# Patient Record
Sex: Female | Born: 1945 | Race: White | Hispanic: No | Marital: Married | Smoking: Former smoker
Health system: Southern US, Community
[De-identification: ages and names within clinical notes are randomized; demographics above are authoritative.]

## PROBLEM LIST (undated history)

## (undated) ENCOUNTER — Emergency Department (HOSPITAL_COMMUNITY): Admission: EM | Disposition: A | Discharge status: Home / Self Care | Payer: 59

## (undated) DIAGNOSIS — G4730 Sleep apnea, unspecified: Secondary | ICD-10-CM

## (undated) DIAGNOSIS — D4959 Neoplasm of unspecified behavior of other genitourinary organ: Secondary | ICD-10-CM

## (undated) DIAGNOSIS — J45909 Unspecified asthma, uncomplicated: Secondary | ICD-10-CM

## (undated) DIAGNOSIS — L309 Dermatitis, unspecified: Secondary | ICD-10-CM

## (undated) DIAGNOSIS — M1990 Unspecified osteoarthritis, unspecified site: Secondary | ICD-10-CM

## (undated) DIAGNOSIS — D219 Benign neoplasm of connective and other soft tissue, unspecified: Secondary | ICD-10-CM

## (undated) DIAGNOSIS — E119 Type 2 diabetes mellitus without complications: Secondary | ICD-10-CM

## (undated) DIAGNOSIS — D250 Submucous leiomyoma of uterus: Secondary | ICD-10-CM

## (undated) DIAGNOSIS — I10 Essential (primary) hypertension: Secondary | ICD-10-CM

## (undated) HISTORY — PX: PELVIC LAPAROSCOPY: SHX162

## (undated) HISTORY — DX: Sleep apnea, unspecified: G47.30

## (undated) HISTORY — PX: HYSTEROSCOPY: SHX211

## (undated) HISTORY — DX: Unspecified osteoarthritis, unspecified site: M19.90

## (undated) HISTORY — DX: Unspecified asthma, uncomplicated: J45.909

## (undated) HISTORY — DX: Dermatitis, unspecified: L30.9

## (undated) HISTORY — DX: Submucous leiomyoma of uterus: D25.0

## (undated) HISTORY — DX: Neoplasm of unspecified behavior of other genitourinary organ: D49.59

## (undated) HISTORY — DX: Benign neoplasm of connective and other soft tissue, unspecified: D21.9

## (undated) HISTORY — PX: HERNIA REPAIR: SHX51

## (undated) HISTORY — PX: DERMOID CYST  EXCISION: SHX1452

## (undated) HISTORY — DX: Type 2 diabetes mellitus without complications: E11.9

## (undated) HISTORY — DX: Essential (primary) hypertension: I10

---

## 2000-03-27 ENCOUNTER — Other Ambulatory Visit: Admission: RE | Admit: 2000-03-27 | Discharge: 2000-03-27 | Payer: Self-pay | Admitting: Obstetrics and Gynecology

## 2000-06-25 ENCOUNTER — Ambulatory Visit (HOSPITAL_COMMUNITY): Admission: RE | Admit: 2000-06-25 | Discharge: 2000-06-25 | Payer: Self-pay | Admitting: Obstetrics and Gynecology

## 2000-06-25 ENCOUNTER — Encounter (INDEPENDENT_AMBULATORY_CARE_PROVIDER_SITE_OTHER): Payer: Self-pay | Admitting: Specialist

## 2001-04-10 ENCOUNTER — Other Ambulatory Visit: Admission: RE | Admit: 2001-04-10 | Discharge: 2001-04-10 | Payer: Self-pay | Admitting: Obstetrics and Gynecology

## 2002-05-01 ENCOUNTER — Other Ambulatory Visit: Admission: RE | Admit: 2002-05-01 | Discharge: 2002-05-01 | Payer: Self-pay | Admitting: Obstetrics and Gynecology

## 2003-05-10 ENCOUNTER — Other Ambulatory Visit: Admission: RE | Admit: 2003-05-10 | Discharge: 2003-05-10 | Payer: Self-pay | Admitting: Obstetrics and Gynecology

## 2004-06-07 ENCOUNTER — Other Ambulatory Visit: Admission: RE | Admit: 2004-06-07 | Discharge: 2004-06-07 | Payer: Self-pay | Admitting: Obstetrics and Gynecology

## 2004-06-12 ENCOUNTER — Ambulatory Visit (HOSPITAL_COMMUNITY): Admission: RE | Admit: 2004-06-12 | Discharge: 2004-06-12 | Payer: Self-pay | Admitting: Family Medicine

## 2005-06-08 ENCOUNTER — Other Ambulatory Visit: Admission: RE | Admit: 2005-06-08 | Discharge: 2005-06-08 | Payer: Self-pay | Admitting: Addiction Medicine

## 2005-07-05 ENCOUNTER — Encounter: Admission: RE | Admit: 2005-07-05 | Discharge: 2005-07-05 | Payer: Self-pay | Admitting: Obstetrics and Gynecology

## 2005-09-07 ENCOUNTER — Ambulatory Visit (HOSPITAL_COMMUNITY): Admission: RE | Admit: 2005-09-07 | Discharge: 2005-09-07 | Payer: Self-pay | Admitting: Endocrinology

## 2005-10-12 ENCOUNTER — Encounter: Payer: Self-pay | Admitting: Obstetrics and Gynecology

## 2005-10-16 ENCOUNTER — Inpatient Hospital Stay (HOSPITAL_COMMUNITY): Admission: AD | Admit: 2005-10-16 | Discharge: 2005-10-17 | Payer: Self-pay | Admitting: Obstetrics and Gynecology

## 2005-10-16 ENCOUNTER — Encounter (INDEPENDENT_AMBULATORY_CARE_PROVIDER_SITE_OTHER): Payer: Self-pay | Admitting: *Deleted

## 2006-06-11 ENCOUNTER — Other Ambulatory Visit: Admission: RE | Admit: 2006-06-11 | Discharge: 2006-06-11 | Payer: Self-pay | Admitting: Obstetrics and Gynecology

## 2006-09-13 ENCOUNTER — Encounter (INDEPENDENT_AMBULATORY_CARE_PROVIDER_SITE_OTHER): Payer: Self-pay | Admitting: Specialist

## 2006-09-13 ENCOUNTER — Ambulatory Visit (HOSPITAL_COMMUNITY): Admission: RE | Admit: 2006-09-13 | Discharge: 2006-09-14 | Payer: Self-pay | Admitting: Obstetrics and Gynecology

## 2007-06-16 ENCOUNTER — Other Ambulatory Visit: Admission: RE | Admit: 2007-06-16 | Discharge: 2007-06-16 | Payer: Self-pay | Admitting: Obstetrics and Gynecology

## 2010-12-24 ENCOUNTER — Encounter: Payer: Self-pay | Admitting: Endocrinology

## 2011-04-03 ENCOUNTER — Ambulatory Visit (INDEPENDENT_AMBULATORY_CARE_PROVIDER_SITE_OTHER): Payer: 59 | Admitting: Obstetrics and Gynecology

## 2011-04-03 ENCOUNTER — Other Ambulatory Visit: Payer: Self-pay | Admitting: Obstetrics and Gynecology

## 2011-04-03 ENCOUNTER — Other Ambulatory Visit (HOSPITAL_COMMUNITY)
Admission: RE | Admit: 2011-04-03 | Discharge: 2011-04-03 | Disposition: A | Discharge status: Home / Self Care | Payer: 59 | Source: Ambulatory Visit | Attending: Obstetrics and Gynecology | Admitting: Obstetrics and Gynecology

## 2011-04-03 DIAGNOSIS — Z124 Encounter for screening for malignant neoplasm of cervix: Secondary | ICD-10-CM | POA: Insufficient documentation

## 2011-04-03 DIAGNOSIS — Z01419 Encounter for gynecological examination (general) (routine) without abnormal findings: Secondary | ICD-10-CM

## 2011-04-20 NOTE — Op Note (Signed)
Amy Riddle, Amy Riddle          ACCOUNT NO.:  [id number]   MEDICAL RECORD NO.:  [id number]          PATIENT TYPE:  AMB   LOCATION:  DAY                          FACILITY:  [facility]   PHYSICIAN:  Sharlet Salina T. Hoxworth, M.D.DATE OF BIRTH:  07/10/1946   DATE OF PROCEDURE:  09/13/2006  DATE OF DISCHARGE:                                 OPERATIVE REPORT   PREOPERATIVE DIAGNOSIS:  Ventral incisional hernia.   POSTOPERATIVE DIAGNOSIS:  Ventral incisional hernia.   SURGICAL PROCEDURES:  Laparoscopic repair of ventral incisional hernia.   SURGEON:  Dr. Johna Sheriff   ANESTHESIA:  General.   BRIEF HISTORY:  Amy Riddle is a 65 year old female with morbid obesity,  who is status post laparoscopic assisted oophorectomy in the last year.  Amy Riddle  now presents with an enlarging hernia mass at her Hasson trocar site just  above the umbilicus.  In addition, Amy Riddle has a moderate-sized umbilical hernia  just below Amy Riddle.  It is increasingly symptomatic, and we have recommended  laparoscopic repair.  The nature of the procedure; indications; risks  bleeding, infection, recurrence, bowel injury were discussed and understood.  Amy Riddle is now brought to the operating room for Amy Riddle procedure.   DESCRIPTION OF OPERATION:  Following completion of a D&C by Dr. Eda Paschal,  the abdomen was widely sterilely prepped and draped.  Amy Riddle received  preoperative IV antibiotics.  PAS were in place.  Correct patient and  procedure were verified.  Abdominal access was obtained with 11 mm OptiVu  trocar in the left flank without difficulty and pneumoperitoneum  established.  Under direct vision, two 5 mm trocars were placed in the left  upper quadrant and the left lower quadrant.  There was obvious tissue  incarcerated up into these hernias around the umbilicus and just above.  Using the harmonic scalpel superiorly, the hernia sac was carefully opened  at the edge of the peritoneum and the more superior larger hernia was found  to  contain several feet of small intestine that was easily brought down back  into the abdomen.  Further adhesions of fibrofatty tissue were taken down  around Amy Riddle edge and then omentum was reduced from the umbilical defect  immediately below Amy Riddle, further taken down with harmonic scalpel and the  entire anterior abdominal wall completely cleared.  Again noted were 2  adjacent defects, the superior one being somewhat larger at the incision  site, overall together measured about 7 x 4 cm.  A piece of Excel dual mesh  measuring 15 x 10 cm was chosen.  Four stay sutures were placed at the 12,  3, 6, and 9 o'clock positions.  Amy Riddle was introduced in the abdomen and  unfurled.  A centering suture had also been placed which was retrieved up  through the center of the hernia defect.  Four corresponding sites on the  peritoneum upon the abdominal wall were identified for the stay sutures and  using a suture grasper through small stab incisions, these were brought up  to the anterior abdominal wall with very nice wide deployment of the mesh  with excellent coverage and overlapped.  The sutures were tied  in place.  The Endo tacker was then used to secure the mesh circumferentially  with the second row more centrally.  Amy Riddle appeared to provide nice broad  coverage.  CO2 was evacuated and all trocars removed.  The sutures were  trimmed.  Skin incisions were closed with interrupted subcuticular 4-0  Monocryl and Steri-Strips.  Sponge, needle, and instrument counts correct.  The patient taken to recovery in good condition.      Lorne Skeens. Hoxworth, M.D.  Electronically Signed     BTH/MEDQ  D:  09/13/2006  T:  09/15/2006  Job:  [id number]

## 2011-04-20 NOTE — Discharge Summary (Signed)
Amy Riddle, Amy Riddle          ACCOUNT NO.:  [id number]   MEDICAL RECORD NO.:  [id number]          PATIENT TYPE:  INP   LOCATION:  9306                          FACILITY:  [facility]   PHYSICIAN:  Daniel L. Gottsegen, M.D.DATE OF BIRTH:  1946-05-28   DATE OF ADMISSION:  10/16/2005  DATE OF DISCHARGE:  10/17/2005                                 DISCHARGE SUMMARY   The patient is a 65 year old nulligravida, who presented to the office with  significantly increased hair growth.  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  Ultrasound and MRI  failed to reveal the source of the tumor.  Selective vein sampling for  testosterone levels confirm that this was an ovarian neoplasm, and she was  brought to the operating for laparoscopy with bilateral salpingo-  oophorectomy by Dr. Eda Paschal with Dr. Jamse Mead help because of her large  size, which was approximately 350 pounds.  The patient underwent a bilateral  salpingo-oophorectomy.  Surgery was successful laparoscopically, but she was  kept overnight to be sure that she did not have any problems, and on the  next day she was discharged.   DISCHARGE MEDICATIONS:  Vicodin for pain relief.   She will be seen in the office in four days for staple removal.   DISCHARGE DIET:  Regular.   DISCHARGE ACTIVITIES:  Ambulatory.   DISCHARGE CONDITION:  Improved.   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   OPERATION:  Laparoscopy with bilateral salpingo-oophorectomy.      Daniel L. Eda Paschal, M.D.  Electronically Signed     DLG/MEDQ  D:  11/08/2005  T:  11/08/2005  Job:  [id number]

## 2011-04-20 NOTE — Op Note (Signed)
[facility]   Patient:    Amy Riddle, Amy Riddle                 MRN: [id number] Proc. Date: 06/25/00 Adm. Date:  [id number] Disc. Date: [id number] Attending:  Amanda Cockayne CC:         Esmeralda Arthur, M.D.                           Operative Report  OPERATION:                    1. Resection of large endometrial fibroid.                               2. Hysteroscopy.  SURGEON:                      Esmeralda Arthur, M.D.  ANESTHESIA:                   General.  MEDIUM:                       Sorbitol.  DEFICIT:                      50 cc.  CATHETERS:                    None.  PACKS:                       None.  FINDINGS:                     Endometrial cavity which sounded 12.0 cm.  She had a large fibroid occupying the endometrial cavity.  She had a progressive resection. I then explored in further and I think we perforated the uterus, and did not explore it further.  We stopped the procedure.  She had no excess bleeding after watching for four minutes.  Her deficit was 50 cc.  DESCRIPTION OF PROCEDURE:     The patient was carried to the operating room and had satisfactory general anesthesia.  She was placed in the lithotomy position. She was prepped and draped in the sterile field.  The bladder was catheterized. A weighted speculum was placed in the posterior vagina.  The cervix could not be easily visualized.  A long weighted speculum had to be used.  The cervix was then grasped and the cervix dilated easily to a #25.  We put the scope in.  On observation, we saw a large fibroid.  We dilated her fairly easily to a #33. There was a little pressure with the #33, so dilated the cervix.  We then inserted the hysteroscope and we could see the large fibroid.  This occupied most of the endometrial cavity.  We began a slow dissection with the cutting cautery, and carried down.  I think we basically removed 90% of the fibroid.   There was endoscopic bleeding, and looking saw an area where I thought I saw bowel.  We stopped the procedure.  We had a deficit of 50 cc.  She had no excessive bleeding after the procedure.  The patient was then carried to the recovery room in good condition.  The patient had been counseled preoperatively in reference to possible perforation,  and infection, and understands. DD:  06/25/00 TD:  06/27/00 Job: [id number]

## 2011-04-20 NOTE — H&P (Signed)
Amy Riddle, Amy Riddle          ACCOUNT NO.:  [id number]   MEDICAL RECORD NO.:  [id number]          PATIENT TYPE:  AMB   LOCATION:  DAY                          FACILITY:  [facility]   PHYSICIAN:  Daniel L. Gottsegen, M.D.DATE OF BIRTH:  May 08, 1946   DATE OF ADMISSION:  10/16/2005  DATE OF DISCHARGE:                                HISTORY & PHYSICAL   CHIEF COMPLAINT:  Increased hair growth.   HISTORY OF PRESENT ILLNESS:  The patient is a 65 year old nulligravida, who  came to see me because of noticing increase of hair growth, both on her face  and on her abdomen.  On examination, there was significant hair growth.  As  a result of this, a testosterone was done which was significantly elevated.  She then had an ultrasound looking for an ovarian source of her  significantly increased hair growth.  Ultrasound was not helpful.  She then  underwent an MRI which did show a slight difference in size between the  right and the left ovary, although no absolute tumor could be seen.  Her  DHEAS was normal which also led [place] to believe that the source of her  increased testosterone was a tumor of the ovary.  Because of her weight and  concern about operating on someone even with a very elevated testosterone  without having documentation, she was referred to Dr. Dagoberto Ligas who went ahead  and had her studied for selective vein, both ovary and adrenal selective  vein sampling for testosterone levels, and studies confirmed that the source  of her increased testosterone was her ovaries.  As a result of this, she now  enters the hospital for a bilateral salpingo-oophorectomy.  There will be an  attempt to do this laparoscopically; however, she knows that she may require  an incision.  We have decided to remove both her ovaries because she is  postmenopausal and when selective vein sampling was done, they could not get  into the right ovarian vein, so there is still some question as to whether  the source  of the increased hair growth is her right or left ovary, but it  is clearly ovarian.   PAST MEDICAL HISTORY:  She had a dermoid removed by Dr. Verlon Au many  years ago.  It was on her right ovary, and a cystectomy was done.  She then  developed an incisional hernia.  Those are her only surgeries.   PRESENT MEDICATIONS:  Protopic for eczema, vitamin E, Advair for her asthma,  calcium and ibuprofen for arthritis.   MEDICAL PROBLEMS:  As noted are asthma and eczema.   FAMILY HISTORY:  She has an uncle who is diabetic.  She has a brother and a  grandmother that are hypertensive.  Her maternal aunt or cousin had breast  cancer, and her father had pancreatic cancer.   REVIEW OF SYSTEMS:  HEENT:  Negative except for headaches.  CARDIOVASCULAR:  Negative.  RESPIRATORY:  Negative.  GI:  Negative.  MUSCULOSKELETAL:  Negative.  GU:  Negative.  NEUROLOGICAL AND PSYCHIATRIC:  Negative.  ALLERGIC/IMMUNOLOGICAL/LYMPHATIC/ENDOCRINE:  Negative except as noted with  the hirsutism.  PHYSICAL EXAMINATION:  GENERAL:  The patient is a well-developed but very  heavy white female in no acute distress.  VITAL SIGNS:  Blood pressure is 140/84.  Pulse is 80 and regular.  Respirations 16 and nonlabored.  She is afebrile.  HEENT:  All within normal limits.  NECK:  Supple, trachea in the midline.  Thyroid is not enlarged.  LUNGS:  Clear to P&A.  HEART:  No thrills, heaves, or murmurs.  BREASTS:  No masses.  ABDOMEN:  Soft without guarding, rebound or masses.  PELVIC:  External and vaginal are normal.  Cervix is clean.  Pap smear shows  no atypia.  Pelvic examination is very limited because of her size.  No  masses are appreciated, either on the uterus or her adnexa.  Rectovaginal is  confirmatory.  EXTREMITIES:  Within normal limits.   ADMISSION IMPRESSION:  Ovarian neoplasm producing testosterone.   PLAN:  See above.      Daniel L. Eda Paschal, M.D.  Electronically Signed     DLG/MEDQ  D:   10/17/2005  T:  10/17/2005  Job:  [id number]

## 2011-04-20 NOTE — Op Note (Signed)
NAMECARMAN, AUXIER          ACCOUNT NO.:  [id number]   MEDICAL RECORD NO.:  [id number]          PATIENT TYPE:  INP   LOCATION:  9306                          FACILITY:  [facility]   PHYSICIAN:  Daniel L. Gottsegen, M.D.DATE OF BIRTH:  1946/08/28   DATE OF PROCEDURE:  10/16/2005  DATE OF DISCHARGE:                                 OPERATIVE REPORT   PREOPERATIVE DIAGNOSIS:  Ovarian neoplasm producing testosterone.   POSTOPERATIVE DIAGNOSIS:  Ovarian neoplasm producing testosterone.   OPERATIONS:  Diagnostic laparoscopy, followed by lysis adhesions, followed  by bilateral salpingo-oophorectomy.   SURGEON:  Dr. Eda Paschal   FIRST ASSISTANT:  Dr. Johna Sheriff   INDICATIONS:  The patient is a 65 year old female, who had presented to the  office with increased hair growth.  The patient's testosterone level was in  the tumor level; however, on screening, no definitive neoplasm could be  seen, although on MR she had a slightly enlarged right ovary.  As a result  of this, selective vein sampling was done confirming that this was an  ovarian producing tumor and not an adrenal producing tumor.  As a result of  this, she is taken to the operating room for excision of both ovaries.  It  was impossible to be sure whether this was from her right ovary or left  ovary because both ovarian veins could not be sampled, just the left ovarian  vein and above and below the vena cava.   FINDINGS:  At the time of surgery, the patient had omental adhesions from  her previous pelvic surgery.  Once these were taken down, her uterus was  slightly enlarged and boggy, consistent with either a small leiomyoma or  adenomyosis.  The right ovary was enlarged, especially for a postmenopausal  ovary.  It had a smooth surface, but it appeared to have something in it.  The left ovary was also just slightly enlarged, not as enlarged as her right  ovary was.  The fallopian tubes were normal.  Pelvic peritoneum was free of  any disease.   PROCEDURE:  After adequate general endotracheal anesthesia, the patient was  placed in the supine position, prepped and draped in the usual sterile  manner.  A Hulka catheter was inserted in the patient's uterus.  A Foley  catheter was inserted in the patient's bladder.  Dr. Johna Sheriff had scrubbed  in because of his experience with operating on significantly overweight  women with need for laparoscopic surgery.  Choices considered were either  using the OptiVu in the left upper quadrant or doing an open laparoscopy  above the umbilicus, and we elected to do the latter.  She had what appeared  to be an umbilical hernia, and it was felt better to go above that side for  that reason.  A vertical incision was made.  It was taken down to the fascia  which was grasped.  The fascia was opened.  The peritoneum was entered  without difficulty.  A 0 Vicryl pursestring suture was placed to keep the  [facility] catheter in place, and then the diagnostic laparoscope was put  through that and a pneumoperitoneum was  created.  Initially, two 5 mm ports  were placed but because of the difficulty in keeping bowel way from the  adnexal structures and also elevating the uterus and the adnexal structures  enough to see them, she ended up having four 5 mm ports placed in addition  to the one placed above the umbilicus in the midline; one was in the left  lower quadrant; one was to the right of the umbilicus; one was in the  midline suprapubically, and one was in the right lower quadrant.  A variety  of instrumentation was placed through this.  Peritoneal washings were  obtained.  Using a grasper to keep the omentum on stretch and using the  scissors with unilateral current, all adhesions were taken down without  difficulty.  This allowed [place] to see the pelvis much better.  Bleeding was  controlled with a bipolar.  Following this, the right adnexa could be  identified, although it took some work to get  it elevated enough away from  bowel, but it was done, although we could not identify the ureter on the  right.  Using a tripolar staying very close the ovary and hugging it, was  felt that we were far from the ureter.  Bipolaring and cutting, the right  ovary and tube were separated both from the IP ligament and the uterus  without any bleeding being noted.  Attention was next turned to left side,  and the left ovary and tube were handled in a similar fashion.  Once again  the ureter could not be identified, but the ovary was hugged all the way  down, and it was felt it was not injured.  Both adnexa were now free without  bleeding.  Copious irrigation was done with sterile saline, and no bleeding  was found.  The urine remained clear and copious.  An Endopouch was placed  through the 10 mm incision, and both ovaries and tubes were removed and sent  to pathology for tissue diagnosis.  It clearly looked like her tumor was on  the right ovary, and then the pneumoperitoneum was evacuated.  The Hasson 0  Vicryl suture was tied in place to prevent a hernia, and then staples were  used on the other incisions except for the one in the left lower quadrant.  There was some fairly significant venous bleeding from that.  A large needle  with a 2-0 plain was used in a figure-of-eight, stopped the bleeding  completely, and that will be kept in place for a while.  Estimated blood  loss for the entire procedure was 100 mL with none replaced.  The patient  tolerated the procedure well, left the operating room in satisfactory  condition draining clear urine from her Foley catheter.      Daniel L. Eda Paschal, M.D.  Electronically Signed     DLG/MEDQ  D:  10/16/2005  T:  10/16/2005  Job:  [id number]

## 2011-04-20 NOTE — Op Note (Signed)
[name], Amy Riddle          ACCOUNT NO.:  [id number]   MEDICAL RECORD NO.:  [id number]          PATIENT TYPE:  AMB   LOCATION:  DAY                          FACILITY:  [facility]   PHYSICIAN:  Daniel L. Gottsegen, M.D.DATE OF BIRTH:  [birth year]   DATE OF PROCEDURE:  09/13/2006  DATE OF DISCHARGE:                                 OPERATIVE REPORT   PREOPERATIVE DIAGNOSIS:  Postmenopausal bleeding.   POSTOPERATIVE DIAGNOSIS:  Post menopausal bleeding plus submucous myoma.   OPERATION:  Hysteroscopy with endometrial sampling.   SURGEON:  Dr. Eda Paschal   ANESTHESIA:  General.   INDICATIONS:  The patient is a 65 year old nulligravida, who had presented  to the office with postmenopausal bleeding.  She had had 2 episodes at that  point, and she has had another once since.  She underwent an ultrasound in  the office.  She had some small fibroids, but they did obscure the  endometrial cavity, so we could not measure endometrial stripe, and it was  not possible to do endometrial sampling in the office due to cervical  stenosis.  She now enters the hospital for the above.   FINDINGS:  Externals normal.  BUS is normal.  Vaginal is normal.  Cervix is  clean.  Uterus appears to be top normal size and shape.  You really could  not evaluate her adnexa because of her size, but she is status post  laparoscopic BSO.   PROCEDURE:  After adequate general anesthesia, the patient was placed in the  dorsal supine position, prepped and draped in the usual sterile manner.  A  single-tooth tenaculum was placed in the anterior lip of the cervix.  The  patient was dilated to #23 [facility] dilator.  Endometrial sampling was done  with a Pipelle curette, and then the diagnostic hysteroscope was introduced.  Three percent sorbitol was used to expand the intrauterine cavity, and a  camera was used for magnification.  The patient did have a submucous myoma  at the top of the fundus; however, it was not confined  to the endometrial  cavity, and a younger person who was still menstruating and whose size was  not as much of a surgical challenge, some thought might have been taken to  resecting it and hoping to be able to resect it into the wall of the  myometrium but because of fear of bleeding that would require laparotomy to  control and her postmenopausal state and the fact it was a benign-looking  submucous myoma not confined to the cavity, it was felt that the prudent  choice was to leave it.  The procedure was terminated.  Blood loss was  minimal; fluid deficit was minimal.  Following this, Dr. Johna Sheriff was going  to do an incisional hernia repair.      Daniel L. Eda Paschal, M.D.  Electronically Signed    DLG/MEDQ  D:  09/13/2006  T:  09/15/2006  Job:  [id number]

## 2013-03-19 ENCOUNTER — Encounter: Payer: Self-pay | Admitting: Anesthesiology

## 2013-03-23 ENCOUNTER — Encounter: Payer: 59 | Admitting: Gynecology

## 2013-03-26 ENCOUNTER — Encounter: Payer: Self-pay | Admitting: Anesthesiology

## 2013-03-26 ENCOUNTER — Encounter: Payer: Self-pay | Admitting: Gynecology

## 2013-03-26 ENCOUNTER — Ambulatory Visit (INDEPENDENT_AMBULATORY_CARE_PROVIDER_SITE_OTHER): Payer: 59 | Admitting: Gynecology

## 2013-03-26 VITALS — BP 128/80 | Ht 63.0 in | Wt 304.0 lb

## 2013-03-26 DIAGNOSIS — Z79890 Hormone replacement therapy (postmenopausal): Secondary | ICD-10-CM

## 2013-03-26 DIAGNOSIS — D259 Leiomyoma of uterus, unspecified: Secondary | ICD-10-CM

## 2013-03-26 DIAGNOSIS — E119 Type 2 diabetes mellitus without complications: Secondary | ICD-10-CM

## 2013-03-26 DIAGNOSIS — E663 Overweight: Secondary | ICD-10-CM

## 2013-03-26 DIAGNOSIS — I10 Essential (primary) hypertension: Secondary | ICD-10-CM

## 2013-03-26 DIAGNOSIS — R232 Flushing: Secondary | ICD-10-CM

## 2013-03-26 MED ORDER — PAROXETINE HCL 10 MG PO TABS: ORAL_TABLET | ORAL | Status: DC

## 2013-03-26 NOTE — Progress Notes (Signed)
Amy Riddle [birth year] [id number]   History:    67 y.o.  for annual gyn exam who has not been seen in the office since 2012. Patient with past history laparoscopic bilateral salpingo-oophorectomy with the following pathology report:  1. 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 SEE COMMENT. - FALLOPIAN TUBE: BENIGN FALLOPIAN TUBE.  2. LEFT OVARY AND FALLOPIAN TUBE: - OVARY: BENIGN OVARY WITH SURFACE ADHESIONS. - FALLOPIAN TUBE: BENIGN FALLOPIAN TUBE.  Patient several years prior she had bilateral ovarian cystectomy for dermoid cyst.  Patient is morbidly obese has been followed by Dr. Jeannetta Nap for her type 2 diabetes and hypertension and has been doing her lab work. Review of her record also indicated that in 2001 she had resectoscopic myomectomy. She does have history of fibroid uterus. Patient also has had incisional hernia repairs x2. Her last mammogram was in 2012. Patient has not had a colonoscopy as of yet. She was complaining today of vasomotor symptoms. She was taking estrogen over-the-counter and she stopped that several months ago. She does not like to take any medication.   Past medical history,surgical history, family history and social history were all reviewed and documented in the EPIC chart.  Gynecologic History No LMP recorded. Patient is postmenopausal. Contraception: post menopausal status Last Pap: 2012. Results were: normal Last mammogram: 2012. Results were: normal  Obstetric History OB History   Grav Para Term Preterm Abortions TAB SAB Ect Mult Living   0                ROS: A ROS was performed and pertinent positives and negatives are included in the history.  GENERAL: No fevers or chills. HEENT: No change in vision, no earache, sore throat or sinus congestion. NECK: No pain or stiffness. CARDIOVASCULAR: No chest pain or pressure. No palpitations. PULMONARY: No shortness of breath, cough or wheeze. GASTROINTESTINAL: No  abdominal pain, nausea, vomiting or diarrhea, melena or bright red blood per rectum. GENITOURINARY: No urinary frequency, urgency, hesitancy or dysuria. MUSCULOSKELETAL: No joint or muscle pain, no back pain, no recent trauma. DERMATOLOGIC: No rash, no itching, no lesions. ENDOCRINE: No polyuria, polydipsia, no heat or cold intolerance. No recent change in weight. HEMATOLOGICAL: No anemia or easy bruising or bleeding. NEUROLOGIC: No headache, seizures, numbness, tingling or weakness. PSYCHIATRIC: No depression, no loss of interest in normal activity or change in sleep pattern.     Exam: chaperone present  BP 128/80  Ht 5\' 3"  (1.6 m)  Wt 304 lb (137.893 kg)  BMI 53.86 kg/m2  Body mass index is 53.86 kg/(m^2).  General appearance : Well developed well nourished female. No acute distress HEENT: Neck supple, trachea midline, no carotid bruits, no thyroidmegaly Lungs: Clear to auscultation, no rhonchi or wheezes, or rib retractions  Heart: Regular rate and rhythm, no murmurs or gallops Breast:Examined in sitting and supine position were symmetrical in appearance, no palpable masses or tenderness,  no skin retraction, no nipple inversion, no nipple discharge, no skin discoloration, no axillary or supraclavicular lymphadenopathy Abdomen: no palpable masses or tenderness, no rebound or guarding Extremities: no edema or skin discoloration or tenderness  Pelvic:  Bartholin, Urethra, Skene Glands: Within normal limits             Vagina: No gross lesions or discharge  Cervix: No gross lesions or discharge  Uterus  Limited exam due to patient's abdominal girth, normal size  Adnexa limited due to patient's abdominal girth Anus and perineum  normal  Rectovaginal  normal sphincter tone without palpated masses or tenderness             Hemoccult Hemoccult cards will be provided at next visit     Assessment/Plan:  67 y.o. female for annual exam morbidly obese making it difficult to evaluate her  uterus. Patient with past history of fibroid uterus. She will return back to the office in one to 2 weeks for an ultrasound. She scheduled for mammogram next month and will have her bone density done at the same time. Her shingles, Tdap and Pneumovax vaccine are up-to-date. We are going to give her the name of gastroenterologist for her to schedule a colonoscopy. For vasomotor symptoms she is going to be placed on Paxil 10 mg which she will break half a tablet and take only half a tablet before bedtime. She can start back on the estroven if she wants to. We discussed importance of calcium vitamin D and regular exercise for osteoporosis prevention. No Pap smear done today. Her last Pap smear will be next year.    Ok Edwards MD, 12:07 PM 03/26/2013

## 2013-03-26 NOTE — Patient Instructions (Addendum)

## 2013-03-27 ENCOUNTER — Encounter: Payer: Self-pay | Admitting: Obstetrics and Gynecology

## 2013-04-22 ENCOUNTER — Ambulatory Visit (INDEPENDENT_AMBULATORY_CARE_PROVIDER_SITE_OTHER): Payer: 59 | Admitting: Gynecology

## 2013-04-22 ENCOUNTER — Encounter: Payer: Self-pay | Admitting: Gynecology

## 2013-04-22 ENCOUNTER — Ambulatory Visit (INDEPENDENT_AMBULATORY_CARE_PROVIDER_SITE_OTHER): Payer: 59

## 2013-04-22 DIAGNOSIS — N950 Postmenopausal bleeding: Secondary | ICD-10-CM

## 2013-04-22 DIAGNOSIS — L919 Hypertrophic disorder of the skin, unspecified: Secondary | ICD-10-CM

## 2013-04-22 DIAGNOSIS — D251 Intramural leiomyoma of uterus: Secondary | ICD-10-CM

## 2013-04-22 DIAGNOSIS — N852 Hypertrophy of uterus: Secondary | ICD-10-CM

## 2013-04-22 DIAGNOSIS — E663 Overweight: Secondary | ICD-10-CM

## 2013-04-22 DIAGNOSIS — D259 Leiomyoma of uterus, unspecified: Secondary | ICD-10-CM

## 2013-04-22 DIAGNOSIS — E65 Localized adiposity: Secondary | ICD-10-CM

## 2013-04-22 NOTE — Patient Instructions (Addendum)
Endometrial Biopsy This is a test in which a tissue sample (a biopsy) is taken from inside the uterus (womb). It is then looked at by a specialist under a microscope to see if the tissue is normal or abnormal. The endometrium is the lining of the uterus. This test helps determine where you are in your menstrual cycle and how hormone levels are affecting the lining of the uterus. Another use for this test is to diagnose endometrial cancer, tuberculosis, polyps, or inflammatory conditions and to evaluate uterine bleeding. PREPARATION FOR TEST No preparation or fasting is necessary. NORMAL FINDINGS No pathologic conditions. Presence of "secretory-type" endometrium 3 to 5 days before to normal menstruation. Ranges for normal findings may vary among different laboratories and hospitals. You should always check with your doctor after having lab work or other tests done to discuss the meaning of your test results and whether your values are considered within normal limits. MEANING OF TEST  Your caregiver will go over the test results with you and discuss the importance and meaning of your results, as well as treatment options and the need for additional tests if necessary. OBTAINING THE TEST RESULTS It is your responsibility to obtain your test results. Ask the lab or department performing the test when and how you will get your results. Document Released: 03/22/2005 Document Revised: 02/11/2012 Document Reviewed: 10/29/2008 [employer] Patient Information 2014 [employer].  

## 2013-04-22 NOTE — Progress Notes (Signed)
The patient presented to the office today for an ultrasound since she was seen the office on April 24 limiting full pelvic exam due to the fact the patient is morbidly obese. Patient with past history laparoscopic bilateral salpingo-oophorectomy with the following pathology report:  1. 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 SEE COMMENT. - FALLOPIAN TUBE: BENIGN FALLOPIAN TUBE.  2. LEFT OVARY AND FALLOPIAN TUBE: - OVARY: BENIGN OVARY WITH SURFACE ADHESIONS. - FALLOPIAN TUBE: BENIGN FALLOPIAN TUBE.  Patient several years prior she had bilateral ovarian cystectomy for dermoid cyst.   Patient with known history of fibroid uterus. Patient's last ultrasound was in 2008.  Patient's informing every 4 months she has some brownish discharge but has not told anyone until now.  Ultrasound today: Uterus measured 11.5 x 10.7 x 3.1 cm with endometrial stripe of 2.7 mm. Suboptimal images due to pendulous abdomen. Intramural fibroid measuring 5.2 x 4.3 cm, 5.4 x 4.7 cm cervical fibroid measuring 2.3 x 2.8 cm questionable endometrial cavity seemed lower uterine segment with fluid within the cavity. A vascular. Unable to identify the endometrium in the fundus.  Assessment/plan review from previous ultrasound demonstrates no change in fibroid size. I had recommended we do an endometrial biopsy due to the fact that she had inform me that she's had some spotting every 3-4 months. Patient states she would like to return next month to have the endometrial biopsy. Patient was reminded to schedule her overdue colonoscopy since she's never had one and also her bone density study.

## 2013-05-04 ENCOUNTER — Encounter: Payer: Self-pay | Admitting: Gynecology

## 2013-05-05 ENCOUNTER — Encounter: Payer: Self-pay | Admitting: Gynecology

## 2013-05-15 ENCOUNTER — Encounter: Payer: Self-pay | Admitting: Gynecology

## 2013-05-15 ENCOUNTER — Ambulatory Visit (INDEPENDENT_AMBULATORY_CARE_PROVIDER_SITE_OTHER): Payer: 59 | Admitting: Gynecology

## 2013-05-15 DIAGNOSIS — N952 Postmenopausal atrophic vaginitis: Secondary | ICD-10-CM

## 2013-05-15 DIAGNOSIS — N882 Stricture and stenosis of cervix uteri: Secondary | ICD-10-CM

## 2013-05-15 DIAGNOSIS — N950 Postmenopausal bleeding: Secondary | ICD-10-CM

## 2013-05-15 MED ORDER — NONFORMULARY OR COMPOUNDED ITEM: Status: DC

## 2013-05-15 NOTE — Patient Instructions (Addendum)

## 2013-05-15 NOTE — Progress Notes (Signed)
Patient is a 67 year old who was seen in the office in April of this year for her annual exam but pelvic exam was limited due to the patient's BMI (morbidly obese). Patient's ultrasound demonstrated the following:  Uterus measured 11.5 x 10.7 x 3.1 cm with endometrial stripe of 2.7 mm. Suboptimal images due to pendulous abdomen. Intramural fibroid measuring 5.2 x 4.3 cm, 5.4 x 4.7 cm cervical fibroid measuring 2.3 x 2.8 cm questionable endometrial cavity seemed lower uterine segment with fluid within the cavity. A vascular. Unable to identify the endometrium in the fundus.  Patient does have a past history of laparoscopic bilateral salpingo-oophorectomy with pathology report as follows:  1. 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 SEE COMMENT. - FALLOPIAN TUBE: BENIGN FALLOPIAN TUBE.  2. LEFT OVARY AND FALLOPIAN TUBE: - OVARY: BENIGN OVARY WITH SURFACE ADHESIONS. - FALLOPIAN TUBE: BENIGN FALLOPIAN TUBE.  Patient several years prior she had bilateral ovarian cystectomy for dermoid cyst.   She had voiced that occasionally she might note a slight brownish discharge from her vagina so she was asked to come in today for a possible endometrial biopsy.  An attempted endometrial biopsy was unsuccessful due to patient being uncomfortable and stenotic cervical R. So procedure was aborted. We discussed that possibly the slight pinkish discharge and she is noted may have been attributed to vaginal atrophy because her vaginal mucosa was friable contact. She is scheduled for a colonoscopy in the next few weeks. Since her endometrial stripe was less than 5 mm this is reassuring to follow closely. She will be started on estradiol 0.02% 1 mL prefilled applicators to apply intravaginally twice a week. If she has any further form of bleeding she will report to the office and we may need to consider outpatient diagnostic hysteroscopy and D&C. We'll rescan her in one year in measure her  endometrial stripe.

## 2015-02-28 ENCOUNTER — Other Ambulatory Visit (HOSPITAL_BASED_OUTPATIENT_CLINIC_OR_DEPARTMENT_OTHER): Payer: Self-pay | Admitting: Orthopaedic Surgery

## 2015-03-18 ENCOUNTER — Encounter (HOSPITAL_BASED_OUTPATIENT_CLINIC_OR_DEPARTMENT_OTHER): Payer: Self-pay | Admitting: *Deleted

## 2015-03-21 ENCOUNTER — Encounter (HOSPITAL_BASED_OUTPATIENT_CLINIC_OR_DEPARTMENT_OTHER)
Admission: RE | Admit: 2015-03-21 | Discharge: 2015-03-21 | Disposition: A | Discharge status: Home / Self Care | Payer: 59 | Source: Ambulatory Visit | Attending: Orthopaedic Surgery | Admitting: Orthopaedic Surgery

## 2015-03-21 ENCOUNTER — Other Ambulatory Visit: Payer: Self-pay

## 2015-03-21 DIAGNOSIS — G5602 Carpal tunnel syndrome, left upper limb: Secondary | ICD-10-CM | POA: Diagnosis present

## 2015-03-21 DIAGNOSIS — L309 Dermatitis, unspecified: Secondary | ICD-10-CM | POA: Diagnosis not present

## 2015-03-21 DIAGNOSIS — Z79899 Other long term (current) drug therapy: Secondary | ICD-10-CM | POA: Diagnosis not present

## 2015-03-21 DIAGNOSIS — I10 Essential (primary) hypertension: Secondary | ICD-10-CM | POA: Diagnosis not present

## 2015-03-21 DIAGNOSIS — E119 Type 2 diabetes mellitus without complications: Secondary | ICD-10-CM | POA: Diagnosis not present

## 2015-03-21 DIAGNOSIS — Z7952 Long term (current) use of systemic steroids: Secondary | ICD-10-CM | POA: Diagnosis not present

## 2015-03-21 DIAGNOSIS — Z87891 Personal history of nicotine dependence: Secondary | ICD-10-CM | POA: Diagnosis not present

## 2015-03-21 DIAGNOSIS — Z791 Long term (current) use of non-steroidal anti-inflammatories (NSAID): Secondary | ICD-10-CM | POA: Diagnosis not present

## 2015-03-21 DIAGNOSIS — J45909 Unspecified asthma, uncomplicated: Secondary | ICD-10-CM | POA: Diagnosis not present

## 2015-03-21 DIAGNOSIS — M1990 Unspecified osteoarthritis, unspecified site: Secondary | ICD-10-CM | POA: Diagnosis not present

## 2015-03-21 DIAGNOSIS — Z9889 Other specified postprocedural states: Secondary | ICD-10-CM | POA: Diagnosis not present

## 2015-03-21 DIAGNOSIS — Z6841 Body Mass Index (BMI) 40.0 and over, adult: Secondary | ICD-10-CM | POA: Diagnosis not present

## 2015-03-21 DIAGNOSIS — Z7951 Long term (current) use of inhaled steroids: Secondary | ICD-10-CM | POA: Diagnosis not present

## 2015-03-21 LAB — BASIC METABOLIC PANEL
ANION GAP: 12 (ref 5–15)
BUN: 12 mg/dL (ref 6–23)
CO2: 31 mmol/L (ref 19–32)
Calcium: 9.3 mg/dL (ref 8.4–10.5)
Chloride: 97 mmol/L (ref 96–112)
Creatinine, Ser: 0.7 mg/dL (ref 0.50–1.10)
GFR calc Af Amer: >90 mL/min (ref >90)
GFR, EST NON AFRICAN AMERICAN: 88 mL/min — AB (ref >90)
Glucose, Bld: 168 mg/dL — ABNORMAL HIGH (ref 70–99)
Potassium: 4.7 mmol/L (ref 3.5–5.1)
Sodium: 140 mmol/L (ref 135–145)

## 2015-03-21 NOTE — Progress Notes (Signed)
Dr. Al Corpus examined pt's airway for surgery ( anesthesia consult) - [place] for surgery.

## 2015-03-23 ENCOUNTER — Ambulatory Visit (HOSPITAL_BASED_OUTPATIENT_CLINIC_OR_DEPARTMENT_OTHER): Payer: 59 | Admitting: Anesthesiology

## 2015-03-23 ENCOUNTER — Encounter (HOSPITAL_BASED_OUTPATIENT_CLINIC_OR_DEPARTMENT_OTHER): Payer: Self-pay | Admitting: Anesthesiology

## 2015-03-23 ENCOUNTER — Encounter (HOSPITAL_BASED_OUTPATIENT_CLINIC_OR_DEPARTMENT_OTHER)
Admission: RE | Disposition: A | Discharge status: Home / Self Care | Payer: Self-pay | Source: Ambulatory Visit | Attending: Orthopaedic Surgery

## 2015-03-23 ENCOUNTER — Ambulatory Visit (HOSPITAL_BASED_OUTPATIENT_CLINIC_OR_DEPARTMENT_OTHER)
Admission: RE | Admit: 2015-03-23 | Discharge: 2015-03-23 | Disposition: A | Discharge status: Home / Self Care | Payer: 59 | Source: Ambulatory Visit | Attending: Orthopaedic Surgery | Admitting: Orthopaedic Surgery

## 2015-03-23 DIAGNOSIS — G5602 Carpal tunnel syndrome, left upper limb: Secondary | ICD-10-CM | POA: Insufficient documentation

## 2015-03-23 DIAGNOSIS — E119 Type 2 diabetes mellitus without complications: Secondary | ICD-10-CM | POA: Insufficient documentation

## 2015-03-23 DIAGNOSIS — Z7951 Long term (current) use of inhaled steroids: Secondary | ICD-10-CM | POA: Insufficient documentation

## 2015-03-23 DIAGNOSIS — Z791 Long term (current) use of non-steroidal anti-inflammatories (NSAID): Secondary | ICD-10-CM | POA: Insufficient documentation

## 2015-03-23 DIAGNOSIS — Z79899 Other long term (current) drug therapy: Secondary | ICD-10-CM | POA: Insufficient documentation

## 2015-03-23 DIAGNOSIS — M1990 Unspecified osteoarthritis, unspecified site: Secondary | ICD-10-CM | POA: Insufficient documentation

## 2015-03-23 DIAGNOSIS — Z9889 Other specified postprocedural states: Secondary | ICD-10-CM | POA: Insufficient documentation

## 2015-03-23 DIAGNOSIS — Z7952 Long term (current) use of systemic steroids: Secondary | ICD-10-CM | POA: Insufficient documentation

## 2015-03-23 DIAGNOSIS — J45909 Unspecified asthma, uncomplicated: Secondary | ICD-10-CM | POA: Insufficient documentation

## 2015-03-23 DIAGNOSIS — Z87891 Personal history of nicotine dependence: Secondary | ICD-10-CM | POA: Insufficient documentation

## 2015-03-23 DIAGNOSIS — L309 Dermatitis, unspecified: Secondary | ICD-10-CM | POA: Insufficient documentation

## 2015-03-23 DIAGNOSIS — I10 Essential (primary) hypertension: Secondary | ICD-10-CM | POA: Insufficient documentation

## 2015-03-23 DIAGNOSIS — Z6841 Body Mass Index (BMI) 40.0 and over, adult: Secondary | ICD-10-CM | POA: Insufficient documentation

## 2015-03-23 HISTORY — PX: CARPAL TUNNEL RELEASE: SHX101

## 2015-03-23 LAB — GLUCOSE, CAPILLARY
Glucose-Capillary: 133 mg/dL — ABNORMAL HIGH (ref 70–99)
Glucose-Capillary: 126 mg/dL — ABNORMAL HIGH (ref 70–99)

## 2015-03-23 LAB — POCT HEMOGLOBIN-HEMACUE: Hemoglobin: 16.3 g/dL — ABNORMAL HIGH (ref 12.0–15.0)

## 2015-03-23 SURGERY — CARPAL TUNNEL RELEASE
Anesthesia: General | Site: Wrist | Laterality: Left

## 2015-03-23 MED ORDER — KETOROLAC TROMETHAMINE 30 MG/ML IJ SOLN
INTRAMUSCULAR | Status: AC
  Filled 2015-03-23: qty 1

## 2015-03-23 MED ORDER — OXYCODONE-ACETAMINOPHEN 5-325 MG PO TABS: 1.0000 | ORAL_TABLET | ORAL | Status: DC | PRN

## 2015-03-23 MED ORDER — CEFAZOLIN SODIUM 1-5 GM-% IV SOLN
INTRAVENOUS | Status: AC
  Filled 2015-03-23: qty 50

## 2015-03-23 MED ORDER — MIDAZOLAM HCL 2 MG/2ML IJ SOLN
1.0000 mg | INTRAMUSCULAR | Status: DC | PRN
Start: 2015-03-23 — End: 2015-03-23
  Administered 2015-03-23: 1 mg via INTRAVENOUS

## 2015-03-23 MED ORDER — PROPOFOL 10 MG/ML IV BOLUS
INTRAVENOUS | Status: DC | PRN
  Administered 2015-03-23: 25 mg via INTRAVENOUS
  Administered 2015-03-23: 250 mg via INTRAVENOUS

## 2015-03-23 MED ORDER — SUCCINYLCHOLINE CHLORIDE 20 MG/ML IJ SOLN
INTRAMUSCULAR | Status: AC
  Filled 2015-03-23: qty 1

## 2015-03-23 MED ORDER — LACTATED RINGERS IV SOLN
INTRAVENOUS | Status: DC
  Administered 2015-03-23: 09:00:00 via INTRAVENOUS

## 2015-03-23 MED ORDER — MEPERIDINE HCL 25 MG/ML IJ SOLN: 6.2500 mg | INTRAMUSCULAR | Status: DC | PRN

## 2015-03-23 MED ORDER — KETOROLAC TROMETHAMINE 15 MG/ML IJ SOLN
15.0000 mg | Freq: Once | INTRAMUSCULAR | Status: AC | PRN
  Administered 2015-03-23: 15 mg via INTRAVENOUS

## 2015-03-23 MED ORDER — DEXAMETHASONE SODIUM PHOSPHATE 4 MG/ML IJ SOLN
INTRAMUSCULAR | Status: DC | PRN
  Administered 2015-03-23: 10 mg via INTRAVENOUS

## 2015-03-23 MED ORDER — CEFAZOLIN SODIUM-DEXTROSE 2-3 GM-% IV SOLR
INTRAVENOUS | Status: AC
  Filled 2015-03-23: qty 50

## 2015-03-23 MED ORDER — FENTANYL CITRATE (PF) 100 MCG/2ML IJ SOLN: 25.0000 ug | INTRAMUSCULAR | Status: DC | PRN

## 2015-03-23 MED ORDER — FENTANYL CITRATE (PF) 100 MCG/2ML IJ SOLN
INTRAMUSCULAR | Status: DC | PRN
  Administered 2015-03-23: 50 ug via INTRAVENOUS

## 2015-03-23 MED ORDER — LIDOCAINE HCL (CARDIAC) 20 MG/ML IV SOLN
INTRAVENOUS | Status: DC | PRN
  Administered 2015-03-23: 80 mg via INTRAVENOUS

## 2015-03-23 MED ORDER — FENTANYL CITRATE (PF) 100 MCG/2ML IJ SOLN
INTRAMUSCULAR | Status: AC
  Filled 2015-03-23: qty 4

## 2015-03-23 MED ORDER — MIDAZOLAM HCL 2 MG/2ML IJ SOLN
INTRAMUSCULAR | Status: AC
  Filled 2015-03-23: qty 2

## 2015-03-23 MED ORDER — OXYCODONE HCL 5 MG/5ML PO SOLN: 5.0000 mg | Freq: Once | ORAL | Status: DC | PRN

## 2015-03-23 MED ORDER — OXYCODONE HCL 5 MG PO TABS: 5.0000 mg | ORAL_TABLET | Freq: Once | ORAL | Status: DC | PRN

## 2015-03-23 MED ORDER — BUPIVACAINE HCL (PF) 0.25 % IJ SOLN
INTRAMUSCULAR | Status: DC | PRN
  Administered 2015-03-23: 3 mL

## 2015-03-23 MED ORDER — CEFAZOLIN SODIUM 10 G IJ SOLR
3.0000 g | INTRAMUSCULAR | Status: AC
  Administered 2015-03-23: 3 g via INTRAVENOUS

## 2015-03-23 MED ORDER — ONDANSETRON HCL 4 MG/2ML IJ SOLN
INTRAMUSCULAR | Status: DC | PRN
  Administered 2015-03-23: 4 mg via INTRAVENOUS

## 2015-03-23 SURGICAL SUPPLY — 37 items
BANDAGE ELASTIC 3 VELCRO ST LF (GAUZE/BANDAGES/DRESSINGS) ×3 IMPLANT
BLADE MINI RND TIP GREEN BEAV (BLADE) ×3 IMPLANT
BLADE SURG 15 STRL LF DISP TIS (BLADE) ×1 IMPLANT
BLADE SURG 15 STRL SS (BLADE) ×2
BNDG ESMARK 4X9 LF (GAUZE/BANDAGES/DRESSINGS) ×3 IMPLANT
BRUSH SCRUB EZ PLAIN DRY (MISCELLANEOUS) ×3 IMPLANT
CORDS BIPOLAR (ELECTRODE) ×3 IMPLANT
COVER BACK TABLE 60X90IN (DRAPES) ×3 IMPLANT
COVER MAYO STAND STRL (DRAPES) ×3 IMPLANT
CUFF TOURNIQUET SINGLE 18IN (TOURNIQUET CUFF) ×3 IMPLANT
DECANTER SPIKE VIAL GLASS SM (MISCELLANEOUS) IMPLANT
DRAPE EXTREMITY T 121X128X90 (DRAPE) ×3 IMPLANT
DRAPE SURG 17X23 STRL (DRAPES) ×3 IMPLANT
GAUZE SPONGE 4X4 12PLY STRL (GAUZE/BANDAGES/DRESSINGS) ×3 IMPLANT
GAUZE SPONGE 4X4 16PLY XRAY LF (GAUZE/BANDAGES/DRESSINGS) IMPLANT
GAUZE XEROFORM 1X8 LF (GAUZE/BANDAGES/DRESSINGS) ×3 IMPLANT
GLOVE BIOGEL PI IND STRL 7.0 (GLOVE) ×1 IMPLANT
GLOVE BIOGEL PI INDICATOR 7.0 (GLOVE) ×2
GLOVE ECLIPSE 6.5 STRL STRAW (GLOVE) ×3 IMPLANT
GLOVE NEODERM STRL 7.5 LF PF (GLOVE) ×1 IMPLANT
GLOVE SURG NEODERM 7.5  LF PF (GLOVE) ×2
GLOVE SURG SYN 7.5  E (GLOVE) ×2
GLOVE SURG SYN 7.5 E (GLOVE) ×1 IMPLANT
GOWN PREVENTION PLUS XLARGE (GOWN DISPOSABLE) ×3 IMPLANT
GOWN STRL REIN XL XLG (GOWN DISPOSABLE) ×3 IMPLANT
NEEDLE HYPO 22GX1.5 SAFETY (NEEDLE) IMPLANT
NS IRRIG 1000ML POUR BTL (IV SOLUTION) ×3 IMPLANT
PACK BASIN DAY SURGERY FS (CUSTOM PROCEDURE TRAY) ×3 IMPLANT
PAD CAST 3X4 CTTN HI CHSV (CAST SUPPLIES) ×1 IMPLANT
PADDING CAST COTTON 3X4 STRL (CAST SUPPLIES) ×2
STOCKINETTE 4X48 STRL (DRAPES) ×3 IMPLANT
SUT ETHILON 4 0 PS 2 18 (SUTURE) ×3 IMPLANT
SYR BULB 3OZ (MISCELLANEOUS) ×3 IMPLANT
SYR CONTROL 10ML LL (SYRINGE) IMPLANT
TOWEL OR 17X24 6PK STRL BLUE (TOWEL DISPOSABLE) ×3 IMPLANT
TRAY DSU PREP LF (CUSTOM PROCEDURE TRAY) ×3 IMPLANT
UNDERPAD 30X30 INCONTINENT (UNDERPADS AND DIAPERS) ×3 IMPLANT

## 2015-03-23 NOTE — Anesthesia Postprocedure Evaluation (Signed)
  Anesthesia Post-op Note  Patient: Amy Riddle  Procedure(s) Performed: Procedure(s): LEFT CARPAL TUNNEL RELEASE (Left)  Patient Location: PACU  Anesthesia Type: General   Level of Consciousness: awake, alert  and oriented  Airway and Oxygen Therapy: Patient Spontanous Breathing  Post-op Pain: mild  Post-op Assessment: Post-op Vital signs reviewed  Post-op Vital Signs: Reviewed  Last Vitals:  Filed Vitals:   03/23/15 1130  BP: 127/70  Pulse: 81  Temp: 36.5 C  Resp: 18    Complications: No apparent anesthesia complications

## 2015-03-23 NOTE — Anesthesia Preprocedure Evaluation (Addendum)
Anesthesia Evaluation  Patient identified by MRN, date of birth, ID band Patient awake    Reviewed: Allergy & Precautions, NPO status , Patient's Chart, lab work & pertinent test results  Airway Mallampati: II  TM Distance: >3 FB Neck ROM: Full    Dental  (+) Teeth Intact, Dental Advisory Given   Pulmonary asthma , former smoker,  breath sounds clear to auscultation        Cardiovascular hypertension, Pt. on medications Rhythm:Regular Rate:Normal     Neuro/Psych    GI/Hepatic   Endo/Other  diabetes, Well Controlled, Type 2Morbid obesity  Renal/GU      Musculoskeletal   Abdominal   Peds  Hematology   Anesthesia Other Findings   Reproductive/Obstetrics                            Anesthesia Physical Anesthesia Plan  ASA: III  Anesthesia Plan: General   Post-op Pain Management:    Induction: Intravenous  Airway Management Planned: LMA  Additional Equipment:   Intra-op Plan:   Post-operative Plan: Extubation in OR  Informed Consent: I have reviewed the patients History and Physical, chart, labs and discussed the procedure including the risks, benefits and alternatives for the proposed anesthesia with the patient or authorized representative who has indicated his/her understanding and acceptance.   Dental advisory given  Plan Discussed with: CRNA, Surgeon and Anesthesiologist  Anesthesia Plan Comments:         Anesthesia Quick Evaluation

## 2015-03-23 NOTE — Transfer of Care (Signed)
Immediate Anesthesia Transfer of Care Note  Patient: Amy Riddle  Procedure(s) Performed: Procedure(s): LEFT CARPAL TUNNEL RELEASE (Left)  Patient Location: PACU  Anesthesia Type:General  Level of Consciousness: awake, sedated and patient cooperative  Airway & Oxygen Therapy: Patient Spontanous Breathing and Patient connected to face mask oxygen  Post-op Assessment: Report given to RN and Post -op Vital signs reviewed and stable  Post vital signs: Reviewed and stable  Last Vitals:  Filed Vitals:   03/23/15 0902  BP: 152/77  Pulse: 73  Temp: 36.5 C  Resp: 22    Complications: No apparent anesthesia complications

## 2015-03-23 NOTE — Discharge Instructions (Signed)
Postoperative instructions:  Weightbearing: As tolerated but no heavy lifting for 4 weeks  Keep your dressing and/or splint clean and dry at all times.  You can remove your dressing on post-operative day #3 and change with a dry/sterile dressing or Band-Aids as needed thereafter.    Incision instructions:  Do not soak your incision for 3 weeks after surgery.  If the incision gets wet, pat dry and do not scrub the incision.  Pain control:  You have been given a prescription to be taken as directed for post-operative pain control.  In addition, elevate the operative extremity above the heart at all times to prevent swelling and throbbing pain.  Take over-the-counter Colace, 100mg  by mouth twice a day while taking narcotic pain medications to help prevent constipation.  Follow up appointments: 1) 10-14 days for suture removal and wound check. 2) Dr. Erlinda Hong as scheduled.   -------------------------------------------------------------------------------------------------------------  After Surgery Pain Control:  After your surgery, post-surgical discomfort or pain is likely. This discomfort can last several days to a few weeks. At certain times of the day your discomfort may be more intense.  Did you receive a nerve block?  A nerve block can provide pain relief for one hour to two days after your surgery. As long as the nerve block is working, you will experience little or no sensation in the area the surgeon operated on.  As the nerve block wears off, you will begin to experience pain or discomfort. It is very important that you begin taking your prescribed pain medication before the nerve block fully wears off. Treating your pain at the first sign of the block wearing off will ensure your pain is better controlled and more tolerable when full-sensation returns. Do not wait until the pain is intolerable, as the medicine will be less effective. It is better to treat pain in advance than to try and catch  up.  General Anesthesia:  If you did not receive a nerve block during your surgery, you will need to start taking your pain medication shortly after your surgery and should continue to do so as prescribed by your surgeon.  Pain Medication:  Most commonly we prescribe Vicodin and Percocet for post-operative pain. Both of these medications contain a combination of acetaminophen (Tylenol) and a narcotic to help control pain.   It takes between 30 and 45 minutes before pain medication starts to work. It is important to take your medication before your pain level gets too intense.   Nausea is a common side effect of many pain medications. You will want to eat something before taking your pain medicine to help prevent nausea.   If you are taking a prescription pain medication that contains acetaminophen, we recommend that you do not take additional over the counter acetaminophen (Tylenol).  Other pain relieving options:   Using a cold pack to ice the affected area a few times a day (15 to 20 minutes at a time) can help to relieve pain, reduce swelling and bruising.   Elevation of the affected area can also help to reduce pain and swelling.      Post Anesthesia Home Care Instructions  Activity: Get plenty of rest for the remainder of the day. A responsible adult should stay with you for 24 hours following the procedure.  For the next 24 hours, DO NOT: -Drive a car -Paediatric nurse -Drink alcoholic beverages -Take any medication unless instructed by your physician -Make any legal decisions or sign important papers.  Meals: Start  with liquid foods such as gelatin or soup. Progress to regular foods as tolerated. Avoid greasy, spicy, heavy foods. If nausea and/or vomiting occur, drink only clear liquids until the nausea and/or vomiting subsides. Call your physician if vomiting continues.  Special Instructions/Symptoms: Your throat may feel dry or sore from the anesthesia or the breathing  tube placed in your throat during surgery. If this causes discomfort, gargle with warm salt water. The discomfort should disappear within 24 hours.  If you had a scopolamine patch placed behind your ear for the management of post- operative nausea and/or vomiting:  1. The medication in the patch is effective for 72 hours, after which it should be removed.  Wrap patch in a tissue and discard in the trash. Wash hands thoroughly with soap and water. 2. You may remove the patch earlier than 72 hours if you experience unpleasant side effects which may include dry mouth, dizziness or visual disturbances. 3. Avoid touching the patch. Wash your hands with soap and water after contact with the patch.

## 2015-03-23 NOTE — Op Note (Signed)
   Carpal tunnel op note  DATE OF SURGERY:03/23/2015  PREOPERATIVE DIAGNOSIS:  Left carpal tunnel syndrome  POSTOPERATIVE DIAGNOSIS: same  PROCEDURE:  Left  carpal tunnel release. CPT [phone]  SURGEON: Surgeon(s): Leandrew Koyanagi, MD  ANESTHESIA:  General  TOURNIQUET TIME: Total Tourniquet Time Documented: Upper Arm (Left) - 10 minutes Total: Upper Arm (Left) - 10 minutes .  BLOOD LOSS: Minimal.  COMPLICATIONS: None.  PATHOLOGY: None.  INDICATIONS: The patient is a 69 y.o. -year-old female who presented with carpal tunnel syndrome failing nonsurgical management, indicated for surgical release.  DESCRIPTION OF PROCEDURE: The patient was identified in the preoperative holding area.  The operative site was marked by the surgeon and confirmed by the patient.  He was brought back to the operating room.  Anesthesia was induced by the anesthesia team.  A well padded nonsterile tourniquet was placed. The operative extremity was prepped and draped in standard sterile fashion.  A timeout was performed.  Preoperative antibiotics were given.   A palmar incision was made about 5 mm ulnar to the thenar crease.  The palmar aponeurosis was exposed and divided in line with the skin incision. The palmaris brevis was visualized and divided.  The distal edge of the transcarpal ligament was identified. A hemostat was inserted into the carpal tunnel to protect the median nerve and the flexor tendons. Then, the transverse carpal ligament was released under direct visualization. Proximally, a subcutaneous tunnel was made allowing a Sewell retractor to be placed. Then, the distal portion of the antebrachial fascia was released. Distally, all fibrous bands were released. The median nerve was visualized, and the fat pad was exposed. Following release, local infiltration with 0.25% of Sensorcaine was given. The tourniquet was deflated. Hemostasis achieved.  Wound was irrigated and closed with 4-0 nylon sutures.  Sterile dressing applied. The patient was transferred to the recovery room in stable condition after all counts were correct.  POSTOPERATIVE PLAN: To start nerve gliding exercises as tolerated and no heavy lifting for four weeks.  Azucena Cecil, MD [place] 9:53 AM

## 2015-03-23 NOTE — Anesthesia Procedure Notes (Signed)
Procedure Name: LMA Insertion Date/Time: 03/23/2015 9:19 AM Performed by: Lyndee Leo Pre-anesthesia Checklist: Patient identified, Emergency Drugs available, Suction available and Patient being monitored Patient Re-evaluated:Patient Re-evaluated prior to inductionOxygen Delivery Method: Circle System Utilized Preoxygenation: Pre-oxygenation with 100% oxygen Intubation Type: IV induction Ventilation: Mask ventilation without difficulty LMA: LMA with gastric port inserted LMA Size: 4.0 Number of attempts: 1 Placement Confirmation: positive ETCO2 Tube secured with: Tape Dental Injury: Teeth and Oropharynx as per pre-operative assessment

## 2015-03-23 NOTE — H&P (Signed)
PREOPERATIVE H&P  Chief Complaint: left carpal tunnel syndrome  HPI: Amy Riddle is a 69 y.o. female who presents for surgical treatment of left carpal tunnel syndrome.  She denies any changes in medical history.  Past Medical History  Diagnosis Date  . Fibroid   . Ovarian neoplasm     PRODUCING TESTOSTERONE  . Submucous myoma of uterus   . Asthma   . Eczema   . Arthritis   . Hypertension   . Osteoarthritis   . Diabetes mellitus without complication     TYPE II no meds   Past Surgical History  Procedure Laterality Date  . Hernia repair      X2  . Pelvic laparoscopy    . Hysteroscopy      D & C  . Dermoid cyst  excision    . Carpal tunnel release Left    History   Social History  . Marital Status: Married    Spouse Name: N/A  . Number of Children: N/A  . Years of Education: N/A   Social History Main Topics  . Smoking status: Former Smoker    Quit date: 03/26/1974  . Smokeless tobacco: Never Used  . Alcohol Use: No  . Drug Use: No  . Sexual Activity: Not Currently   Other Topics Concern  . None   Social History Narrative   Family History  Problem Relation Age of Onset  . Heart disease Mother   . Cancer Father     PANCREATIC  . Hypertension Brother   . Breast cancer Maternal Aunt   . Hypertension Maternal Grandmother   . Heart disease Maternal Grandfather    No Known Allergies Prior to Admission medications   Medication Sig Start Date End Date Taking? Authorizing Provider  clobetasol cream (TEMOVATE) 0.05 % Apply topically 2 (two) times daily.   Yes Historical Provider, MD  fluticasone-salmeterol (ADVAIR HFA) 115-21 MCG/ACT inhaler Inhale 2 puffs into the lungs 2 (two) times daily.   Yes Historical Provider, MD  hydrochlorothiazide (HYDRODIURIL) 25 MG tablet Take 25 mg by mouth daily.   Yes Historical Provider, MD  naproxen sodium (ANAPROX) 220 MG tablet Take 220 mg by mouth 2 (two) times daily with a meal.   Yes Historical Provider, MD      Positive ROS: All other systems have been reviewed and were otherwise negative with the exception of those mentioned in the HPI and as above.  Physical Exam: General: Alert, no acute distress Cardiovascular: No pedal edema Respiratory: No cyanosis, no use of accessory musculature GI: abdomen soft Skin: No lesions in the area of chief complaint Neurologic: Sensation intact distally Psychiatric: Patient is competent for consent with normal mood and affect Lymphatic: no lymphedema  MUSCULOSKELETAL: exam stable   Assessment: left carpal tunnel syndrome  Plan: Plan for Procedure(s): LEFT CARPAL TUNNEL RELEASE  The risks benefits and alternatives were discussed with the patient including but not limited to the risks of nonoperative treatment, versus surgical intervention including infection, bleeding, nerve injury,  blood clots, cardiopulmonary complications, morbidity, mortality, among others, and they were willing to proceed.   Marianna Payment, MD   03/23/2015 7:35 AM

## 2015-03-25 ENCOUNTER — Encounter (HOSPITAL_BASED_OUTPATIENT_CLINIC_OR_DEPARTMENT_OTHER): Payer: Self-pay | Admitting: Orthopaedic Surgery

## 2019-08-18 ENCOUNTER — Encounter: Payer: Self-pay | Admitting: Cardiology

## 2019-08-19 ENCOUNTER — Other Ambulatory Visit: Payer: Self-pay | Admitting: Family Medicine

## 2019-08-19 ENCOUNTER — Ambulatory Visit
Admission: RE | Admit: 2019-08-19 | Discharge: 2019-08-19 | Disposition: A | Discharge status: Home / Self Care | Payer: 59 | Source: Ambulatory Visit | Attending: Family Medicine | Admitting: Family Medicine

## 2019-08-19 ENCOUNTER — Other Ambulatory Visit: Payer: Self-pay

## 2019-08-19 DIAGNOSIS — R0602 Shortness of breath: Secondary | ICD-10-CM

## 2019-08-27 NOTE — Progress Notes (Addendum)
Cardiology Office Note   Date:  08/28/2019   ID:  Dalin, Keath 1946/06/13, MRN [id number]  PCP:  Leonard Downing, MD  Cardiologist:   Minus Breeding, MD Referring:  Leonard Downing, MD  Chief Complaint  Patient presents with  . Atrial Fibrillation      History of Present Illness: Amy Riddle is a 73 y.o. female who was referred by Leonard Downing, MD for evaluation of atrial fibrillation.  She has no prior cardiac history.  She felt her heart "quivering" and knew she was in fibrillation recently.  She went to see Dr. Arelia Sneddon and this was confirmed.  He started her on Eliquis.  She was also having some increased shortness of breath and some leg swelling.  She has been on 40 mg of Lasix.  He increase the Lasix to 40 mg twice daily.  She thinks she is feeling a little bit better in terms of less breathlessness which she was having.  She had some lower extremity swelling that seems to be slightly improved.  She is not feeling any presyncope or syncope.  She does feel her heart rate go fast with activity.  She denies any chest pressure, neck or arm discomfort.  She is had no prior cardiac work-up.   Past Medical History:  Diagnosis Date  . Arthritis   . Asthma   . Diabetes mellitus without complication (HCC)    TYPE II no meds  . Eczema   . Fibroid   . Hypertension   . Osteoarthritis   . Ovarian neoplasm    PRODUCING TESTOSTERONE  . Submucous myoma of uterus     Past Surgical History:  Procedure Laterality Date  . CARPAL TUNNEL RELEASE Left   . CARPAL TUNNEL RELEASE Left 03/23/2015   Procedure: LEFT CARPAL TUNNEL RELEASE;  Surgeon: Leandrew Koyanagi, MD;  Location: [place];  Service: Orthopedics;  Laterality: Left;  . DERMOID CYST  EXCISION    . HERNIA REPAIR     X2  . HYSTEROSCOPY     D & C  . PELVIC LAPAROSCOPY       Current Outpatient Medications  Medication Sig Dispense Refill  . ADVAIR DISKUS 250-50 MCG/DOSE  AEPB Inhale 1 puff into the lungs 2 (two) times daily.    [name] albuterol (VENTOLIN HFA) 108 (90 Base) MCG/ACT inhaler Inhale 1-2 puffs into the lungs every 4 (four) hours as needed for shortness of breath.    . Clobetasol Prop Emollient Base 0.05 % emollient cream Apply 1 application topically daily as needed.    [name] ELIQUIS 5 MG TABS tablet Take 1 tablet (5 mg total) by mouth 2 (two) times daily. 90 tablet 3  . furosemide (LASIX) 40 MG tablet Take 1 tablet by mouth 2 (two) times daily.    [name] losartan (COZAAR) 100 MG tablet Take 1 tablet by mouth daily.    . metFORMIN (GLUCOPHAGE) 1000 MG tablet TAKE 1 TABLET BY MOUTH DAILY WITH BREAKFAST AND 1/2 TAB WITH SUPPER FOR DIABETES    . nystatin cream (MYCOSTATIN) Apply 1 application topically daily as needed.    . diltiazem (CARDIZEM CD) 120 MG 24 hr capsule Take 1 capsule (120 mg total) by mouth daily. 90 capsule 3   No current facility-administered medications for this visit.     Allergies:   Tylenol arthritis ext [acetaminophen]    Social History:  The patient  reports that she quit smoking about 45 years ago. She has  never used smokeless tobacco. She reports that she does not drink alcohol or use drugs.   Family History:  The patient's family history includes Breast cancer in her maternal aunt; Cancer in her father; Heart disease in her maternal grandfather and mother; Hypertension in her brother and maternal grandmother.    ROS:  Please see the history of present illness.   Otherwise, review of systems are positive for arthritis.   All other systems are reviewed and negative.    PHYSICAL EXAM: VS:  BP 128/76   Pulse (!) 105   Temp (!) 97 F (36.1 C) (Temporal)   Ht 5\' 1"  (1.549 m)   Wt 270 lb 12.8 oz (122.8 kg)   SpO2 93%   BMI 51.17 kg/m  , BMI Body mass index is 51.17 kg/m. GENERAL:  Well appearing HEENT:  Pupils equal round and reactive, fundi not visualized, oral mucosa unremarkable NECK:  No jugular venous distention, waveform  within normal limits, carotid upstroke brisk and symmetric, no bruits, no thyromegaly LYMPHATICS:  No cervical, inguinal adenopathy LUNGS:  Clear to auscultation bilaterally BACK:  No CVA tenderness CHEST:  Unremarkable HEART:  PMI not displaced or sustained,S1 and S2 within normal limits, no S3, no clicks, no rubs, no murmurs, irregular ABD:  Flat, positive bowel sounds normal in frequency in pitch, no bruits, no rebound, no guarding, no midline pulsatile mass, no hepatomegaly, no splenomegaly EXT:  2 plus pulses throughout, mild edema, no cyanosis no clubbing SKIN:  No rashes no nodules NEURO:  Cranial nerves II through XII grossly intact, motor grossly intact throughout PSYCH:  Cognitively intact, oriented to person place and time    EKG:  EKG is ordered today. The ekg ordered today demonstrates atrial fibrillation, rate 125, low voltage, right axis deviation, poor anterior R wave progression, diffuse ventricular contraction.     Recent Labs: No results found for requested labs within last 8760 hours.    Lipid Panel No results found for: CHOL, TRIG, HDL, CHOLHDL, VLDL, LDLCALC, LDLDIRECT    Wt Readings from Last 3 Encounters:  08/28/19 270 lb 12.8 oz (122.8 kg)  03/18/15 (!) 303 lb (137.4 kg)  03/26/13 (!) 304 lb (137.9 kg)      Other studies Reviewed: Additional studies/ records that were reviewed today include: Labs, EKG. Review of the above records demonstrates:  Please see elsewhere in the note.     ASSESSMENT AND PLAN:   ATRIAL FIB:   The patient is on Eliquis.  I will continue this.  I will check an echocardiogram.  She needs a basic metabolic profile.  I will check a Holter monitor to make sure that this is persistent and g her Cardizem 120 mg for better rate control.  Because she does feel this and has some symptoms I am then planning a cardioversion after I get the results of the Holter and she has been on the Eliquis for at least 4 weeks.  HTN: Blood pressure  will be controlled in the context of managing her heart rhythm.  OBESITY: She has tried every weight loss strategy in the past.  She is lost some weight and I encouraged more of the same.  Current medicines are reviewed at length with the patient today.  The patient does not have concerns regarding medicines.  The following changes have been made:  As above  Labs/ tests ordered today include:   Orders Placed This Encounter  Procedures  . Basic metabolic panel  . LONG TERM MONITOR (3-14 DAYS)  .  EKG 12-Lead  . ECHOCARDIOGRAM COMPLETE     Disposition:   FU with me after the DCCV.     Signed, Minus Breeding, MD  08/28/2019 2:08 PM    [facility]

## 2019-08-28 ENCOUNTER — Other Ambulatory Visit: Payer: Self-pay

## 2019-08-28 ENCOUNTER — Encounter: Payer: Self-pay | Admitting: Cardiology

## 2019-08-28 ENCOUNTER — Ambulatory Visit: Payer: 59 | Admitting: Cardiology

## 2019-08-28 VITALS — BP 128/76 | HR 105 | Temp 97.0°F | Ht 61.0 in | Wt 270.8 lb

## 2019-08-28 DIAGNOSIS — I10 Essential (primary) hypertension: Secondary | ICD-10-CM | POA: Diagnosis not present

## 2019-08-28 DIAGNOSIS — I4891 Unspecified atrial fibrillation: Secondary | ICD-10-CM | POA: Diagnosis not present

## 2019-08-28 MED ORDER — ELIQUIS 5 MG PO TABS: 5.0000 mg | ORAL_TABLET | Freq: Two times a day (BID) | ORAL | 3 refills | Status: DC

## 2019-08-28 MED ORDER — DILTIAZEM HCL ER COATED BEADS 120 MG PO CP24: 120.0000 mg | ORAL_CAPSULE | Freq: Every day | ORAL | 3 refills | Status: DC

## 2019-08-28 NOTE — Patient Instructions (Signed)
Medication Instructions:  Take 120mg  Cardizem daily.   If you need a refill on your cardiac medications before your next appointment, please call your pharmacy.   Lab work: BMP If you have labs (blood work) drawn today and your tests are completely normal, you will receive your results only by: [place] (if you have MyChart) OR A paper copy in the mail If you have any lab test that is abnormal or we need to change your treatment, we will call you to review the results.  Testing/Procedures: Your physician has requested that you have an echocardiogram. Echocardiography is a painless test that uses sound waves to create images of your heart. It provides your doctor with information about the size and shape of your heart and how well your heart's chambers and valves are working. This procedure takes approximately one hour. There are no restrictions for this procedure. [place] has recommended that you wear a holter monitor. Holter monitors are medical devices that record the heart's electrical activity. Doctors most often use these monitors to diagnose arrhythmias. Arrhythmias are problems with the speed or rhythm of the heartbeat. The monitor is a small, portable device. You can wear one while you do your normal daily activities. This is usually used to diagnose what is causing palpitations/syncope (passing out). -Someone will call you to confirm this and send in mail.   Follow-Up: At [facility], you and your health needs are our priority.  As part of our continuing mission to provide you with exceptional heart care, we have created designated Provider Care Teams.  These Care Teams include your primary Cardiologist (physician) and Advanced Practice Providers (APPs -  Physician Assistants and Nurse Practitioners) who all work together to provide you with the care you need, when you need it. You will need a follow up appointment in 8 weeks.  Please  call our office 2 months in advance to schedule this appointment.  You may see Minus Breeding, MD or one of the following Advanced Practice Providers on your designated Care Team:   Rosaria Ferries, PA-C Jory Sims, DNP, ANP  Any Other Special Instructions Will Be Listed Below (If Applicable).                 Electrical Cardioversion  Electrical cardioversion is the delivery of a jolt of electricity to restore a normal rhythm to the heart. A rhythm that is too fast or is not regular keeps the heart from pumping well. In this procedure, sticky patches or metal paddles are placed on the chest to deliver electricity to the heart from a device. This procedure may be done in an emergency if:  There is low or no blood pressure as a result of the heart rhythm.  Normal rhythm must be restored as fast as possible to protect the brain and heart from further damage.  It may save a life. This procedure may also be done for irregular or fast heart rhythms that are not immediately life-threatening. Tell a health care provider about:  Any allergies you have.  All medicines you are taking, including vitamins, herbs, eye drops, creams, and over-the-counter medicines.  Any problems you or family members have had with anesthetic medicines.  Any blood disorders you have.  Any surgeries you have had.  Any medical conditions you have.  Whether you are pregnant or may be pregnant. What are the risks? Generally, this is a safe procedure. However, problems may occur, including:  Allergic  reactions to medicines.  A blood clot that breaks free and travels to other parts of your body.  The possible return of an abnormal heart rhythm within hours or days after the procedure.  Your heart stopping (cardiac arrest). This is rare. What happens before the procedure? Medicines  Your health care provider may have you start taking: ? Blood-thinning medicines (anticoagulants) so your  blood does not clot as easily. ? Medicines may be given to help stabilize your heart rate and rhythm.  Ask your health care provider about changing or stopping your regular medicines. This is especially important if you are taking diabetes medicines or blood thinners. General instructions  Plan to have someone take you home from the hospital or clinic.  If you will be going home right after the procedure, plan to have someone with you for 24 hours.  Follow instructions from your health care provider about eating or drinking restrictions. What happens during the procedure?  To lower your risk of infection: ? Your health care team will wash or sanitize their hands. ? Your skin will be washed with soap.  An IV tube will be inserted into one of your veins.  You will be given a medicine to help you relax (sedative).  Sticky patches (electrodes) or metal paddles may be placed on your chest.  An electrical shock will be delivered. The procedure may vary among health care providers and hospitals. What happens after the procedure?   Your blood pressure, heart rate, breathing rate, and blood oxygen level will be monitored until the medicines you were given have worn off.  Do not drive for 24 hours if you were given a sedative.  Your heart rhythm will be watched to make sure it does not change. This information is not intended to replace advice given to you by your health care provider. Make sure you discuss any questions you have with your health care provider. Document Released: 11/09/2002 Document Revised: 11/01/2017 Document Reviewed: 05/25/2016 Elsevier Patient Education  2020 [employer].

## 2019-08-29 LAB — BASIC METABOLIC PANEL
BUN/Creatinine Ratio: 22 (ref 12–28)
BUN: 18 mg/dL (ref 8–27)
CO2: 28 mmol/L (ref 20–29)
Calcium: 9.4 mg/dL (ref 8.7–10.3)
Chloride: 101 mmol/L (ref 96–106)
Creatinine, Ser: 0.81 mg/dL (ref 0.57–1.00)
GFR calc Af Amer: 85 mL/min/{1.73_m2} (ref >59)
GFR calc non Af Amer: 73 mL/min/{1.73_m2} (ref >59)
Glucose: 90 mg/dL (ref 65–99)
Potassium: 4 mmol/L (ref 3.5–5.2)
Sodium: 143 mmol/L (ref 134–144)

## 2019-08-31 ENCOUNTER — Telehealth: Payer: Self-pay | Admitting: *Deleted

## 2019-08-31 NOTE — Telephone Encounter (Signed)
3 day ZIO XT long term holter monitor to be mailed to the patients home.  Patient is aware she needs to wear the monitor at least 2 days.  Instructions reviewed briefly as they are included in the monitor kit.

## 2019-09-01 ENCOUNTER — Ambulatory Visit (HOSPITAL_COMMUNITY): Payer: 59 | Attending: Cardiology

## 2019-09-01 ENCOUNTER — Other Ambulatory Visit: Payer: Self-pay

## 2019-09-01 DIAGNOSIS — I4891 Unspecified atrial fibrillation: Secondary | ICD-10-CM | POA: Diagnosis present

## 2019-09-01 DIAGNOSIS — I10 Essential (primary) hypertension: Secondary | ICD-10-CM

## 2019-09-02 ENCOUNTER — Telehealth: Payer: Self-pay | Admitting: Cardiology

## 2019-09-02 NOTE — Telephone Encounter (Signed)
Patient made aware of results and verbalized understanding.  Notes recorded by Minus Breeding, MD on 08/30/2019 at 8:09 AM EDT  BMET OK. Call Ms. Megan Salon with the results and send results to Leonard Downing, MD

## 2019-09-02 NOTE — Telephone Encounter (Signed)
Patient is returning call for results. 

## 2019-09-07 ENCOUNTER — Ambulatory Visit (INDEPENDENT_AMBULATORY_CARE_PROVIDER_SITE_OTHER): Payer: 59

## 2019-09-07 DIAGNOSIS — I4891 Unspecified atrial fibrillation: Secondary | ICD-10-CM | POA: Diagnosis not present

## 2019-09-08 ENCOUNTER — Telehealth: Payer: Self-pay

## 2019-09-08 NOTE — Telephone Encounter (Signed)
Gave pt echo results. Verbalized understanding.

## 2019-09-08 NOTE — Telephone Encounter (Signed)
Lmtcb regarding echo results.

## 2019-09-25 DIAGNOSIS — Z0189 Encounter for other specified special examinations: Secondary | ICD-10-CM

## 2019-09-25 DIAGNOSIS — I4891 Unspecified atrial fibrillation: Secondary | ICD-10-CM

## 2019-09-25 NOTE — Progress Notes (Signed)
Put in order letter for cardioversion and lab orders. Spoke to pt. Verbalized understanding.

## 2019-10-01 ENCOUNTER — Other Ambulatory Visit (HOSPITAL_COMMUNITY)
Admission: RE | Admit: 2019-10-01 | Discharge: 2019-10-01 | Disposition: A | Discharge status: Home / Self Care | Payer: 59 | Source: Ambulatory Visit | Attending: Internal Medicine | Admitting: Internal Medicine

## 2019-10-01 DIAGNOSIS — Z01812 Encounter for preprocedural laboratory examination: Secondary | ICD-10-CM | POA: Diagnosis present

## 2019-10-01 DIAGNOSIS — Z20828 Contact with and (suspected) exposure to other viral communicable diseases: Secondary | ICD-10-CM | POA: Insufficient documentation

## 2019-10-02 LAB — NOVEL CORONAVIRUS, NAA (HOSP ORDER, SEND-OUT TO REF LAB; TAT 18-24 HRS): SARS-CoV-2, NAA: NOT DETECTED

## 2019-10-02 LAB — CBC
Hematocrit: 49.3 % — ABNORMAL HIGH (ref 34.0–46.6)
Hemoglobin: 15.8 g/dL (ref 11.1–15.9)
MCH: 27.1 pg (ref 26.6–33.0)
MCHC: 32 g/dL (ref 31.5–35.7)
MCV: 85 fL (ref 79–97)
Platelets: 235 10*3/uL (ref 150–450)
RBC: 5.82 x10E6/uL — ABNORMAL HIGH (ref 3.77–5.28)
RDW: 15.1 % (ref 11.7–15.4)
WBC: 7.8 10*3/uL (ref 3.4–10.8)

## 2019-10-02 LAB — BASIC METABOLIC PANEL
BUN/Creatinine Ratio: 23 (ref 12–28)
BUN: 16 mg/dL (ref 8–27)
CO2: 25 mmol/L (ref 20–29)
Calcium: 9.9 mg/dL (ref 8.7–10.3)
Chloride: 99 mmol/L (ref 96–106)
Creatinine, Ser: 0.7 mg/dL (ref 0.57–1.00)
GFR calc Af Amer: 101 mL/min/{1.73_m2} (ref >59)
GFR calc non Af Amer: 87 mL/min/{1.73_m2} (ref >59)
Glucose: 122 mg/dL — ABNORMAL HIGH (ref 65–99)
Potassium: 4.2 mmol/L (ref 3.5–5.2)
Sodium: 142 mmol/L (ref 134–144)

## 2019-10-05 ENCOUNTER — Ambulatory Visit (HOSPITAL_COMMUNITY): Payer: 59 | Admitting: Certified Registered Nurse Anesthetist

## 2019-10-05 ENCOUNTER — Encounter (HOSPITAL_COMMUNITY)
Admission: RE | Disposition: A | Discharge status: Home / Self Care | Payer: Self-pay | Source: Home / Self Care | Attending: Internal Medicine

## 2019-10-05 ENCOUNTER — Encounter (HOSPITAL_COMMUNITY): Payer: Self-pay | Admitting: Certified Registered Nurse Anesthetist

## 2019-10-05 ENCOUNTER — Other Ambulatory Visit: Payer: Self-pay

## 2019-10-05 ENCOUNTER — Ambulatory Visit (HOSPITAL_COMMUNITY)
Admission: RE | Admit: 2019-10-05 | Discharge: 2019-10-05 | Disposition: A | Discharge status: Home / Self Care | Payer: 59 | Attending: Internal Medicine | Admitting: Internal Medicine

## 2019-10-05 DIAGNOSIS — M1990 Unspecified osteoarthritis, unspecified site: Secondary | ICD-10-CM | POA: Diagnosis not present

## 2019-10-05 DIAGNOSIS — I10 Essential (primary) hypertension: Secondary | ICD-10-CM | POA: Diagnosis not present

## 2019-10-05 DIAGNOSIS — Z8249 Family history of ischemic heart disease and other diseases of the circulatory system: Secondary | ICD-10-CM | POA: Insufficient documentation

## 2019-10-05 DIAGNOSIS — J45909 Unspecified asthma, uncomplicated: Secondary | ICD-10-CM | POA: Insufficient documentation

## 2019-10-05 DIAGNOSIS — Z7984 Long term (current) use of oral hypoglycemic drugs: Secondary | ICD-10-CM | POA: Diagnosis not present

## 2019-10-05 DIAGNOSIS — Z7901 Long term (current) use of anticoagulants: Secondary | ICD-10-CM | POA: Diagnosis not present

## 2019-10-05 DIAGNOSIS — E119 Type 2 diabetes mellitus without complications: Secondary | ICD-10-CM | POA: Insufficient documentation

## 2019-10-05 DIAGNOSIS — Z886 Allergy status to analgesic agent status: Secondary | ICD-10-CM | POA: Insufficient documentation

## 2019-10-05 DIAGNOSIS — Z87891 Personal history of nicotine dependence: Secondary | ICD-10-CM | POA: Insufficient documentation

## 2019-10-05 DIAGNOSIS — I4891 Unspecified atrial fibrillation: Secondary | ICD-10-CM

## 2019-10-05 DIAGNOSIS — Z79899 Other long term (current) drug therapy: Secondary | ICD-10-CM | POA: Insufficient documentation

## 2019-10-05 DIAGNOSIS — Z7951 Long term (current) use of inhaled steroids: Secondary | ICD-10-CM | POA: Insufficient documentation

## 2019-10-05 HISTORY — PX: CARDIOVERSION: SHX1299

## 2019-10-05 LAB — GLUCOSE, CAPILLARY: Glucose-Capillary: 106 mg/dL — ABNORMAL HIGH (ref 70–99)

## 2019-10-05 SURGERY — CARDIOVERSION
Anesthesia: General

## 2019-10-05 MED ORDER — PROPOFOL 10 MG/ML IV BOLUS
INTRAVENOUS | Status: DC | PRN
  Administered 2019-10-05: 40 mg via INTRAVENOUS
  Administered 2019-10-05: 80 mg via INTRAVENOUS
  Administered 2019-10-05: 20 mg via INTRAVENOUS

## 2019-10-05 MED ORDER — LIDOCAINE 2% (20 MG/ML) 5 ML SYRINGE
INTRAMUSCULAR | Status: DC | PRN
  Administered 2019-10-05: 100 mg via INTRAVENOUS

## 2019-10-05 MED ORDER — SODIUM CHLORIDE 0.9 % IV SOLN
INTRAVENOUS | Status: DC
  Administered 2019-10-05: 10:00:00 via INTRAVENOUS

## 2019-10-05 NOTE — H&P (Signed)
Marland KitchenUPDATED H and P\  Pt presents for cardioversion    History of Present Illness: Amy Riddle is a 73 y.o. female who was referred by Leonard Downing, MD for evaluation of atrial fibrillation.  She has no prior cardiac history.  She felt her heart "quivering" and knew she was in fibrillation recently.  She went to see Dr. Arelia Sneddon and this was confirmed.  He started her on Eliquis.  She was also having some increased shortness of breath and some leg swelling.  She has been on 40 mg of Lasix.  He increase the Lasix to 40 mg twice daily.    She was seen by Vita Barley in clinic    Set up for cardioversion for atrial fibrillation      Past Medical History:  Diagnosis Date  . Arthritis   . Asthma   . Diabetes mellitus without complication (HCC)    TYPE II no meds  . Eczema   . Fibroid   . Hypertension   . Osteoarthritis   . Ovarian neoplasm    PRODUCING TESTOSTERONE  . Submucous myoma of uterus          Past Surgical History:  Procedure Laterality Date  . CARPAL TUNNEL RELEASE Left   . CARPAL TUNNEL RELEASE Left 03/23/2015   Procedure: LEFT CARPAL TUNNEL RELEASE;  Surgeon: Leandrew Koyanagi, MD;  Location: [place];  Service: Orthopedics;  Laterality: Left;  . DERMOID CYST  EXCISION    . HERNIA REPAIR     X2  . HYSTEROSCOPY     D & C  . PELVIC LAPAROSCOPY             Current Outpatient Medications  Medication Sig Dispense Refill  . ADVAIR DISKUS 250-50 MCG/DOSE AEPB Inhale 1 puff into the lungs 2 (two) times daily.    [name] albuterol (VENTOLIN HFA) 108 (90 Base) MCG/ACT inhaler Inhale 1-2 puffs into the lungs every 4 (four) hours as needed for shortness of breath.    . Clobetasol Prop Emollient Base 0.05 % emollient cream Apply 1 application topically daily as needed.    [name] ELIQUIS 5 MG TABS tablet Take 1 tablet (5 mg total) by mouth 2 (two) times daily. 90 tablet 3  . furosemide (LASIX) 40 MG tablet Take 1 tablet by mouth  2 (two) times daily.    [name] losartan (COZAAR) 100 MG tablet Take 1 tablet by mouth daily.    . metFORMIN (GLUCOPHAGE) 1000 MG tablet TAKE 1 TABLET BY MOUTH DAILY WITH BREAKFAST AND 1/2 TAB WITH SUPPER FOR DIABETES    . nystatin cream (MYCOSTATIN) Apply 1 application topically daily as needed.    . diltiazem (CARDIZEM CD) 120 MG 24 hr capsule Take 1 capsule (120 mg total) by mouth daily. 90 capsule 3   No current facility-administered medications for this visit.     Allergies:   Tylenol arthritis ext [acetaminophen]    Social History:  The patient  reports that she quit smoking about 45 years ago. She has never used smokeless tobacco. She reports that she does not drink alcohol or use drugs.   Family History:  The patient's family history includes Breast cancer in her maternal aunt; Cancer in her father; Heart disease in her maternal grandfather and mother; Hypertension in her brother and maternal grandmother.    ROS:  Please see the history of present illness.   Otherwise, review of systems are positive for arthritis.   All other systems are reviewed and  negative.    PHYSICAL EXAM: VS:  BP   GENERAL:  Well appearing HEENT:  Pupils equal round and reactive, fundi not visualized, oral mucosa unremarkable NECK:  No jugular venous distention, waveform within normal limits, carotid upstroke brisk and symmetric, no bruits, no thyromegaly LYMPHATICS:  No cervical, inguinal adenopathy LUNGS:  Clear to auscultation bilaterally BACK:  No CVA tenderness CHEST:  Unremarkable HEART:  PMI not displaced or sustained,S1 and S2 within normal limits, no S3, no clicks, no rubs, no murmurs, irregular ABD:  Flat, positive bowel sounds normal in frequency in pitch, no bruits, no rebound, no guarding, no midline pulsatile mass, no hepatomegaly, no splenomegaly EXT:  2 plus pulses throughout, mild edema, no cyanosis no clubbing SKIN:  No rashes no nodules NEURO:  Cranial nerves II through  XII grossly intact, motor grossly intact throughout PSYCH:  Cognitively intact, oriented to person place and time    EKG:  EKG is ordered today. The ekg ordered today demonstrates atrial fibrillation, rate 125, low voltage, right axis deviation, poor anterior R wave progression, diffuse ventricular contraction.     Recent Labs: No results found for requested labs within last 8760 hours.    Lipid Panel Labs (Brief)  No results found for: CHOL, TRIG, HDL, CHOLHDL, VLDL, LDLCALC, LDLDIRECT         Wt Readings from Last 3 Encounters:  08/28/19 270 lb 12.8 oz (122.8 kg)  03/18/15 (!) 303 lb (137.4 kg)  03/26/13 (!) 304 lb (137.9 kg)      Other studies Reviewed: Additional studies/ records that were reviewed today include: Labs, EKG. Review of the above records demonstrates:  Please see elsewhere in the note.     ASSESSMENT AND PLAN:  Atrial fibrillation   Pt has been on oral anticoagulation for atrial fibrllation  REferred for cardioversion today.   Anesthesia to assist.  Dorris Carnes MD

## 2019-10-05 NOTE — Transfer of Care (Signed)
Immediate Anesthesia Transfer of Care Note  Patient: Amy Riddle  Procedure(s) Performed: CARDIOVERSION (N/A )  Patient Location: Endoscopy Unit  Anesthesia Type:General  Level of Consciousness: awake, alert  and oriented  Airway & Oxygen Therapy: Patient Spontanous Breathing  Post-op Assessment: Report given to RN and Post -op Vital signs reviewed and stable  Post vital signs: Reviewed and stable  Last Vitals:  Vitals Value Taken Time  BP    Temp    Pulse    Resp    SpO2      Last Pain:  Vitals:   10/05/19 0938  TempSrc: Temporal  PainSc: 0-No pain         Complications: No apparent anesthesia complications

## 2019-10-05 NOTE — CV Procedure (Signed)
CARDIOVERSION  Pt sedated by anestheia with intravenous Propofol and lidocaine  Pads placed in AP position   Cardioversion attempted with 200 J synchronized biphasic energy  Unsuccessful  Pads moved to apex/base position   Cardioversion with 200 J syncrhonized biphasic energy resulted in conversion to a regular rhythm 108 bpm  Difficult to see p waves  As attempting to reshock for ? Flutter, patient cardioverted on own to SR  12 lead EKG done    While pt hooked up went in and out of probable accelerated junctional rhythms   EKGs done      Procedures without complication  Pt to be discharged home on same meds for outpt f/u  Dorris Carnes MD

## 2019-10-05 NOTE — Discharge Instructions (Signed)

## 2019-10-05 NOTE — Anesthesia Procedure Notes (Signed)
Procedure Name: General with mask airway Date/Time: 10/05/2019 10:10 AM Performed by: Candis Shine, CRNA Pre-anesthesia Checklist: Patient identified, Emergency Drugs available, Suction available, Patient being monitored and Timeout performed Patient Re-evaluated:Patient Re-evaluated prior to induction Oxygen Delivery Method: Ambu bag Preoxygenation: Pre-oxygenation with 100% oxygen Induction Type: IV induction Dental Injury: Teeth and Oropharynx as per pre-operative assessment

## 2019-10-05 NOTE — Anesthesia Postprocedure Evaluation (Signed)
Anesthesia Post Note  Patient: ARLYNE EHLER  Procedure(s) Performed: CARDIOVERSION (N/A )     Patient location during evaluation: Endoscopy Anesthesia Type: General Level of consciousness: awake and alert Pain management: pain level controlled Vital Signs Assessment: post-procedure vital signs reviewed and stable Respiratory status: spontaneous breathing, nonlabored ventilation, respiratory function stable and patient connected to nasal cannula oxygen Cardiovascular status: blood pressure returned to baseline and stable Postop Assessment: no apparent nausea or vomiting Anesthetic complications: no    Last Vitals:  Vitals:   10/05/19 1035 10/05/19 1045  BP: 129/90 (!) 132/98  Pulse: 83 87  Resp: 17 (!) 23  Temp:    SpO2: 95% 95%    Last Pain:  Vitals:   10/05/19 1045  TempSrc:   PainSc: 0-No pain                 Neamiah Sciarra DANIEL

## 2019-10-05 NOTE — Anesthesia Preprocedure Evaluation (Addendum)
Anesthesia Evaluation  Patient identified by MRN, date of birth, ID band Patient awake    Reviewed: Allergy & Precautions, NPO status , Patient's Chart, lab work & pertinent test results  History of Anesthesia Complications Negative for: history of anesthetic complications  Airway Mallampati: II   Neck ROM: Full    Dental no notable dental hx. (+) Dental Advisory Given   Pulmonary former smoker,    Pulmonary exam normal        Cardiovascular hypertension, Pt. on medications + dysrhythmias Atrial Fibrillation  Rhythm:Irregular Rate:Tachycardia  IMPRESSIONS     1. Left ventricular ejection fraction, by visual estimation, is 45 to 50%. The left ventricle has mildly decreased function. Normal left ventricular size. There is mildly increased left ventricular hypertrophy. 2. Left ventricular diastolic Doppler parameters are indeterminate pattern of LV diastolic filling. 3. Challenging to estimate EF. Rapid AFIB. 4. Global right ventricle has normal systolic function.The right ventricular size is normal. No increase in right ventricular wall thickness. 5. Left atrial size was mildly dilated. 6. Right atrial size was mildly dilated. 7. The mitral valve is normal in structure. No evidence of mitral valve regurgitation. No evidence of mitral stenosis. 8. The tricuspid valve is normal in structure. Tricuspid valve regurgitation was not visualized by color flow Doppler. 9. The aortic valve is normal in structure. Aortic valve regurgitation was not visualized by color flow Doppler. Structurally normal aortic valve, with no evidence of sclerosis or stenosis. 10. The pulmonic valve was normal in structure. Pulmonic valve regurgitation is not visualized by color flow Doppler. 11. The inferior vena cava is normal in size with greater than 50% respiratory variability, suggesting right atrial pressure of 3 mmHg.   Neuro/Psych negative neurological  ROS  negative psych ROS   GI/Hepatic negative GI ROS, Neg liver ROS,   Endo/Other  diabetesMorbid obesity  Renal/GU negative Renal ROS     Musculoskeletal   Abdominal   Peds  Hematology   Anesthesia Other Findings   Reproductive/Obstetrics                            Anesthesia Physical Anesthesia Plan  ASA: III  Anesthesia Plan: General   Post-op Pain Management:    Induction: Intravenous  PONV Risk Score and Plan: 3 and Propofol infusion and Treatment may vary due to age or medical condition  Airway Management Planned: Mask  Additional Equipment:   Intra-op Plan:   Post-operative Plan:   Informed Consent: I have reviewed the patients History and Physical, chart, labs and discussed the procedure including the risks, benefits and alternatives for the proposed anesthesia with the patient or authorized representative who has indicated his/her understanding and acceptance.     Dental advisory given  Plan Discussed with: CRNA and Anesthesiologist  Anesthesia Plan Comments:         Anesthesia Quick Evaluation

## 2019-10-07 ENCOUNTER — Encounter (HOSPITAL_COMMUNITY): Payer: Self-pay | Admitting: Internal Medicine

## 2019-10-22 NOTE — Progress Notes (Signed)
Cardiology Office Note   Date:  10/23/2019   ID:  Amy, Riddle December 01, 1946, MRN [id number]  PCP:  Leonard Downing, MD  Cardiologist:   Minus Breeding, MD Referring:  Leonard Downing, MD  Chief Complaint  Patient presents with  . Atrial Fibrillation      History of Present Illness: Amy Riddle is a 73 y.o. female who was referred by Leonard Downing, MD for evaluation of atrial fibrillation.  Since I saw her she is done okay.  She has not felt her heart racing.  She would not know that it was in fibrillation.  She tolerates anticoagulation.  She has not noticed any presyncope or syncope.  She has no shortness of breath, PND or orthopnea.  She said no weight gain or edema.    Past Medical History:  Diagnosis Date  . Arthritis   . Asthma   . Diabetes mellitus without complication (HCC)    TYPE II no meds  . Eczema   . Fibroid   . Hypertension   . Osteoarthritis   . Ovarian neoplasm    PRODUCING TESTOSTERONE  . Submucous myoma of uterus     Past Surgical History:  Procedure Laterality Date  . CARDIOVERSION N/A 10/05/2019   Procedure: CARDIOVERSION;  Surgeon: Fay Records, MD;  Location: [place];  Service: Cardiovascular;  Laterality: N/A;  . CARPAL TUNNEL RELEASE Left   . CARPAL TUNNEL RELEASE Left 03/23/2015   Procedure: LEFT CARPAL TUNNEL RELEASE;  Surgeon: Leandrew Koyanagi, MD;  Location: [place];  Service: Orthopedics;  Laterality: Left;  . DERMOID CYST  EXCISION    . HERNIA REPAIR     X2  . HYSTEROSCOPY     D & C  . PELVIC LAPAROSCOPY       Current Outpatient Medications  Medication Sig Dispense Refill  . acetaminophen (TYLENOL) 500 MG tablet Take 1,000 mg by mouth every 8 (eight) hours as needed for mild pain or moderate pain.    [name] ADVAIR DISKUS 250-50 MCG/DOSE AEPB Inhale 1 puff into the lungs 2 (two) times daily.    [name] albuterol (VENTOLIN HFA) 108 (90 Base) MCG/ACT inhaler Inhale 1-2 puffs into the  lungs 2 (two) times daily as needed for shortness of breath.     . Clobetasol Prop Emollient Base 0.05 % emollient cream Apply 1 application topically daily as needed (eczema).     . diclofenac sodium (VOLTAREN) 1 % GEL Apply 2 g topically 2 (two) times daily.    [name] diltiazem (CARDIZEM CD) 240 MG 24 hr capsule Take 1 capsule (240 mg total) by mouth daily. 90 capsule 3  . ELIQUIS 5 MG TABS tablet Take 1 tablet (5 mg total) by mouth 2 (two) times daily. 90 tablet 3  . furosemide (LASIX) 40 MG tablet Take 40 mg by mouth 2 (two) times daily.     [name] losartan (COZAAR) 100 MG tablet Take 1 tablet by mouth at bedtime.     . metFORMIN (GLUCOPHAGE) 1000 MG tablet Take 500-1,000 mg by mouth daily with breakfast. Take 1000 mg in the morning and 500 mg in the evening    . nystatin cream (MYCOSTATIN) Apply 1 application topically daily as needed (yeast under breast).      No current facility-administered medications for this visit.     Allergies:   Tylenol arthritis ext [acetaminophen]    ROS:  Please see the history of present illness.   Otherwise, review of  systems are positive for none.   All other systems are reviewed and negative.    PHYSICAL EXAM: VS:  BP (!) 142/100   Pulse 97   Ht 5\' 2"  (1.575 m)   Wt 268 lb (121.6 kg)   SpO2 99%   BMI 49.02 kg/m  , BMI Body mass index is 49.02 kg/m. GENERAL:  Well appearing NECK:  No jugular venous distention, waveform within normal limits, carotid upstroke brisk and symmetric, no bruits, no thyromegaly LUNGS:  Clear to auscultation bilaterally CHEST:  Unremarkable HEART:  PMI not displaced or sustained,S1 and S2 within normal limits, no S3, no clicks, no rubs, no murmurs, irregular ABD:  Flat, positive bowel sounds normal in frequency in pitch, no bruits, no rebound, no guarding, no midline pulsatile mass, no hepatomegaly, no splenomegaly EXT:  2 plus pulses throughout, no edema, no cyanosis no clubbing   EKG:  EKG is not ordered today.  Recent Labs:  10/01/2019: BUN 16; Creatinine, Ser 0.70; Hemoglobin 15.8; Platelets 235; Potassium 4.2; Sodium 142    Lipid Panel No results found for: CHOL, TRIG, HDL, CHOLHDL, VLDL, LDLCALC, LDLDIRECT    Wt Readings from Last 3 Encounters:  10/23/19 268 lb (121.6 kg)  10/05/19 270 lb (122.5 kg)  08/28/19 270 lb 12.8 oz (122.8 kg)      Other studies Reviewed: Additional studies/ records that were reviewed today include: None Review of the above records demonstrates:  Please see elsewhere in the note.     ASSESSMENT AND PLAN:   ATRIAL FIB:   She is status post DCCV.   However, she is back in fibrillation.  She does not really notice this.  Minna pursue rate control and anticoagulation.  No change in therapy.    HTN:   Her blood pressure is slightly elevated but this is unusual.  This will be treated in the context of managing her rate.   OBESITY: She is lost a couple more pounds.  I continue to encourage more of this.  Current medicines are reviewed at length with the patient today.  The patient does not have concerns regarding medicines.  The following changes have been made:  As above  Labs/ tests ordered today include:   No orders of the defined types were placed in this encounter.    Disposition:   FU with APP in 2 months.    Signed, Minus Breeding, MD  10/23/2019 3:39 PM    [facility]

## 2019-10-23 ENCOUNTER — Ambulatory Visit: Payer: 59 | Admitting: Cardiology

## 2019-10-23 ENCOUNTER — Encounter: Payer: Self-pay | Admitting: Cardiology

## 2019-10-23 ENCOUNTER — Other Ambulatory Visit: Payer: Self-pay

## 2019-10-23 VITALS — BP 142/100 | HR 97 | Ht 62.0 in | Wt 268.0 lb

## 2019-10-23 DIAGNOSIS — I4819 Other persistent atrial fibrillation: Secondary | ICD-10-CM

## 2019-10-23 DIAGNOSIS — I10 Essential (primary) hypertension: Secondary | ICD-10-CM | POA: Diagnosis not present

## 2019-10-23 MED ORDER — DILTIAZEM HCL ER COATED BEADS 240 MG PO CP24: 240.0000 mg | ORAL_CAPSULE | Freq: Every day | ORAL | 3 refills | Status: DC

## 2019-10-23 NOTE — Patient Instructions (Signed)
Medication Instructions:  Increase Cardizem to 240mg  Daily  If you need a refill on your cardiac medications before your next appointment, please call your pharmacy.   Lab work: NONE  Testing/Procedures: NONE  Follow-Up: At [facility], you and your health needs are our priority.  As part of our continuing mission to provide you with exceptional heart care, we have created designated Provider Care Teams.  These Care Teams include your primary Cardiologist (physician) and Advanced Practice Providers (APPs -  Physician Assistants and Nurse Practitioners) who all work together to provide you with the care you need, when you need it. You may see Minus Breeding, MD or one of the following Advanced Practice Providers on your designated Care Team:    Rosaria Ferries, PA-C  Jory Sims, DNP, ANP  Cadence Kathlen Mody, NP  Your physician wants you to follow-up in:2 months with a Physicians Assistant  Any Other Special Instructions Will Be Listed Below (If Applicable). Get a Pulse Oximeter

## 2019-12-27 NOTE — Progress Notes (Signed)
Cardiology Office Note   Date:  12/28/2019   ID:  Raahi, Bedrossian March 31, 1946, MRN [id number]  PCP:  Leonard Downing, MD  Cardiologist: Dr.Hochrein  CC: Atrial fibrillation   History of Present Illness: Amy Riddle is a 74 y.o. female who presents for ongoing assessment and management of atrial fibrillation, status post DCCV, but converted back to atrial fibrillation. CHADS VASC Score 4,  on Eliquis and diltiazem.  Other history includes type 2 diabetes, asthma, hypertension, and arthritis.  She was last seen in the office on 10/23/2019, heart rate was well controlled and she had no issues concerning bleeding or bruising.  No changes were made in her medication regimen.  No further testing was planned at that time.  Mrs. Fingar comes today with some complaints of shortness of breath.  She remains in atrial fibrillation however she is not rate controlled today.  She had increase on diltiazem on last office visit yet her heart rate remains elevated.  She has been compliant with Eliquis.  She denies any dizziness, chest pain, or significant fatigue while she is in atrial fibrillation.  She denies any bleeding issues.  Past Medical History:  Diagnosis Date  . Arthritis   . Asthma   . Diabetes mellitus without complication (HCC)    TYPE II no meds  . Eczema   . Fibroid   . Hypertension   . Osteoarthritis   . Ovarian neoplasm    PRODUCING TESTOSTERONE  . Submucous myoma of uterus     Past Surgical History:  Procedure Laterality Date  . CARDIOVERSION N/A 10/05/2019   Procedure: CARDIOVERSION;  Surgeon: Fay Records, MD;  Location: [place];  Service: Cardiovascular;  Laterality: N/A;  . CARPAL TUNNEL RELEASE Left   . CARPAL TUNNEL RELEASE Left 03/23/2015   Procedure: LEFT CARPAL TUNNEL RELEASE;  Surgeon: Leandrew Koyanagi, MD;  Location: [place];  Service: Orthopedics;  Laterality: Left;  . DERMOID CYST  EXCISION    . HERNIA REPAIR     X2  .  HYSTEROSCOPY     D & C  . PELVIC LAPAROSCOPY       Current Outpatient Medications  Medication Sig Dispense Refill  . acetaminophen (TYLENOL) 500 MG tablet Take 1,000 mg by mouth every 8 (eight) hours as needed for mild pain or moderate pain.    [name] ADVAIR DISKUS 250-50 MCG/DOSE AEPB Inhale 1 puff into the lungs 2 (two) times daily.    [name] albuterol (VENTOLIN HFA) 108 (90 Base) MCG/ACT inhaler Inhale 1-2 puffs into the lungs 2 (two) times daily as needed for shortness of breath.     . Clobetasol Prop Emollient Base 0.05 % emollient cream Apply 1 application topically daily as needed (eczema).     . diclofenac sodium (VOLTAREN) 1 % GEL Apply 2 g topically 2 (two) times daily.    [name] diltiazem (CARDIZEM CD) 240 MG 24 hr capsule Take 1 capsule (240 mg total) by mouth daily. 90 capsule 3  . ELIQUIS 5 MG TABS tablet Take 1 tablet (5 mg total) by mouth 2 (two) times daily. 90 tablet 3  . furosemide (LASIX) 40 MG tablet Take 40 mg by mouth 2 (two) times daily.     [name] losartan (COZAAR) 50 MG tablet Take 1 tablet (50 mg total) by mouth at bedtime. 90 tablet 3  . metFORMIN (GLUCOPHAGE) 1000 MG tablet Take 500-1,000 mg by mouth daily with breakfast. Take 1000 mg in the morning and 500 mg in  the evening    . nystatin cream (MYCOSTATIN) Apply 1 application topically daily as needed (yeast under breast).      No current facility-administered medications for this visit.    Allergies:   Tylenol arthritis ext [acetaminophen]    Social History:  The patient  reports that she quit smoking about 45 years ago. She has never used smokeless tobacco. She reports that she does not drink alcohol or use drugs.   Family History:  The patient's family history includes Breast cancer in her maternal aunt; Cancer in her father; Heart disease in her maternal grandfather and mother; Hypertension in her brother and maternal grandmother.    ROS: All other systems are reviewed and negative. Unless otherwise mentioned in H&P     PHYSICAL EXAM: VS:  BP 126/70   Pulse (!) 112   Ht 5\' 2"  (1.575 m)   Wt 272 lb (123.4 kg)   SpO2 94%   BMI 49.75 kg/m  , BMI Body mass index is 49.75 kg/m. GEN: Well nourished, well developed, in no acute distress, morbidly obese HEENT: normal Neck: no JVD, carotid bruits, or masses Cardiac: IRRR; no murmurs, rubs, or gallops,no edema  Respiratory:  Clear to auscultation bilaterally, normal work of breathing, some mild crackles in the bases GI: soft, nontender, nondistended, + BS MS: no deformity or atrophy Skin: warm and dry, no rash Neuro:  Strength and sensation are intact Psych: euthymic mood, full affect   EKG: Atrial fibrillation with RVR heart rate of 113 bpm, rightward axis is noted.  Recent Labs: 10/01/2019: BUN 16; Creatinine, Ser 0.70; Hemoglobin 15.8; Platelets 235; Potassium 4.2; Sodium 142    Lipid Panel No results found for: CHOL, TRIG, HDL, CHOLHDL, VLDL, LDLCALC, LDLDIRECT    Wt Readings from Last 3 Encounters:  12/28/19 272 lb (123.4 kg)  10/23/19 268 lb (121.6 kg)  10/05/19 270 lb (122.5 kg)      Other studies Reviewed: Echocardiogram 09/16/19 1. Left ventricular ejection fraction, by visual estimation, is 45 to 50%. The left ventricle has mildly decreased function. Normal left ventricular size. There is mildly increased left ventricular hypertrophy.  2. Left ventricular diastolic Doppler parameters are indeterminate pattern of LV diastolic filling.  3. Challenging to estimate EF. Rapid AFIB.  4. Global right ventricle has normal systolic function.The right ventricular size is normal. No increase in right ventricular wall thickness.  5. Left atrial size was mildly dilated.  6. Right atrial size was mildly dilated.  7. The mitral valve is normal in structure. No evidence of mitral valve regurgitation. No evidence of mitral stenosis.  8. The tricuspid valve is normal in structure. Tricuspid valve regurgitation was not visualized by color flow  Doppler.  9. The aortic valve is normal in structure. Aortic valve regurgitation was not visualized by color flow Doppler. Structurally normal aortic valve, with no evidence of sclerosis or stenosis. 10. The pulmonic valve was normal in structure. Pulmonic valve regurgitation is not visualized by color flow Doppler. 11. The inferior vena cava is normal in size with greater than 50% respiratory variability, suggesting right atrial pressure of 3 mmHg.    ASSESSMENT AND PLAN:  1.  Atrial fibrillation: Heart rate is not well controlled.  I have rechecked her heart rate apically after we have sat and talked for a while.  It remains elevated at 96 to 100 bpm.  As a result of this I will increase her diltiazem to 360 mg at at bedtime, but to avoid hypotension will decrease losartan  to 50 mg daily from 100 mg daily.  I am going to see her back in 2 weeks for an EKG and a blood pressure check and office visit to evaluate her response to medications.  She is to continue to take Eliquis 5 mg twice daily without fail to decrease risk of CVA.  2.  Hypertension: Blood pressure is well controlled currently.  As stated above, with increased dose of diltiazem, will decrease losartan to avoid hypotension.  She will continue on Lasix 40 mg twice daily.  Most recent labs reveal a creatinine of 0.70 on 08/01/2019.  3.  Morbid obesity: She has gained and lost 100 pounds several times but is unable to keep the weight off.  She is to increase her activity so that she may improve overall health and aid in weight loss.  4.  Chronic asthma: She remains on inhalers.  She will follow with PCP for ongoing management.  Would like to avoid beta-blockers for heart rate control in the setting.  If necessary would recommend bisoprolol as this is better tolerated and does with lung disease.  Current medicines are reviewed at length with the patient today.    Labs/ tests ordered today include: None  Phill Myron. West Pugh, ANP,  AACC   12/28/2019 4:50 PM    [facility] [address] Office [phone] Fax [phone]  Notice: This dictation was prepared with Dragon dictation along with smaller phrase technology. Any transcriptional errors that result from this process are unintentional and may not be corrected upon review.

## 2019-12-28 ENCOUNTER — Other Ambulatory Visit: Payer: Self-pay

## 2019-12-28 ENCOUNTER — Encounter: Payer: Self-pay | Admitting: Adult Health

## 2019-12-28 ENCOUNTER — Ambulatory Visit: Payer: 59 | Admitting: Adult Health

## 2019-12-28 VITALS — BP 126/70 | HR 112 | Ht 62.0 in | Wt 272.0 lb

## 2019-12-28 DIAGNOSIS — I10 Essential (primary) hypertension: Secondary | ICD-10-CM

## 2019-12-28 DIAGNOSIS — I4819 Other persistent atrial fibrillation: Secondary | ICD-10-CM | POA: Diagnosis not present

## 2019-12-28 DIAGNOSIS — J45909 Unspecified asthma, uncomplicated: Secondary | ICD-10-CM | POA: Diagnosis not present

## 2019-12-28 MED ORDER — LOSARTAN POTASSIUM 50 MG PO TABS: 50.0000 mg | ORAL_TABLET | Freq: Every day | ORAL | 3 refills | Status: DC

## 2019-12-28 NOTE — Patient Instructions (Addendum)
Medication Instructions:  Increase Diltiazem to 360mg  Daily at night Decrease Losartan 50mg  Daily  If you need a refill on your cardiac medications before your next appointment, please call your pharmacy.   Lab work: NONE  Testing/Procedures: NONE  Follow-Up: At [facility], you and your health needs are our priority.  As part of our continuing mission to provide you with exceptional heart care, we have created designated Provider Care Teams.  These Care Teams include your primary Cardiologist (physician) and Advanced Practice Providers (APPs -  Physician Assistants and Nurse Practitioners) who all work together to provide you with the care you need, when you need it. You may see Minus Breeding, MD or one of the following Advanced Practice Providers on your designated Care Team:    Kerin Ransom, PA-C  [place], [place]  Coletta Memos, [place]  Your physician wants you to follow-up in: 2 weeks with Jory Sims, NP for another EKG and Blood Pressure Check

## 2019-12-30 NOTE — Progress Notes (Deleted)
Cardiology Office Note   Date:  12/30/2019   ID:  Lenay, Amy Riddle, MRN [id number]  PCP:  Leonard Downing, MD  Cardiologist: Dr. Percival Spanish No chief complaint on file.    History of Present Illness: LADAJAH Riddle is a 74 y.o. female who presents for ongoing assessment and management of atrial fibrillation, he has a history of DCCV which was initially successful but he converted back to atrial fibrillation. CHADS VASC Score of 4 on Eliquis, diltiazem for heart rate control.  He has other history to include hypertension, type 2 diabetes, asthma, and arthritis.  He was last seen in the office on 12/28/2019 with complaints of shortness of breath.  EKG revealed elevated heart rate with uncontrolled atrial fibrillation.  Diltiazem was increased to 360 mg at bedtime, to avoid hypotension we decrease losartan to 50 mg daily from 100 mg daily.  She is here in follow-up to evaluate her response to medication changes.  She was to continue Eliquis twice daily.   Past Medical History:  Diagnosis Date  . Arthritis   . Asthma   . Diabetes mellitus without complication (HCC)    TYPE II no meds  . Eczema   . Fibroid   . Hypertension   . Osteoarthritis   . Ovarian neoplasm    PRODUCING TESTOSTERONE  . Submucous myoma of uterus     Past Surgical History:  Procedure Laterality Date  . CARDIOVERSION N/A 10/05/2019   Procedure: CARDIOVERSION;  Surgeon: Fay Records, MD;  Location: [place];  Service: Cardiovascular;  Laterality: N/A;  . CARPAL TUNNEL RELEASE Left   . CARPAL TUNNEL RELEASE Left 03/23/2015   Procedure: LEFT CARPAL TUNNEL RELEASE;  Surgeon: Leandrew Koyanagi, MD;  Location: [place];  Service: Orthopedics;  Laterality: Left;  . DERMOID CYST  EXCISION    . HERNIA REPAIR     X2  . HYSTEROSCOPY     D & C  . PELVIC LAPAROSCOPY       Current Outpatient Medications  Medication Sig Dispense Refill  . acetaminophen (TYLENOL) 500 MG tablet  Take 1,000 mg by mouth every 8 (eight) hours as needed for mild pain or moderate pain.    [name] ADVAIR DISKUS 250-50 MCG/DOSE AEPB Inhale 1 puff into the lungs 2 (two) times daily.    [name] albuterol (VENTOLIN HFA) 108 (90 Base) MCG/ACT inhaler Inhale 1-2 puffs into the lungs 2 (two) times daily as needed for shortness of breath.     . Clobetasol Prop Emollient Base 0.05 % emollient cream Apply 1 application topically daily as needed (eczema).     . diclofenac sodium (VOLTAREN) 1 % GEL Apply 2 g topically 2 (two) times daily.    [name] diltiazem (CARDIZEM CD) 240 MG 24 hr capsule Take 1 capsule (240 mg total) by mouth daily. 90 capsule 3  . ELIQUIS 5 MG TABS tablet Take 1 tablet (5 mg total) by mouth 2 (two) times daily. 90 tablet 3  . furosemide (LASIX) 40 MG tablet Take 40 mg by mouth 2 (two) times daily.     [name] losartan (COZAAR) 50 MG tablet Take 1 tablet (50 mg total) by mouth at bedtime. 90 tablet 3  . metFORMIN (GLUCOPHAGE) 1000 MG tablet Take 500-1,000 mg by mouth daily with breakfast. Take 1000 mg in the morning and 500 mg in the evening    . nystatin cream (MYCOSTATIN) Apply 1 application topically daily as needed (yeast under breast).  No current facility-administered medications for this visit.    Allergies:   Tylenol arthritis ext [acetaminophen]    Social History:  The patient  reports that she quit smoking about 45 years ago. She has never used smokeless tobacco. She reports that she does not drink alcohol or use drugs.   Family History:  The patient's family history includes Breast cancer in her maternal aunt; Cancer in her father; Heart disease in her maternal grandfather and mother; Hypertension in her brother and maternal grandmother.    ROS: All other systems are reviewed and negative. Unless otherwise mentioned in H&P    PHYSICAL EXAM: VS:  There were no vitals taken for this visit. , BMI There is no height or weight on file to calculate BMI. GEN: Well nourished, well developed,  in no acute distress HEENT: normal Neck: no JVD, carotid bruits, or masses Cardiac: ***RRR; no murmurs, rubs, or gallops,no edema  Respiratory:  Clear to auscultation bilaterally, normal work of breathing GI: soft, nontender, nondistended, + BS MS: no deformity or atrophy Skin: warm and dry, no rash Neuro:  Strength and sensation are intact Psych: euthymic mood, full affect   EKG:  EKG {ACTION; IS/IS [id number] ordered today. The ekg ordered today demonstrates ***   Recent Labs: 10/01/2019: BUN 16; Creatinine, Ser 0.70; Hemoglobin 15.8; Platelets 235; Potassium 4.2; Sodium 142    Lipid Panel No results found for: CHOL, TRIG, HDL, CHOLHDL, VLDL, LDLCALC, LDLDIRECT    Wt Readings from Last 3 Encounters:  12/28/19 272 lb (123.4 kg)  10/23/19 268 lb (121.6 kg)  10/05/19 270 lb (122.5 kg)      Other studies Reviewed: Echocardiogram 09-21-19 1. Left ventricular ejection fraction, by visual estimation, is 45 to 50%. The left ventricle has mildly decreased function. Normal left ventricular size. There is mildly increased left ventricular hypertrophy. 2. Left ventricular diastolic Doppler parameters are indeterminate pattern of LV diastolic filling. 3. Challenging to estimate EF. Rapid AFIB. 4. Global right ventricle has normal systolic function.The right ventricular size is normal. No increase in right ventricular wall thickness. 5. Left atrial size was mildly dilated. 6. Right atrial size was mildly dilated. 7. The mitral valve is normal in structure. No evidence of mitral valve regurgitation. No evidence of mitral stenosis. 8. The tricuspid valve is normal in structure. Tricuspid valve regurgitation was not visualized by color flow Doppler. 9. The aortic valve is normal in structure. Aortic valve regurgitation was not visualized by color flow Doppler. Structurally normal aortic valve, with no evidence of sclerosis or stenosis. 10. The pulmonic valve was normal in  structure. Pulmonic valve regurgitation is not visualized by color flow Doppler.   ASSESSMENT AND PLAN:  1.  ***   Current medicines are reviewed at length with the patient today.    Labs/ tests ordered today include: *** Phill Myron. West Pugh, ANP, [facility]   12/30/2019 9:19 AM    [facility] [place] [address] Office [phone] Fax [phone]  Notice: This dictation was prepared with Dragon dictation along with smaller phrase technology. Any transcriptional errors that result from this process are unintentional and may not be corrected upon review.

## 2020-01-04 ENCOUNTER — Ambulatory Visit: Payer: 59 | Admitting: Adult Health

## 2020-01-06 ENCOUNTER — Telehealth: Payer: Self-pay | Admitting: Cardiology

## 2020-01-06 NOTE — Telephone Encounter (Signed)
We are recommending the COVID-19 vaccine to all of our patients. Cardiac medications (including blood thinners) should not deter anyone from being vaccinated and there is no need to hold any of those medications prior to vaccine administration.     Currently, there is a hotline to call (active 12/11/19) to schedule vaccination appointments as no walk-ins will be accepted.   Number: [phone].    If an appointment is not available please go to [email or web address] to sign up for notification when additional vaccine appointments are available.   If you have further questions or concerns about the vaccine process, please visit [email or web address] or contact your primary care physician.   

## 2020-01-17 NOTE — Progress Notes (Signed)
Cardiology Office Note   Date:  01/18/2020   ID:  Riddle, Amy Riddle 26, 1947, MRN [id number]  PCP:  Leonard Downing, MD  Cardiologist:  Percival Spanish  CC: Hypertension   History of Present Illness: Amy Riddle is a 74 y.o. female who presents for ongoing assessment and management of atrial fibrillation, status post DCCV, but converted back to atrial fibrillation. CHADS VASC Score 4,  on Eliquis and diltiazem.  Other history includes type 2 diabetes, asthma, hypertension, and arthritis.  She was last seen in the office on 10/23/2019, heart rate was well controlled and she had no issues concerning bleeding or bruising.  No changes were made in her medication regimen.  No further testing was planned at that time.  When seen last on 12/28/2019 the patient complained of shortness of breath and was found to have some mildly elevated heart rates, with uncontrolled atrial fibrillation.Marland Riddle  She was medically compliant.  Due to uncontrolled heart rate, I increased her diltiazem to 360 mg at bedtime but to avoid hypotension I decreased her losartan to 50 mg daily from 100 mg daily.  She is here for repeat EKG and blood pressure check to evaluate her response to medication changes.  She is to continue to take Eliquis twice daily as directed.  She was also to continue taking Lasix 40 mg twice daily.  Most recent labs revealed a creatinine of 0.70.  If heart rate was not better controlled if she was unable to tolerate her increased dose of diltiazem, consideration for adding bisoprolol will be had.  She has a history of chronic asthma and heart rate may have also been related to steroid inhalers.  She comes today feeling much better.  She is tolerating higher dose of diltiazem.  She does have some mild lower extremity edema which occurs by the end of the day with higher dose of calcium channel blocker.  And she also has had some constipation.  She is taking Colace which is helpful to her.  She  denies dizziness, positional lightheadedness, or shortness of breath.  Past Medical History:  Diagnosis Date  . Arthritis   . Asthma   . Diabetes mellitus without complication (HCC)    TYPE II no meds  . Eczema   . Fibroid   . Hypertension   . Osteoarthritis   . Ovarian neoplasm    PRODUCING TESTOSTERONE  . Submucous myoma of uterus     Past Surgical History:  Procedure Laterality Date  . CARDIOVERSION N/A 10/05/2019   Procedure: CARDIOVERSION;  Surgeon: Fay Records, MD;  Location: [facility];  Service: Cardiovascular;  Laterality: N/A;  . CARPAL TUNNEL RELEASE Left   . CARPAL TUNNEL RELEASE Left 03/23/2015   Procedure: LEFT CARPAL TUNNEL RELEASE;  Surgeon: Leandrew Koyanagi, MD;  Location: [facility];  Service: Orthopedics;  Laterality: Left;  . DERMOID CYST  EXCISION    . HERNIA REPAIR     X2  . HYSTEROSCOPY     D & C  . PELVIC LAPAROSCOPY       Current Outpatient Medications  Medication Sig Dispense Refill  . acetaminophen (TYLENOL) 500 MG tablet Take 1,000 mg by mouth every 8 (eight) hours as needed for mild pain or moderate pain.    Marland Riddle ADVAIR DISKUS 250-50 MCG/DOSE AEPB Inhale 1 puff into the lungs 2 (two) times daily.    Marland Riddle albuterol (VENTOLIN HFA) 108 (90 Base) MCG/ACT inhaler Inhale 1-2 puffs into the lungs 2 (two) times daily  as needed for shortness of breath.     . Clobetasol Prop Emollient Base 0.05 % emollient cream Apply 1 application topically daily as needed (eczema).     . diclofenac sodium (VOLTAREN) 1 % GEL Apply 2 g topically 2 (two) times daily.    Marland Riddle diltiazem (CARDIZEM CD) 240 MG 24 hr capsule Take 1 capsule (240 mg total) by mouth daily. 90 capsule 3  . ELIQUIS 5 MG TABS tablet Take 1 tablet (5 mg total) by mouth 2 (two) times daily. 90 tablet 3  . furosemide (LASIX) 40 MG tablet Take 40 mg by mouth 2 (two) times daily.     Marland Riddle losartan (COZAAR) 50 MG tablet Take 1 tablet (50 mg total) by mouth at bedtime. 90 tablet 3  . metFORMIN (GLUCOPHAGE)  1000 MG tablet Take 500-1,000 mg by mouth daily with breakfast. Take 1000 mg in the morning and 500 mg in the evening    . nystatin cream (MYCOSTATIN) Apply 1 application topically daily as needed (yeast under breast).      No current facility-administered medications for this visit.    Allergies:   Tylenol arthritis ext [acetaminophen]    Social History:  The patient  reports that she quit smoking about 45 years ago. She has never used smokeless tobacco. She reports that she does not drink alcohol or use drugs.   Family History:  The patient's family history includes Breast cancer in her maternal aunt; Cancer in her father; Heart disease in her maternal grandfather and mother; Hypertension in her brother and maternal grandmother.    ROS: All other systems are reviewed and negative. Unless otherwise mentioned in H&P    PHYSICAL EXAM: VS:  BP 122/90   Pulse (!) 103   Ht 5\' 2"  (1.575 m)   Wt 276 lb 6.4 oz (125.4 kg)   SpO2 92%   BMI 50.55 kg/m  , BMI Body mass index is 50.55 kg/m. GEN: Well nourished, well developed, in no acute distress, morbidly obese HEENT: normal Neck: no JVD, carotid bruits, or masses Cardiac: Irregular RRR; no murmurs, rubs, or gallops,no edema  Respiratory:  Clear to auscultation bilaterally, normal work of breathing GI: soft, nontender, nondistended, + BS MS: no deformity or atrophy Skin: warm and dry, no rash Neuro:  Strength and sensation are intact Psych: euthymic mood, full affect   EKG: Personally reviewed. Atrial fibrillation heart rate of 103.  Rightward axis, (Auscultated heart rate of 87.)  Recent Labs: 10/01/2019: BUN 16; Creatinine, Ser 0.70; Hemoglobin 15.8; Platelets 235; Potassium 4.2; Sodium 142    Lipid Panel No results found for: CHOL, TRIG, HDL, CHOLHDL, VLDL, LDLCALC, LDLDIRECT    Wt Readings from Last 3 Encounters:  01/18/20 276 lb 6.4 oz (125.4 kg)  12/28/19 272 lb (123.4 kg)  10/23/19 268 lb (121.6 kg)      Other  studies Reviewed: Echocardiogram September 05, 2019 1. Left ventricular ejection fraction, by visual estimation, is 45 to 50%. The left ventricle has mildly decreased function. Normal left ventricular size. There is mildly increased left ventricular hypertrophy. 2. Left ventricular diastolic Doppler parameters are indeterminate pattern of LV diastolic filling. 3. Challenging to estimate EF. Rapid AFIB. 4. Global right ventricle has normal systolic function.The right ventricular size is normal. No increase in right ventricular wall thickness. 5. Left atrial size was mildly dilated. 6. Right atrial size was mildly dilated. 7. The mitral valve is normal in structure. No evidence of mitral valve regurgitation. No evidence of mitral stenosis. 8. The tricuspid valve  is normal in structure. Tricuspid valve regurgitation was not visualized by color flow Doppler. 9. The aortic valve is normal in structure. Aortic valve regurgitation was not visualized by color flow Doppler. Structurally normal aortic valve, with no evidence of sclerosis or stenosis. 10. The pulmonic valve was normal in structure. Pulmonic valve regurgitation is not visualized by color flow Doppler. 11. The inferior vena cava is normal in size with greater than 50% respiratory variability, suggesting right atrial pressure of 3 mmHg.   ASSESSMENT AND PLAN:  1.  Atrial fibrillation: Rate is controlled at rest.  Slightly elevated after walking into the clinic room today.  Once she settled down heart rate normalized.  Blood pressure is well controlled.  We will continue with diltiazem 240 mg daily.  She did have some constipation with higher dose and is taking Colace which is helpful to her.  Continue Eliquis 5 mg twice daily.  2.  Hypertension: Blood pressure controlled and has not become too low with increased dose of diltiazem.  She continues on losartan 50 mg daily.  And Lasix twice daily.  No changes in her regimen at this time.  3.   Non-insulin-dependent diabetes: Continues on Metformin 1000 mg in the a.m. and 500 mg in the p.m.  Followed by PCP.    4.  Asthma: Continues on Advair and albuterol as needed.  May consider changing to Xopenex as this has less effect on heart rate.  Will defer to pulmonology.  Current medicines are reviewed at length with the patient today.  I have spent 30 minutes. dedicated to the care of this patient on the date of this encounter to include pre-visit review of records, assessment, management and diagnostic testing,with shared decision making.  Labs/ tests ordered today include: None  Phill Myron. West Pugh, ANP, AACC   01/18/2020 4:39 PM    [facility] [place] [address] Office [phone] Fax [phone]  Notice: This dictation was prepared with Dragon dictation along with smaller phrase technology. Any transcriptional errors that result from this process are unintentional and may not be corrected upon review.

## 2020-01-18 ENCOUNTER — Other Ambulatory Visit: Payer: Self-pay

## 2020-01-18 ENCOUNTER — Encounter: Payer: Self-pay | Admitting: Adult Health

## 2020-01-18 ENCOUNTER — Ambulatory Visit: Payer: 59 | Admitting: Adult Health

## 2020-01-18 VITALS — BP 122/90 | HR 103 | Ht 62.0 in | Wt 276.4 lb

## 2020-01-18 DIAGNOSIS — I10 Essential (primary) hypertension: Secondary | ICD-10-CM

## 2020-01-18 DIAGNOSIS — I4821 Permanent atrial fibrillation: Secondary | ICD-10-CM | POA: Diagnosis not present

## 2020-01-18 DIAGNOSIS — J4520 Mild intermittent asthma, uncomplicated: Secondary | ICD-10-CM | POA: Diagnosis not present

## 2020-01-18 NOTE — Patient Instructions (Signed)
Medication Instructions:  Continue current medications  *If you need a refill on your cardiac medications before your next appointment, please call your pharmacy*  Lab Work: None Ordered  Testing/Procedures: None Ordered  Follow-Up: At [facility], you and your health needs are our priority.  As part of our continuing mission to provide you with exceptional heart care, we have created designated Provider Care Teams.  These Care Teams include your primary Cardiologist (physician) and Advanced Practice Providers (APPs -  Physician Assistants and Nurse Practitioners) who all work together to provide you with the care you need, when you need it.  Your next appointment:   6 month(s)  The format for your next appointment:   In Person  Provider:   You may see Minus Breeding, MD or one of the following Advanced Practice Providers on your designated Care Team:    Rosaria Ferries, PA-C  Jory Sims, DNP, ANP  Cadence Kathlen Mody, NP

## 2020-01-27 ENCOUNTER — Telehealth: Payer: Self-pay | Admitting: Cardiology

## 2020-01-27 ENCOUNTER — Other Ambulatory Visit: Payer: Self-pay

## 2020-01-27 MED ORDER — DILTIAZEM HCL ER COATED BEADS 360 MG PO CP24: 360.0000 mg | ORAL_CAPSULE | Freq: Every day | ORAL | 3 refills | Status: DC

## 2020-01-27 NOTE — Telephone Encounter (Signed)
New Message   Pt c/o medication issue:  1. Name of Medication:  diltiazem (CARDIZEM CD) 240 MG 24 hr capsule(Expired)  2. How are you currently taking this medication (dosage and times per day)? Pt states she was taking 300 mg  Taking 3 capsules daily   3. Are you having a reaction (difficulty breathing--STAT)? No   4. What is your medication issue? Pt is calling and says she got this prescription from the pharmacy and it is 240 mg 1 x daily.  Pt says she is sure she was taking 300 mg or more and was taking 3 capsules  She is wanting to be sure on how she should be taking the medication     Please call

## 2020-01-27 NOTE — Progress Notes (Signed)
Pt states the wrong Rx for diltiazem was filled and she realized it was the wrong dosage of 240mg   Per Verdell Carmine, NP last OV note from 2/15 she increased the diltiazem dosage to 360mg .   240mg  rx canceled and new order written for 360mg /daily.  Pt verbalized understanding.

## 2020-03-09 ENCOUNTER — Other Ambulatory Visit: Payer: Self-pay

## 2020-03-09 MED ORDER — ELIQUIS 5 MG PO TABS: 5.0000 mg | ORAL_TABLET | Freq: Two times a day (BID) | ORAL | 1 refills | Status: DC

## 2020-05-06 ENCOUNTER — Ambulatory Visit
Admission: RE | Admit: 2020-05-06 | Discharge: 2020-05-06 | Disposition: A | Discharge status: Home / Self Care | Payer: 59 | Source: Ambulatory Visit | Attending: Family Medicine | Admitting: Family Medicine

## 2020-05-06 ENCOUNTER — Other Ambulatory Visit: Payer: Self-pay | Admitting: Family Medicine

## 2020-05-06 DIAGNOSIS — R0602 Shortness of breath: Secondary | ICD-10-CM

## 2020-05-06 DIAGNOSIS — R059 Cough, unspecified: Secondary | ICD-10-CM

## 2020-05-10 ENCOUNTER — Other Ambulatory Visit: Payer: Self-pay

## 2020-05-10 ENCOUNTER — Inpatient Hospital Stay (HOSPITAL_COMMUNITY)
Admission: EM | Admit: 2020-05-10 | Discharge: 2020-05-20 | DRG: 291 | Disposition: A | Discharge status: Home Health | Payer: Medicare Other | Attending: Internal Medicine | Admitting: Internal Medicine

## 2020-05-10 ENCOUNTER — Emergency Department (HOSPITAL_COMMUNITY): Payer: Medicare Other

## 2020-05-10 DIAGNOSIS — J4521 Mild intermittent asthma with (acute) exacerbation: Secondary | ICD-10-CM | POA: Diagnosis present

## 2020-05-10 DIAGNOSIS — Z6841 Body Mass Index (BMI) 40.0 and over, adult: Secondary | ICD-10-CM

## 2020-05-10 DIAGNOSIS — J90 Pleural effusion, not elsewhere classified: Secondary | ICD-10-CM

## 2020-05-10 DIAGNOSIS — Z7984 Long term (current) use of oral hypoglycemic drugs: Secondary | ICD-10-CM

## 2020-05-10 DIAGNOSIS — Z20822 Contact with and (suspected) exposure to covid-19: Secondary | ICD-10-CM | POA: Diagnosis present

## 2020-05-10 DIAGNOSIS — K7460 Unspecified cirrhosis of liver: Secondary | ICD-10-CM | POA: Diagnosis present

## 2020-05-10 DIAGNOSIS — I5023 Acute on chronic systolic (congestive) heart failure: Secondary | ICD-10-CM | POA: Diagnosis not present

## 2020-05-10 DIAGNOSIS — Z87891 Personal history of nicotine dependence: Secondary | ICD-10-CM

## 2020-05-10 DIAGNOSIS — Z515 Encounter for palliative care: Secondary | ICD-10-CM | POA: Diagnosis not present

## 2020-05-10 DIAGNOSIS — N390 Urinary tract infection, site not specified: Secondary | ICD-10-CM | POA: Diagnosis not present

## 2020-05-10 DIAGNOSIS — E662 Morbid (severe) obesity with alveolar hypoventilation: Secondary | ICD-10-CM | POA: Diagnosis not present

## 2020-05-10 DIAGNOSIS — J9611 Chronic respiratory failure with hypoxia: Secondary | ICD-10-CM | POA: Diagnosis not present

## 2020-05-10 DIAGNOSIS — I509 Heart failure, unspecified: Secondary | ICD-10-CM | POA: Diagnosis not present

## 2020-05-10 DIAGNOSIS — R0603 Acute respiratory distress: Secondary | ICD-10-CM

## 2020-05-10 DIAGNOSIS — E119 Type 2 diabetes mellitus without complications: Secondary | ICD-10-CM | POA: Diagnosis present

## 2020-05-10 DIAGNOSIS — K7030 Alcoholic cirrhosis of liver without ascites: Secondary | ICD-10-CM | POA: Diagnosis not present

## 2020-05-10 DIAGNOSIS — Z79899 Other long term (current) drug therapy: Secondary | ICD-10-CM | POA: Diagnosis not present

## 2020-05-10 DIAGNOSIS — R451 Restlessness and agitation: Secondary | ICD-10-CM | POA: Diagnosis present

## 2020-05-10 DIAGNOSIS — R0902 Hypoxemia: Secondary | ICD-10-CM | POA: Diagnosis not present

## 2020-05-10 DIAGNOSIS — N179 Acute kidney failure, unspecified: Secondary | ICD-10-CM

## 2020-05-10 DIAGNOSIS — J9621 Acute and chronic respiratory failure with hypoxia: Secondary | ICD-10-CM | POA: Diagnosis present

## 2020-05-10 DIAGNOSIS — Z7189 Other specified counseling: Secondary | ICD-10-CM

## 2020-05-10 DIAGNOSIS — I4821 Permanent atrial fibrillation: Secondary | ICD-10-CM | POA: Diagnosis present

## 2020-05-10 DIAGNOSIS — I5021 Acute systolic (congestive) heart failure: Secondary | ICD-10-CM | POA: Diagnosis not present

## 2020-05-10 DIAGNOSIS — Z66 Do not resuscitate: Secondary | ICD-10-CM | POA: Diagnosis present

## 2020-05-10 DIAGNOSIS — R069 Unspecified abnormalities of breathing: Secondary | ICD-10-CM

## 2020-05-10 DIAGNOSIS — E873 Alkalosis: Secondary | ICD-10-CM | POA: Diagnosis not present

## 2020-05-10 DIAGNOSIS — E875 Hyperkalemia: Secondary | ICD-10-CM | POA: Diagnosis present

## 2020-05-10 DIAGNOSIS — Z7901 Long term (current) use of anticoagulants: Secondary | ICD-10-CM

## 2020-05-10 DIAGNOSIS — Z8249 Family history of ischemic heart disease and other diseases of the circulatory system: Secondary | ICD-10-CM | POA: Diagnosis not present

## 2020-05-10 DIAGNOSIS — I110 Hypertensive heart disease with heart failure: Principal | ICD-10-CM | POA: Diagnosis present

## 2020-05-10 DIAGNOSIS — J9612 Chronic respiratory failure with hypercapnia: Secondary | ICD-10-CM | POA: Diagnosis not present

## 2020-05-10 DIAGNOSIS — R000 Tachycardia, unspecified: Secondary | ICD-10-CM | POA: Diagnosis present

## 2020-05-10 DIAGNOSIS — M7989 Other specified soft tissue disorders: Secondary | ICD-10-CM | POA: Diagnosis not present

## 2020-05-10 DIAGNOSIS — Z7951 Long term (current) use of inhaled steroids: Secondary | ICD-10-CM | POA: Diagnosis not present

## 2020-05-10 DIAGNOSIS — I5033 Acute on chronic diastolic (congestive) heart failure: Secondary | ICD-10-CM | POA: Diagnosis not present

## 2020-05-10 DIAGNOSIS — J9602 Acute respiratory failure with hypercapnia: Secondary | ICD-10-CM | POA: Diagnosis not present

## 2020-05-10 DIAGNOSIS — Z886 Allergy status to analgesic agent status: Secondary | ICD-10-CM

## 2020-05-10 DIAGNOSIS — I959 Hypotension, unspecified: Secondary | ICD-10-CM | POA: Diagnosis present

## 2020-05-10 DIAGNOSIS — J9622 Acute and chronic respiratory failure with hypercapnia: Secondary | ICD-10-CM | POA: Diagnosis not present

## 2020-05-10 DIAGNOSIS — J45901 Unspecified asthma with (acute) exacerbation: Secondary | ICD-10-CM | POA: Diagnosis not present

## 2020-05-10 DIAGNOSIS — Z781 Physical restraint status: Secondary | ICD-10-CM

## 2020-05-10 LAB — CBC WITH DIFFERENTIAL/PLATELET
Abs Immature Granulocytes: 0.38 10*3/uL — ABNORMAL HIGH (ref 0.00–0.07)
Basophils Absolute: 0.1 10*3/uL (ref 0.0–0.1)
Basophils Relative: 0 %
Eosinophils Absolute: 0 10*3/uL (ref 0.0–0.5)
Eosinophils Relative: 0 %
HCT: 52.5 % — ABNORMAL HIGH (ref 36.0–46.0)
Hemoglobin: 16.3 g/dL — ABNORMAL HIGH (ref 12.0–15.0)
Immature Granulocytes: 3 %
Lymphocytes Relative: 7 %
Lymphs Abs: 0.9 10*3/uL (ref 0.7–4.0)
MCH: 27.2 pg (ref 26.0–34.0)
MCHC: 31 g/dL (ref 30.0–36.0)
MCV: 87.5 fL (ref 80.0–100.0)
Monocytes Absolute: 1.4 10*3/uL — ABNORMAL HIGH (ref 0.1–1.0)
Monocytes Relative: 10 %
Neutro Abs: 10.3 10*3/uL — ABNORMAL HIGH (ref 1.7–7.7)
Neutrophils Relative %: 80 %
Platelets: 344 10*3/uL (ref 150–400)
RBC: 6 MIL/uL — ABNORMAL HIGH (ref 3.87–5.11)
RDW: 17.7 % — ABNORMAL HIGH (ref 11.5–15.5)
WBC: 13.1 10*3/uL — ABNORMAL HIGH (ref 4.0–10.5)
nRBC: 2.8 % — ABNORMAL HIGH (ref 0.0–0.2)

## 2020-05-10 LAB — COMPREHENSIVE METABOLIC PANEL
ALT: 36 U/L (ref 0–44)
AST: 22 U/L (ref 15–41)
Albumin: 3.7 g/dL (ref 3.5–5.0)
Alkaline Phosphatase: 49 U/L (ref 38–126)
Anion gap: 18 — ABNORMAL HIGH (ref 5–15)
BUN: 95 mg/dL — ABNORMAL HIGH (ref 8–23)
CO2: 27 mmol/L (ref 22–32)
Calcium: 9.2 mg/dL (ref 8.9–10.3)
Chloride: 92 mmol/L — ABNORMAL LOW (ref 98–111)
Creatinine, Ser: 2.69 mg/dL — ABNORMAL HIGH (ref 0.44–1.00)
GFR calc Af Amer: 20 mL/min — ABNORMAL LOW (ref >60)
GFR calc non Af Amer: 17 mL/min — ABNORMAL LOW (ref >60)
Glucose, Bld: 142 mg/dL — ABNORMAL HIGH (ref 70–99)
Potassium: 4.7 mmol/L (ref 3.5–5.1)
Sodium: 137 mmol/L (ref 135–145)
Total Bilirubin: 1.7 mg/dL — ABNORMAL HIGH (ref 0.3–1.2)
Total Protein: 6.5 g/dL (ref 6.5–8.1)

## 2020-05-10 LAB — BRAIN NATRIURETIC PEPTIDE: B Natriuretic Peptide: 605.4 pg/mL — ABNORMAL HIGH (ref 0.0–100.0)

## 2020-05-10 LAB — SARS CORONAVIRUS 2 BY RT PCR (HOSPITAL ORDER, PERFORMED IN ~~LOC~~ HOSPITAL LAB): SARS Coronavirus 2: NEGATIVE

## 2020-05-10 MED ORDER — IPRATROPIUM-ALBUTEROL 0.5-2.5 (3) MG/3ML IN SOLN
3.0000 mL | Freq: Once | RESPIRATORY_TRACT | Status: AC
  Administered 2020-05-10: 3 mL via RESPIRATORY_TRACT
  Filled 2020-05-10: qty 3

## 2020-05-10 MED ORDER — SODIUM CHLORIDE 0.9% FLUSH
3.0000 mL | Freq: Two times a day (BID) | INTRAVENOUS | Status: DC
  Administered 2020-05-11 – 2020-05-19 (×16): 3 mL via INTRAVENOUS

## 2020-05-10 MED ORDER — ALBUTEROL SULFATE HFA 108 (90 BASE) MCG/ACT IN AERS
2.0000 | INHALATION_SPRAY | Freq: Four times a day (QID) | RESPIRATORY_TRACT | Status: DC
  Administered 2020-05-10: 2 via RESPIRATORY_TRACT

## 2020-05-10 MED ORDER — SODIUM CHLORIDE 0.9% FLUSH: 3.0000 mL | INTRAVENOUS | Status: DC | PRN

## 2020-05-10 MED ORDER — LEVALBUTEROL HCL 1.25 MG/0.5ML IN NEBU: 1.2500 mg | INHALATION_SOLUTION | Freq: Four times a day (QID) | RESPIRATORY_TRACT | Status: DC | PRN

## 2020-05-10 MED ORDER — ONDANSETRON HCL 4 MG/2ML IJ SOLN
4.0000 mg | Freq: Four times a day (QID) | INTRAMUSCULAR | Status: DC | PRN
  Administered 2020-05-14: 4 mg via INTRAVENOUS
  Filled 2020-05-10: qty 2

## 2020-05-10 MED ORDER — SODIUM CHLORIDE 0.9 % IV SOLN: 250.0000 mL | INTRAVENOUS | Status: DC | PRN

## 2020-05-10 MED ORDER — METHYLPREDNISOLONE SODIUM SUCC 125 MG IJ SOLR
125.0000 mg | Freq: Once | INTRAMUSCULAR | Status: AC
  Administered 2020-05-10: 125 mg via INTRAVENOUS
  Filled 2020-05-10: qty 2

## 2020-05-10 MED ORDER — FUROSEMIDE 10 MG/ML IJ SOLN
40.0000 mg | Freq: Two times a day (BID) | INTRAMUSCULAR | Status: DC
  Administered 2020-05-11: 40 mg via INTRAVENOUS
  Filled 2020-05-10: qty 4

## 2020-05-10 MED ORDER — ACETAMINOPHEN 325 MG PO TABS
650.0000 mg | ORAL_TABLET | ORAL | Status: DC | PRN
  Administered 2020-05-12: 650 mg via ORAL
  Filled 2020-05-10 (×2): qty 2

## 2020-05-10 MED ORDER — ALBUTEROL SULFATE HFA 108 (90 BASE) MCG/ACT IN AERS
INHALATION_SPRAY | RESPIRATORY_TRACT | Status: AC
  Filled 2020-05-10: qty 6.7

## 2020-05-10 MED ORDER — FUROSEMIDE 10 MG/ML IJ SOLN
40.0000 mg | Freq: Once | INTRAMUSCULAR | Status: AC
  Administered 2020-05-10: 40 mg via INTRAVENOUS
  Filled 2020-05-10: qty 4

## 2020-05-10 NOTE — ED Notes (Signed)
Elenore Rota, pt's husband would like an update in AM. [phone] home.

## 2020-05-10 NOTE — ED Triage Notes (Signed)
Pt arrives via [facility] stretcher on CPAP for SOB and decreased O2 sats. Pt reports 2 weeks of increasing SOB. Pt was found with O2 sat 72-74% on RA. Pt A+Ox4 on arrival. Reports taking lasix at home.

## 2020-05-10 NOTE — ED Provider Notes (Signed)
[facility] EMERGENCY DEPARTMENT Provider Note   CSN: [id number] Arrival date & time: 05/10/20  2004     History Chief Complaint  Patient presents with  . Shortness of Breath    Amy Riddle is a 74 y.o. female.  HPI    Patient presents in respiratory distress actively receiving CPAP.  She arrives via EMS and history is obtained by those individuals, and subsequently by the patient after she has improvement. Similarly patient has history multiple medical issues, including asthma, A. fib, takes anticoagulant.  Reportedly she does not have heart failure, but does take Lasix. Symptoms began about 3 days ago, with dyspnea. Since that time symptoms have been progressive. EMS reports that on their arrival the patient was hypoxic, with saturation of 70% on room air.  Much improved with CPAP provided in route. Patient self denies pain, fever, nausea, vomiting. Past Medical History:  Diagnosis Date  . Arthritis   . Asthma   . Diabetes mellitus without complication (HCC)    TYPE II no meds  . Eczema   . Fibroid   . Hypertension   . Osteoarthritis   . Ovarian neoplasm    PRODUCING TESTOSTERONE  . Submucous myoma of uterus     Patient Active Problem List   Diagnosis Date Noted  . Atrial fibrillation ([place]) 08/28/2019  . Fibroid uterus 04/22/2013  . Postmenopausal bleeding 04/22/2013  . HTN (hypertension) 03/26/2013  . Diabetes ([place]) 03/26/2013  . Morbid obesity ([place]) 03/26/2013    Past Surgical History:  Procedure Laterality Date  . CARDIOVERSION N/A 10/05/2019   Procedure: CARDIOVERSION;  Surgeon: Fay Records, MD;  Location: [facility];  Service: Cardiovascular;  Laterality: N/A;  . CARPAL TUNNEL RELEASE Left   . CARPAL TUNNEL RELEASE Left 03/23/2015   Procedure: LEFT CARPAL TUNNEL RELEASE;  Surgeon: Leandrew Koyanagi, MD;  Location: [facility];  Service: Orthopedics;  Laterality: Left;  . DERMOID CYST  EXCISION    . HERNIA REPAIR       X2  . HYSTEROSCOPY     D & C  . PELVIC LAPAROSCOPY       OB History    Gravida  0   Para      Term      Preterm      AB      Living        SAB      TAB      Ectopic      Multiple      Live Births              Family History  Problem Relation Age of Onset  . Heart disease Mother   . Cancer Father        PANCREATIC  . Hypertension Brother   . Breast cancer Maternal Aunt   . Hypertension Maternal Grandmother   . Heart disease Maternal Grandfather     Social History   Tobacco Use  . Smoking status: Former Smoker    Quit date: 03/26/1974    Years since quitting: 46.1  . Smokeless tobacco: Never Used  Substance Use Topics  . Alcohol use: No  . Drug use: No    Home Medications Prior to Admission medications   Medication Sig Start Date End Date Taking? Authorizing Provider  acetaminophen (TYLENOL) 500 MG tablet Take 1,000 mg by mouth every 8 (eight) hours as needed for mild pain or moderate pain.    [provider]  ADVAIR  DISKUS 250-50 MCG/DOSE AEPB Inhale 1 puff into the lungs 2 (two) times daily. 07/28/19   [provider]  albuterol (VENTOLIN HFA) 108 (90 Base) MCG/ACT inhaler Inhale 1-2 puffs into the lungs 2 (two) times daily as needed for shortness of breath.  07/24/19   [provider]  Clobetasol Prop Emollient Base 0.05 % emollient cream Apply 1 application topically daily as needed (eczema).     [provider]  diclofenac sodium (VOLTAREN) 1 % GEL Apply 2 g topically 2 (two) times daily.    [provider]  diltiazem (CARDIZEM CD) 360 MG 24 hr capsule Take 1 capsule (360 mg total) by mouth daily. 01/27/20   Lendon Colonel, NP  ELIQUIS 5 MG TABS tablet Take 1 tablet (5 mg total) by mouth 2 (two) times daily. 03/09/20   Minus Breeding, MD  furosemide (LASIX) 40 MG tablet Take 40 mg by mouth 2 (two) times daily.  07/21/19   [provider]  losartan (COZAAR) 50 MG tablet Take 1 tablet (50 mg  total) by mouth at bedtime. 12/28/19   Lendon Colonel, NP  metFORMIN (GLUCOPHAGE) 1000 MG tablet Take 500-1,000 mg by mouth daily with breakfast. Take 1000 mg in the morning and 500 mg in the evening 07/28/19   [provider]  nystatin cream (MYCOSTATIN) Apply 1 application topically daily as needed (yeast under breast).  05/10/19   [provider]    Allergies    Tylenol arthritis ext [acetaminophen]  Review of Systems   Review of Systems  Unable to perform ROS: Acuity of condition    Physical Exam Updated Vital Signs BP 110/71   Pulse (!) 116   Resp (!) 22   Ht 5\' 2"  (1.575 m)   Wt 128.8 kg   SpO2 93%   BMI 51.94 kg/m   Physical Exam Vitals and nursing note reviewed.  Constitutional:      General: She is in acute distress.     Appearance: She is well-developed. She is obese. She is ill-appearing and diaphoretic.  HENT:     Head: Normocephalic and atraumatic.  Eyes:     Conjunctiva/sclera: Conjunctivae normal.  Cardiovascular:     Rate and Rhythm: Regular rhythm. Tachycardia present.  Pulmonary:     Effort: Tachypnea, accessory muscle usage and respiratory distress present.     Breath sounds: Decreased breath sounds and wheezing present.  Abdominal:     General: There is no distension.  Skin:    General: Skin is warm.  Neurological:     Mental Status: She is alert and oriented to person, place, and time.     Cranial Nerves: No cranial nerve deficit.     ED Results / Procedures / Treatments   Labs (all labs ordered are listed, but only abnormal results are displayed) Labs Reviewed  COMPREHENSIVE METABOLIC PANEL - Abnormal; Notable for the following components:      Result Value   Chloride 92 (*)    Glucose, Bld 142 (*)    BUN 95 (*)    Creatinine, Ser 2.69 (*)    Total Bilirubin 1.7 (*)    GFR calc non Af Amer 17 (*)    GFR calc Af Amer 20 (*)    Anion gap 18 (*)    All other components within normal limits  CBC WITH  DIFFERENTIAL/PLATELET - Abnormal; Notable for the following components:   WBC 13.1 (*)    RBC 6.00 (*)    Hemoglobin 16.3 (*)  HCT 52.5 (*)    RDW 17.7 (*)    nRBC 2.8 (*)    Neutro Abs 10.3 (*)    Monocytes Absolute 1.4 (*)    Abs Immature Granulocytes 0.38 (*)    All other components within normal limits  BRAIN NATRIURETIC PEPTIDE - Abnormal; Notable for the following components:   B Natriuretic Peptide 605.4 (*)    All other components within normal limits  SARS CORONAVIRUS 2 BY RT PCR [facility] ORDER, [place] LAB)  URINALYSIS, ROUTINE W REFLEX MICROSCOPIC    EKG EKG Interpretation  Date/Time:  Tuesday May 10 2020 20:21:48 EDT Ventricular Rate:  128 PR Interval:    QRS Duration: 110 QT Interval:  320 QTC Calculation: 466 R Axis:   151 Text Interpretation: Atrial fibrillation Right axis deviation Anteroseptal infarct, old Abnormal ECG Confirmed by Carmin Muskrat [phone]) on 05/10/2020 9:44:58 PM   Radiology DG Chest Port 1 View  Result Date: 05/10/2020 CLINICAL DATA:  Shortness of breath and chest pain. EXAM: PORTABLE CHEST 1 VIEW COMPARISON:  Radiograph 4 days ago 05/06/2020, additional priors. FINDINGS: Cardiomegaly. Progressive right pleural effusion with increasing fluid in the minor fissure. Increasing right lung base opacity likely compressive atelectasis. Aortic atherosclerosis. Vascular congestion. No pneumothorax. IMPRESSION: 1. Progressive right pleural effusion with increasing fluid in the minor fissure over the past 4 days. 2. Cardiomegaly with vascular congestion. Electronically Signed   By: Keith Rake M.D.   On: 05/10/2020 20:42    Procedures Procedures (including critical care time)  CRITICAL CARE Performed by: Carmin Muskrat Total critical care time: 35 minutes Critical care time was exclusive of separately billable procedures and treating other patients. Critical care was necessary to treat or prevent imminent or  life-threatening deterioration. Critical care was time spent personally by me on the following activities: development of treatment plan with patient and/or surrogate as well as nursing, discussions with consultants, evaluation of patient's response to treatment, examination of patient, obtaining history from patient or surrogate, ordering and performing treatments and interventions, ordering and review of laboratory studies, ordering and review of radiographic studies, pulse oximetry and re-evaluation of patient's condition.   Medications Ordered in ED Medications  albuterol (VENTOLIN HFA) 108 (90 Base) MCG/ACT inhaler (  Not Given 05/10/20 2030)  albuterol (VENTOLIN HFA) 108 (90 Base) MCG/ACT inhaler 2 puff (2 puffs Inhalation Given 05/10/20 2020)  ipratropium-albuterol (DUONEB) 0.5-2.5 (3) MG/3ML nebulizer solution 3 mL (has no administration in time range)  furosemide (LASIX) injection 40 mg (40 mg Intravenous Given 05/10/20 2025)  methylPREDNISolone sodium succinate (SOLU-MEDROL) 125 mg/2 mL injection 125 mg (125 mg Intravenous Given 05/10/20 2025)    ED Course  I have reviewed the triage vital signs and the nursing notes.  Pertinent labs & imaging results that were available during my care of the patient were reviewed by me and considered in my medical decision making (see chart for details).  Consideration of respiratory distress on arrival the patient had continuous oxygen, monitoring applied soon after arrival. Patient had initiation of respiratory therapy with albuterol, Solu-Medrol, continuous supplement. X-ray performed, labs reviewed.  Update:, X-ray concerning for worsening right-sided pleural effusion.  Labs notable for acute kidney injury, elevated BNP. Patient has already received IV Lasix empirically.  10:57 PM Over negative. Patient's vital signs improved, though she remains tachycardic, tachypneic. She continues to require 4 L facemask for appropriate oxygenation.  This  obese elderly female presents with respiratory distress.  Line patient arrives on CPAP, continues to require supplemental  oxygen, and after arrival when she was briefly hypotensive, has substantial improvement, both cognitively and hemodynamically, though she remains tachycardic, tachypneic. Some suspicion for heart failure contributing to her symptoms given elevated BNP, pleural effusion, vascular congestion.  Line patient is afebrile, has negative Covid test, lower suspicion for atypical infection, pneumonia. Patient awake, alert, hemodynamically improved, but requires admission to the stepdown unit given presentation for respiratory distress.   Final Clinical Impression(s) / ED Diagnoses Final diagnoses:  Respiratory distress     Carmin Muskrat, MD 05/10/20 2259

## 2020-05-10 NOTE — ED Notes (Signed)
Pt having occasional desaturations to 83% with good pleth on monitor. Exchanged 4 lpm [place] for 10 lpm [facility] pt now O2 sats upper 90's

## 2020-05-10 NOTE — H&P (Signed)
History and Physical    Amy Riddle IOE:[id number] DOB: 1946-01-31 DOA: 05/10/2020  PCP: Leonard Downing, MD  Patient coming from: Home  I have personally briefly reviewed patient's old medical records in [place]  Chief Complaint: Worsening shortness of breath  HPI: Amy Riddle is a 74 y.o. female with medical history significant for Hx of intermittent asthma, HTN, permanent atrial fibrillation on Eliquis, Type 2 DM, and morbid obesity who presents with worsening shortness of breath.   Patient reports that she has been having ongoing shortness of breath that is worse this week.  Worse especially with exertion and better at rest.  Had to increase to sleeping on 2 pillows.  Has had wheezing, increased cough with sputum production.  No fever.  Also noted bilateral lower extremity edema.  She does not have a formal diagnosis of CHF.  She was evaluated by her PCP about 4 days ago and had increases to her Advair and increase of her Lasix from 40 mg to 60 mg.  However she did not feel like she had increased urine output from that.  Patient presented presented to ED on CPAP and was found to have O2 saturation in 70s.  Not able to wean down to a nonrebreather on 4 L.  Has leukocytosis of 13.1. Creatinine elevated to 2.69 from a prior of 0.70. Elevated bilirubin of 1.7. Anion gap of 18. BNP of 605.4.   CXR shows progressive right pleural effusion with increasing fluid and cardiomegaly with vascular congestion.  She was given Solu-Medrol, DuoNeb and 40 mg Lasix in the ED.  Review of Systems: Constitutional: No Weight Change, No Fever ENT/Mouth: No sore throat, No Rhinorrhea Eyes: No Eye Pain, No Vision Changes Cardiovascular: No Chest Pain, + SOB Respiratory: + Cough, +Sputum,+ Wheezing,+ Dyspnea  Gastrointestinal: No Nausea, No Vomiting, No Diarrhea, No Constipation, No Pain Genitourinary: no Urinary Incontinence Musculoskeletal: No Arthralgias, No Myalgias Skin:  No Skin Lesions, No Pruritus, Neuro: no Weakness, No Numbness Psych: No Anxiety/Panic, No Depression, no decrease appetite Heme/Lymph: No Bruising, No Bleeding   Past Medical History:  Diagnosis Date  . Arthritis   . Asthma   . Diabetes mellitus without complication (HCC)    TYPE II no meds  . Eczema   . Fibroid   . Hypertension   . Osteoarthritis   . Ovarian neoplasm    PRODUCING TESTOSTERONE  . Submucous myoma of uterus     Past Surgical History:  Procedure Laterality Date  . CARDIOVERSION N/A 10/05/2019   Procedure: CARDIOVERSION;  Surgeon: Fay Records, MD;  Location: [place];  Service: Cardiovascular;  Laterality: N/A;  . CARPAL TUNNEL RELEASE Left   . CARPAL TUNNEL RELEASE Left 03/23/2015   Procedure: LEFT CARPAL TUNNEL RELEASE;  Surgeon: Leandrew Koyanagi, MD;  Location: [place];  Service: Orthopedics;  Laterality: Left;  . DERMOID CYST  EXCISION    . HERNIA REPAIR     X2  . HYSTEROSCOPY     D & C  . PELVIC LAPAROSCOPY       reports that she quit smoking about 46 years ago. She has never used smokeless tobacco. She reports that she does not drink alcohol or use drugs.  Allergies  Allergen Reactions  . Tylenol Arthritis Ext [Acetaminophen] Other (See Comments)    Does not help    Family History  Problem Relation Age of Onset  . Heart disease Mother   . Cancer Father  PANCREATIC  . Hypertension Brother   . Breast cancer Maternal Aunt   . Hypertension Maternal Grandmother   . Heart disease Maternal Grandfather      Prior to Admission medications   Medication Sig Start Date End Date Taking? Authorizing Provider  acetaminophen (TYLENOL) 500 MG tablet Take 1,000 mg by mouth every 8 (eight) hours as needed for mild pain or moderate pain.    [provider]  ADVAIR DISKUS 250-50 MCG/DOSE AEPB Inhale 1 puff into the lungs 2 (two) times daily. 07/28/19   [provider]  albuterol (VENTOLIN HFA) 108 (90 Base) MCG/ACT  inhaler Inhale 1-2 puffs into the lungs 2 (two) times daily as needed for shortness of breath.  07/24/19   [provider]  Clobetasol Prop Emollient Base 0.05 % emollient cream Apply 1 application topically daily as needed (eczema).     [provider]  diclofenac sodium (VOLTAREN) 1 % GEL Apply 2 g topically 2 (two) times daily.    [provider]  diltiazem (CARDIZEM CD) 360 MG 24 hr capsule Take 1 capsule (360 mg total) by mouth daily. 01/27/20   Lendon Colonel, NP  ELIQUIS 5 MG TABS tablet Take 1 tablet (5 mg total) by mouth 2 (two) times daily. 03/09/20   Minus Breeding, MD  furosemide (LASIX) 40 MG tablet Take 40 mg by mouth 2 (two) times daily.  07/21/19   [provider]  losartan (COZAAR) 50 MG tablet Take 1 tablet (50 mg total) by mouth at bedtime. 12/28/19   Lendon Colonel, NP  metFORMIN (GLUCOPHAGE) 1000 MG tablet Take 500-1,000 mg by mouth daily with breakfast. Take 1000 mg in the morning and 500 mg in the evening 07/28/19   [provider]  nystatin cream (MYCOSTATIN) Apply 1 application topically daily as needed (yeast under breast).  05/10/19   [provider]    Physical Exam: Vitals:   05/10/20 2145 05/10/20 2200 05/10/20 2215 05/10/20 2230  BP: (!) 92/50 94/66 93/73  110/71  Pulse: (!) 111 (!) 101 (!) 129 (!) 116  Resp: (!) 26 (!) 29 (!) 22 (!) 22  SpO2: 96% 95% 90% 93%  Weight:      Height:        Constitutional: NAD, calm, comfortable nontoxic appearing female sitting upright in bed on nonrebreather on 4 L Vitals:   05/10/20 2145 05/10/20 2200 05/10/20 2215 05/10/20 2230  BP: (!) 92/50 94/66 93/73  110/71  Pulse: (!) 111 (!) 101 (!) 129 (!) 116  Resp: (!) 26 (!) 29 (!) 22 (!) 22  SpO2: 96% 95% 90% 93%  Weight:      Height:       Eyes: PERRL, lids and conjunctivae normal ENMT: Mucous membranes are moist. Neck: normal, supple Respiratory: Decreased aeration throughout with faint diffuse crackles with no  wheezing.  Normal respiratory effort on nonrebreather. No accessory muscle use.  Cardiovascular: Regular rate and rhythm, no murmurs / rubs / gallops.  +3 pitting bilateral edema up to pretibial region.   Abdomen: no tenderness, no masses palpated.Bowel sounds positive.  Musculoskeletal: no clubbing / cyanosis. No joint deformity upper and lower extremities. Good ROM, no contractures. Normal muscle tone.  Skin: no rashes, lesions, ulcers. No induration Neurologic: CN 2-12 grossly intact. Sensation intact. Strength 5/5 in all 4.  Psychiatric: Normal judgment and insight. Alert and oriented x 3. Normal mood.     Labs on Admission: I have personally reviewed following labs and imaging studies  CBC: Recent Labs  Lab 05/10/20 2010  WBC 13.1*  NEUTROABS 10.3*  HGB 16.3*  HCT 52.5*  MCV 87.5  PLT 825   Basic Metabolic Panel: Recent Labs  Lab 05/10/20 2010  NA 137  K 4.7  CL 92*  CO2 27  GLUCOSE 142*  BUN 95*  CREATININE 2.69*  CALCIUM 9.2   GFR: Estimated Creatinine Clearance: 24.7 mL/min (A) (by C-G formula based on SCr of 2.69 mg/dL (H)). Liver Function Tests: Recent Labs  Lab 05/10/20 2010  AST 22  ALT 36  ALKPHOS 49  BILITOT 1.7*  PROT 6.5  ALBUMIN 3.7   No results for input(s): LIPASE, AMYLASE in the last 168 hours. No results for input(s): AMMONIA in the last 168 hours. Coagulation Profile: No results for input(s): INR, PROTIME in the last 168 hours. Cardiac Enzymes: No results for input(s): CKTOTAL, CKMB, CKMBINDEX, TROPONINI in the last 168 hours. BNP (last 3 results) No results for input(s): PROBNP in the last 8760 hours. HbA1C: No results for input(s): HGBA1C in the last 72 hours. CBG: No results for input(s): GLUCAP in the last 168 hours. Lipid Profile: No results for input(s): CHOL, HDL, LDLCALC, TRIG, CHOLHDL, LDLDIRECT in the last 72 hours. Thyroid Function Tests: No results for input(s): TSH, T4TOTAL, FREET4, T3FREE, THYROIDAB in the last 72  hours. Anemia Panel: No results for input(s): VITAMINB12, FOLATE, FERRITIN, TIBC, IRON, RETICCTPCT in the last 72 hours. Urine analysis: No results found for: COLORURINE, APPEARANCEUR, LABSPEC, [place], GLUCOSEU, HGBUR, BILIRUBINUR, KETONESUR, PROTEINUR, UROBILINOGEN, NITRITE, LEUKOCYTESUR  Radiological Exams on Admission: DG Chest Port 1 View  Result Date: 05/10/2020 CLINICAL DATA:  Shortness of breath and chest pain. EXAM: PORTABLE CHEST 1 VIEW COMPARISON:  Radiograph 4 days ago 05/06/2020, additional priors. FINDINGS: Cardiomegaly. Progressive right pleural effusion with increasing fluid in the minor fissure. Increasing right lung base opacity likely compressive atelectasis. Aortic atherosclerosis. Vascular congestion. No pneumothorax. IMPRESSION: 1. Progressive right pleural effusion with increasing fluid in the minor fissure over the past 4 days. 2. Cardiomegaly with vascular congestion. Electronically Signed   By: Keith Rake M.D.   On: 05/10/2020 20:42      Assessment/Plan  Acute hypoxic respiratory failure from new onset CHF and possible asthma exacerbation Last Echo is 45-50% in 08/2019.  Will repeat. Strict I's and O's.  Daily weights Daily IV 40 mg Lasix twice daily PRN Xopenex  Hypotension Somewhat improved without intervention.  We will hold antihypertensive  AKI Likely from vascular congestion. We will need to monitor closely while on IV diuresis  Permanent atrial fibrillation Nonsustained RVR worse with exertion Continue diltiazem but will hold losartan due to AKI and some hypotension Continue Eliquis  Non-insulin-dependent type 2 diabetes Low-dose sliding scale  DVT prophylaxis: Eliquis  code Status: DNR-confirmed with patient Family Communication: Plan discussed with patient at bedside  disposition Plan: Home with at least 2 midnight stays  Consults called:  Admission status: inpatient  Status is: Inpatient  Remains inpatient appropriate  because:Inpatient level of care appropriate due to severity of illness   Dispo: The patient is from: Home              Anticipated d/c is to: Home              Anticipated d/c date is: > 3 days              Patient currently is not medically stable to d/c.         Orene Desanctis DO [facility]   If  7PM-7AM, please contact night-coverage [email or web address]   05/10/2020, 11:38 PM

## 2020-05-10 NOTE — ED Triage Notes (Signed)
Pt noted to have pitting edema to bilateral lower extremities. Cyanosis to bilateral hands with 5 sec cap refill. Reports 10 lb weight gain over the last week.

## 2020-05-11 ENCOUNTER — Inpatient Hospital Stay (HOSPITAL_COMMUNITY): Payer: Medicare Other

## 2020-05-11 ENCOUNTER — Encounter (HOSPITAL_COMMUNITY): Payer: Self-pay | Admitting: Family Medicine

## 2020-05-11 ENCOUNTER — Other Ambulatory Visit (HOSPITAL_COMMUNITY): Payer: 59

## 2020-05-11 DIAGNOSIS — I5021 Acute systolic (congestive) heart failure: Secondary | ICD-10-CM

## 2020-05-11 DIAGNOSIS — J9602 Acute respiratory failure with hypercapnia: Secondary | ICD-10-CM

## 2020-05-11 DIAGNOSIS — J45901 Unspecified asthma with (acute) exacerbation: Secondary | ICD-10-CM

## 2020-05-11 LAB — BASIC METABOLIC PANEL
Anion gap: 14 (ref 5–15)
BUN: 93 mg/dL — ABNORMAL HIGH (ref 8–23)
CO2: 29 mmol/L (ref 22–32)
Calcium: 9 mg/dL (ref 8.9–10.3)
Chloride: 93 mmol/L — ABNORMAL LOW (ref 98–111)
Creatinine, Ser: 2.56 mg/dL — ABNORMAL HIGH (ref 0.44–1.00)
GFR calc Af Amer: 21 mL/min — ABNORMAL LOW (ref >60)
GFR calc non Af Amer: 18 mL/min — ABNORMAL LOW (ref >60)
Glucose, Bld: 175 mg/dL — ABNORMAL HIGH (ref 70–99)
Potassium: 4.5 mmol/L (ref 3.5–5.1)
Sodium: 136 mmol/L (ref 135–145)

## 2020-05-11 LAB — URINALYSIS, ROUTINE W REFLEX MICROSCOPIC
Bilirubin Urine: NEGATIVE
Glucose, UA: NEGATIVE mg/dL
Ketones, ur: NEGATIVE mg/dL
Nitrite: NEGATIVE
Protein, ur: 30 mg/dL — AB
Specific Gravity, Urine: 1.012 (ref 1.005–1.030)
pH: 5 (ref 5.0–8.0)

## 2020-05-11 LAB — HEMOGLOBIN A1C
Hgb A1c MFr Bld: 6.7 % — ABNORMAL HIGH (ref 4.8–5.6)
Mean Plasma Glucose: 145.59 mg/dL

## 2020-05-11 LAB — GLUCOSE, CAPILLARY
Glucose-Capillary: 179 mg/dL — ABNORMAL HIGH (ref 70–99)
Glucose-Capillary: 202 mg/dL — ABNORMAL HIGH (ref 70–99)

## 2020-05-11 LAB — ECHOCARDIOGRAM COMPLETE
Height: 62 in
Weight: 4544 oz

## 2020-05-11 LAB — CBG MONITORING, ED: Glucose-Capillary: 160 mg/dL — ABNORMAL HIGH (ref 70–99)

## 2020-05-11 LAB — MAGNESIUM: Magnesium: 2.1 mg/dL (ref 1.7–2.4)

## 2020-05-11 MED ORDER — SODIUM CHLORIDE 0.9 % IV SOLN
500.0000 mg | INTRAVENOUS | Status: AC
  Administered 2020-05-11 – 2020-05-13 (×3): 500 mg via INTRAVENOUS
  Filled 2020-05-11 (×3): qty 500

## 2020-05-11 MED ORDER — CHLORHEXIDINE GLUCONATE 0.12 % MT SOLN
15.0000 mL | Freq: Two times a day (BID) | OROMUCOSAL | Status: DC
  Administered 2020-05-12 – 2020-05-20 (×13): 15 mL via OROMUCOSAL
  Filled 2020-05-11 (×15): qty 15

## 2020-05-11 MED ORDER — ARFORMOTEROL TARTRATE 15 MCG/2ML IN NEBU
15.0000 ug | INHALATION_SOLUTION | Freq: Two times a day (BID) | RESPIRATORY_TRACT | Status: DC
  Administered 2020-05-11 – 2020-05-16 (×10): 15 ug via RESPIRATORY_TRACT
  Filled 2020-05-11 (×12): qty 2

## 2020-05-11 MED ORDER — METHYLPREDNISOLONE SODIUM SUCC 125 MG IJ SOLR
60.0000 mg | Freq: Two times a day (BID) | INTRAMUSCULAR | Status: DC
  Administered 2020-05-11 – 2020-05-12 (×2): 60 mg via INTRAVENOUS
  Filled 2020-05-11 (×2): qty 2

## 2020-05-11 MED ORDER — DILTIAZEM HCL ER COATED BEADS 180 MG PO CP24
360.0000 mg | ORAL_CAPSULE | Freq: Every day | ORAL | Status: DC
  Administered 2020-05-11 – 2020-05-20 (×10): 360 mg via ORAL
  Filled 2020-05-11 (×7): qty 2
  Filled 2020-05-11: qty 1
  Filled 2020-05-11 (×2): qty 2

## 2020-05-11 MED ORDER — BUDESONIDE 0.5 MG/2ML IN SUSP
0.5000 mg | Freq: Two times a day (BID) | RESPIRATORY_TRACT | Status: DC
  Administered 2020-05-11 – 2020-05-16 (×10): 0.5 mg via RESPIRATORY_TRACT
  Filled 2020-05-11 (×12): qty 2

## 2020-05-11 MED ORDER — INSULIN ASPART 100 UNIT/ML ~~LOC~~ SOLN
0.0000 [IU] | Freq: Three times a day (TID) | SUBCUTANEOUS | Status: DC
  Administered 2020-05-11 – 2020-05-12 (×4): 2 [IU] via SUBCUTANEOUS

## 2020-05-11 MED ORDER — SODIUM CHLORIDE 0.9 % IV SOLN
1.0000 g | INTRAVENOUS | Status: DC
  Administered 2020-05-11 – 2020-05-17 (×7): 1 g via INTRAVENOUS
  Filled 2020-05-11 (×7): qty 10

## 2020-05-11 MED ORDER — APIXABAN 5 MG PO TABS
5.0000 mg | ORAL_TABLET | Freq: Two times a day (BID) | ORAL | Status: DC
  Administered 2020-05-11 – 2020-05-20 (×19): 5 mg via ORAL
  Filled 2020-05-11 (×21): qty 1

## 2020-05-11 MED ORDER — MOMETASONE FURO-FORMOTEROL FUM 200-5 MCG/ACT IN AERO
2.0000 | INHALATION_SPRAY | Freq: Two times a day (BID) | RESPIRATORY_TRACT | Status: DC
  Administered 2020-05-11: 2 via RESPIRATORY_TRACT
  Filled 2020-05-11: qty 8.8

## 2020-05-11 MED ORDER — ORAL CARE MOUTH RINSE
15.0000 mL | Freq: Two times a day (BID) | OROMUCOSAL | Status: DC
  Administered 2020-05-12 – 2020-05-18 (×10): 15 mL via OROMUCOSAL

## 2020-05-11 MED ORDER — CHLORHEXIDINE GLUCONATE CLOTH 2 % EX PADS
6.0000 | MEDICATED_PAD | Freq: Every day | CUTANEOUS | Status: DC
  Administered 2020-05-11 – 2020-05-19 (×8): 6 via TOPICAL

## 2020-05-11 NOTE — [place] Notes (Signed)
Pt was put on bipap, not too long, pt took it OFF. Pt stated  "I am done on it" pt was put back on [place]. Will  Try again later as pt approves.

## 2020-05-11 NOTE — [place] Notes (Signed)
Arterial stick attempted x3 by two different RT's. The sample obtained was venous. RT ran the sample to determine CO2 level. RN was notified about venous sample and with critical results. RN paging MD.

## 2020-05-11 NOTE — ED Notes (Signed)
Pt ate breakfast on 4L [place] and had sat's around 87%. After eating pt placed back on NRB at 98%. Pt helped into a recliner for position of comfort of sitting up at 90 degree angle.  Pt is resting comfortably at this time, still awaiting admission bed.

## 2020-05-11 NOTE — ED Notes (Addendum)
Paged admitting MD due to pt being mildly agitated taking BiPAP off and attempting to get out of bed. Admitting paged CCM to see pt due to CO2 greater than 97 and pts mentation.

## 2020-05-11 NOTE — [place] Notes (Signed)
PROGRESS NOTE    Amy Riddle  POE:[id number] DOB: [birth year] DOA: 05/10/2020 PCP: Leonard Downing, MD    Brief Narrative:  Amy Riddle is a 74 y.o. female with medical history significant for Hx of intermittent asthma, HTN, permanent atrial fibrillation on Eliquis, Type 2 DM, and morbid obesity who presents with worsening shortness of breath.   Patient reports that she has been having ongoing shortness of breath that is worse this week.  Worse especially with exertion and better at rest.  Had to increase to sleeping on 2 pillows.  Has had wheezing, increased cough with sputum production.  No fever.  Also noted bilateral lower extremity edema.  She does not have a formal diagnosis of CHF.  She was evaluated by her PCP about 4 days ago and had increases to her Advair and increase of her Lasix from 40 mg to 60 mg.  However she did not feel like she had increased urine output from that.  Patient presented presented to ED on CPAP and was found to have O2 saturation in 70s.  Not able to wean down to a nonrebreather on 4 L.  Has leukocytosis of 13.1. Creatinine elevated to 2.69 from a prior of 0.70. Elevated bilirubin of 1.7. Anion gap of 18. BNP of 605.4.   CXR shows progressive right pleural effusion with increasing fluid and cardiomegaly with vascular congestion.  She was given Solu-Medrol, DuoNeb and 40 mg Lasix in the ED.   Consultants:   pccm  Procedures:  Antimicrobials:      Subjective: Patient seen and examined this AM.  Currently in the ED.  Amy Riddle on fm, keeps taking it off  Objective: Vitals:   05/11/20 1502 05/11/20 1537 05/11/20 1551 05/11/20 1600  BP: 93/78  (!) 126/95   Pulse: (!) 124 (!) 108 86   Resp: (!) 21 (!) 22 16 (!) 23  SpO2: (!) 86% 93% 100%   Weight:      Height:        Intake/Output Summary (Last 24 hours) at 05/11/2020 1644 Last data filed at 05/11/2020 1543 Gross per 24 hour  Intake 100 ml  Output 1000 ml  Net -900 ml    Filed Weights   05/10/20 2012  Weight: 128.8 kg    Examination:  General exam: Appears calm and comfortable , on fm , sitting in chair Respiratory system: scattered wheezing b/l, no rhonchi.  Decrease bs at bases Cardiovascular system: S1 & S2 heard, RRR. No JVD, murmurs, rubs, gallops or clicks.  Gastrointestinal system: Abdomen is nondistended, soft and nontender. Normal bowel sounds heard. Central nervous system: Alert and awake, grossly intact Extremities: mild edema b/lr. Skin: Warm dry Psychiatry: Mood & affect appropriate in current setting.     Data Reviewed: I have personally reviewed following labs and imaging studies  CBC: Recent Labs  Lab 05/10/20 2010  WBC 13.1*  NEUTROABS 10.3*  HGB 16.3*  HCT 52.5*  MCV 87.5  PLT 315   Basic Metabolic Panel: Recent Labs  Lab 05/10/20 2010 05/10/20 2045 05/11/20 0212  NA 137  --  136  K 4.7  --  4.5  CL 92*  --  93*  CO2 27  --  29  GLUCOSE 142*  --  175*  BUN 95*  --  93*  CREATININE 2.69*  --  2.56*  CALCIUM 9.2  --  9.0  MG  --  2.1  --    GFR: Estimated Creatinine Clearance: 26 mL/min (A) (by C-G  formula based on SCr of 2.56 mg/dL (H)). Liver Function Tests: Recent Labs  Lab 05/10/20 2010  AST 22  ALT 36  ALKPHOS 49  BILITOT 1.7*  PROT 6.5  ALBUMIN 3.7   No results for input(s): LIPASE, AMYLASE in the last 168 hours. No results for input(s): AMMONIA in the last 168 hours. Coagulation Profile: No results for input(s): INR, PROTIME in the last 168 hours. Cardiac Enzymes: No results for input(s): CKTOTAL, CKMB, CKMBINDEX, TROPONINI in the last 168 hours. BNP (last 3 results) No results for input(s): PROBNP in the last 8760 hours. HbA1C: Recent Labs    05/11/20 0212  HGBA1C 6.7*   CBG: Recent Labs  Lab 05/11/20 0739  GLUCAP 160*   Lipid Profile: No results for input(s): CHOL, HDL, LDLCALC, TRIG, CHOLHDL, LDLDIRECT in the last 72 hours. Thyroid Function Tests: No results for input(s):  TSH, T4TOTAL, FREET4, T3FREE, THYROIDAB in the last 72 hours. Anemia Panel: No results for input(s): VITAMINB12, FOLATE, FERRITIN, TIBC, IRON, RETICCTPCT in the last 72 hours. Sepsis Labs: No results for input(s): PROCALCITON, LATICACIDVEN in the last 168 hours.  Recent Results (from the past 240 hour(s))  SARS Coronavirus 2 by RT PCR (hospital order, performed in [facility] hospital lab) Nasopharyngeal Nasopharyngeal Swab     Status: None   Collection Time: 05/10/20  8:31 PM   Specimen: Nasopharyngeal Swab  Result Value Ref Range Status   SARS Coronavirus 2 NEGATIVE NEGATIVE Final    Comment: (NOTE) SARS-CoV-2 target nucleic acids are NOT DETECTED. The SARS-CoV-2 RNA is generally detectable in upper and lower respiratory specimens during the acute phase of infection. The lowest concentration of SARS-CoV-2 viral copies this assay can detect is 250 copies / mL. A negative result does not preclude SARS-CoV-2 infection and should not be used as the sole basis for treatment or other patient management decisions.  A negative result may occur with improper specimen collection / handling, submission of specimen other than nasopharyngeal swab, presence of viral mutation(s) within the areas targeted by this assay, and inadequate number of viral copies (<250 copies / mL). A negative result must be combined with clinical observations, patient history, and epidemiological information. Fact Sheet for Patients:   StrictlyIdeas.no Fact Sheet for Healthcare Providers: BankingDealers.co.za This test is not yet approved or cleared  by the [place] FDA and has been authorized for detection and/or diagnosis of SARS-CoV-2 by FDA under an Emergency Use Authorization (EUA).  This EUA will remain in effect (meaning this test can be used) for the duration of the COVID-19 declaration under Section 564(b)(1) of the Act, 21 U.S.C. section 360bbb-3(b)(1),  unless the authorization is terminated or revoked sooner. Performed at [facility], [place] [address], [place], [place] [address]          Radiology Studies: US RENAL  Result Date: 05/11/2020 CLINICAL DATA:  Acute renal insufficiency. EXAM: RENAL / URINARY TRACT ULTRASOUND COMPLETE COMPARISON:  None. FINDINGS: Right Kidney: Renal measurements: 11.2 cm x 6.3 cm x 6.6 cm = volume: 242.5 mL . Echogenicity within normal limits. No mass or hydronephrosis visualized. Left Kidney: Renal measurements: 11.9 cm x 6.2 cm x 6.4 cm = volume: 246.6 mL. Echogenicity within normal limits. No mass or hydronephrosis visualized. A small amount of perinephric fluid is seen on the left. Bladder: The urinary bladder is not clearly visualized. Other: None. IMPRESSION: Small amount of left-sided perinephric fluid. Electronically Signed   By: Virgina Norfolk M.D.   On: 05/11/2020 16:26   DG  Chest Port 1 View  Result Date: 05/10/2020 CLINICAL DATA:  Shortness of breath and chest pain. EXAM: PORTABLE CHEST 1 VIEW COMPARISON:  Radiograph 4 days ago 05/06/2020, additional priors. FINDINGS: Cardiomegaly. Progressive right pleural effusion with increasing fluid in the minor fissure. Increasing right lung base opacity likely compressive atelectasis. Aortic atherosclerosis. Vascular congestion. No pneumothorax. IMPRESSION: 1. Progressive right pleural effusion with increasing fluid in the minor fissure over the past 4 days. 2. Cardiomegaly with vascular congestion. Electronically Signed   By: Keith Rake M.D.   On: 05/10/2020 20:42        Scheduled Meds:  apixaban  5 mg Oral BID   arformoterol  15 mcg Nebulization BID   budesonide (PULMICORT) nebulizer solution  0.5 mg Nebulization BID   diltiazem  360 mg Oral Daily   insulin aspart  0-9 Units Subcutaneous TID WC   methylPREDNISolone (SOLU-MEDROL) injection  60 mg Intravenous Q12H   sodium chloride flush  3 mL Intravenous Q12H   Continuous  Infusions:  sodium chloride     azithromycin 500 mg (05/11/20 1439)   cefTRIAXone (ROCEPHIN)  IV Stopped (05/11/20 1543)    Assessment & Plan:   Principal Problem:   Acute CHF (congestive heart failure) (HCC) Active Problems:   Diabetes (HCC)   Hypotension   AKI (acute kidney injury) ([place])   Permanent atrial fibrillation ([place])   Acute hypoxemic and hypercapnic respiratory failure -multifactoria. CHF and possible asthma exacerbation Last Echo is 45-50% in 08/2019.  Will repeat. Strict I's and O's.  Daily weights Daily IV 40 mg Lasix twice daily PRN Xopenex Echo pending Pulmonary consulted, input was appreciated-held Lasix.  Started on IV steroids -wean O2 for sats 90-95% -brovana + pulmicort BID -PRN xopenex  IV antibiotics empirically Rt pleural effusion may need ?thoracentesis . Per pccm, only ck ABG if Amy Riddle with ms change.  Hypotension Somewhat improved without intervention.  We will hold antihypertensive  AKI Likely from vascular congestion. Received lasix iv. Monitor closely, if no improvement will consult nephrology renal US: Small amount of left-sided perinephric fluid.   Permanent atrial fibrillation Nonsustained RVR worse with exertion Continue diltiazem but will hold losartan due to AKI and some hypotension Continue Eliquis  Non-insulin-dependent type 2 diabetes Low-dose sliding scale   DVT prophylaxis: Eliquis Code Status: DNR Family Communication: None at bedside Disposition Plan: Back to Home Inpatient status Remains inpatient appropriate because:Inpatient level of care appropriate due to severity of illness  Dispo: The patient is from: Home  Anticipated d/c is to: Home  Anticipated d/c date is: > 3 days  Patient currently is not medically stable to d/c.    LOS: 1 day   Time spent: 45 min with more than >50% on coc    Nolberto Hanlon, MD [facility] Pager [phone]  If 7PM-7AM, please  contact night-coverage [email or web address] Password [facility] 05/11/2020, 4:44 PM

## 2020-05-11 NOTE — ED Notes (Addendum)
After getting up to use bedside commode pt became very short of breath o2 sat's seems very unreliable, have changed sensor to both ears and different finger probes. Goes from reading 80's to back to high 90's. Pt does seem somewhat more confused at times pt is alert and oriented to person and place but confused about situation and time. MD texted and verbal order for ABG. RT at bedside as well as husband.  Pt refuses to wear purewick.

## 2020-05-11 NOTE — ED Notes (Signed)
Ordered a hospital bed--Amy Riddle  

## 2020-05-11 NOTE — Consult Note (Signed)
NAME:  Amy Riddle, MRN:  [id number], DOB:  February 18, 1946, LOS: 1 ADMISSION DATE:  05/10/2020, CONSULTATION DATE: 05/11/20  REFERRING MD:  Dr. Kurtis Bushman / [facility] , CHIEF COMPLAINT:  SOB  Brief History   74 y/o F admitted 6/8 with reports of 2 weeks of increasing shortness of breath.    History of present illness   74 y/o F who presented 6/8 via EMS with reports of 2 weeks increasing shortness of breath.    Found to have room air saturations of 72-74% on EMS arrival.  They treated the patient with CPAP with improvement in symptoms.  She reported increased lower extremity swelling with 10 lb weight gain. She also noted increased HR with AF over the past weeks.  She was seen by her PCP 4 days prior to presentation and her lasix was increased from 40-60 mg and her Advair was also increased.    The patient was initially hypotensive in the ER with BP 63/37 and tachycardic to 160's but this resolved.  Initial work up notable for BUN 95 / sr cr 2.69, AG 18, BNP 605, WBC 13.1, Hgb 16.3, platelets 344, Hgb A1c 6.7, COVID testing negative. UA with moderate Hgb, negative ketones, 30 protein, rare bacteria, 6-10 WBC. Initial CXR showed progressive right pleural effusion, cardiomegaly and vascular congestion.  There is report of a venous blood gas with a ph of 7.1 / pCO2 90 but it is not listed in the computer.  She was afebrile.    PCCM consulted for evaluation.   Past Medical History  A-Fib - on eliquis, cardizem.  Has required cardioversion   HTN - on losartan, lasix.  Prior LVEF 40-45% Asthma - on advair  DM II - on metformin  Morbid Obesity  Former Smoker - quit [facility]   6/08 Admit  6/09 PCCM consulted   Consults:    Procedures:    Significant Diagnostic Tests:  Limited ECHO 6/9 >>  Renal US 6/9 >>   Micro Data:  COVID 6/8 >> negative   Antimicrobials:  Rocephin 6/9 >>  Azithro 6/9 >>   Interim history/subjective:  As above   Objective   Blood pressure  121/60, pulse (!) 118, resp. rate (!) 24, height 5\' 2"  (1.575 m), weight 128.8 kg, SpO2 95 %.    FiO2 (%):  [50 %] 50 %   Intake/Output Summary (Last 24 hours) at 05/11/2020 1404 Last data filed at 05/11/2020 1101 Gross per 24 hour  Intake --  Output 1000 ml  Net -1000 ml   Filed Weights   05/10/20 2012  Weight: 128.8 kg    Examination: General: elderly adult female sitting up in bed in NAD HEENT: MM pink/dry, bipap mask in place Neuro: Awakens, alert, drowsy but able to state name, place, month. Able to give history.  MAE.  CV: s1s2 RRR, no m/r/g PULM: non-labored on BiPAP, able to pull 300 ml to 1.8L, soft wheezing bilaterally GI: soft, bsx4 active  Extremities: warm/dry, trace LE edema  Skin: no rashes or lesions  Resolved Hospital Problem list      Assessment & Plan:   Acute Hypoxic / Hypercarbic (reported venous pCO2 90) Respiratory Failure  Suspected Asthma Exacerbation  Right Pleural Effusion  -BiPAP QHS & PRN daytime sleep  -follow intermittent CXR  -hold further lasix with renal injury  -wean O2 for sats 90-95% -brovana + pulmicort BID -PRN xopenex  -solumedrol 60 mg IV Q12  AKI  Suspect recent increase in medications,  volume depletion, hypotension contributing  -Trend BMP / urinary output -Replace electrolytes as indicated -Avoid nephrotoxic agents, ensure adequate renal perfusion  Possible UTI  -empiric abx as above -pan culture   AF  HTN Rule Out Acute CHF  On eliquis, cardizem at baseline  -assess limited ECHO  -hold home lasix  -continue eliquis, cardizem  -hold home losartan  -follow BP trend closely   DM II  -per primary    Best practice:  Diet: NPO while on bipap  Pain/Anxiety/Delirium protocol (if indicated): n/a  VAP protocol (if indicated): n/a  DVT prophylaxis: eliquis GI prophylaxis: n/a  Glucose control: per primary  Mobility: as tolerated  Code Status: DNR  Family Communication: Patient updated on plan of care  6/9 Disposition: Progressive  Labs   CBC: Recent Labs  Lab 05/10/20 2010  WBC 13.1*  NEUTROABS 10.3*  HGB 16.3*  HCT 52.5*  MCV 87.5  PLT 147    Basic Metabolic Panel: Recent Labs  Lab 05/10/20 2010 05/10/20 2045 05/11/20 0212  NA 137  --  136  K 4.7  --  4.5  CL 92*  --  93*  CO2 27  --  29  GLUCOSE 142*  --  175*  BUN 95*  --  93*  CREATININE 2.69*  --  2.56*  CALCIUM 9.2  --  9.0  MG  --  2.1  --    GFR: Estimated Creatinine Clearance: 26 mL/min (A) (by C-G formula based on SCr of 2.56 mg/dL (H)). Recent Labs  Lab 05/10/20 2010  WBC 13.1*    Liver Function Tests: Recent Labs  Lab 05/10/20 2010  AST 22  ALT 36  ALKPHOS 49  BILITOT 1.7*  PROT 6.5  ALBUMIN 3.7   No results for input(s): LIPASE, AMYLASE in the last 168 hours. No results for input(s): AMMONIA in the last 168 hours.  ABG No results found for: PHART, PCO2ART, PO2ART, HCO3, TCO2, ACIDBASEDEF, O2SAT   Coagulation Profile: No results for input(s): INR, PROTIME in the last 168 hours.  Cardiac Enzymes: No results for input(s): CKTOTAL, CKMB, CKMBINDEX, TROPONINI in the last 168 hours.  HbA1C: Hgb A1c MFr Bld  Date/Time Value Ref Range Status  05/11/2020 02:12 AM 6.7 (H) 4.8 - 5.6 % Final    Comment:    (NOTE) Pre diabetes:          5.7%-6.4% Diabetes:              >6.4% Glycemic control for   <7.0% adults with diabetes     CBG: Recent Labs  Lab 05/11/20 0739  GLUCAP 160*    Review of Systems: Positives in [place]   Gen: Denies fever, chills, weight change, fatigue, night sweats HEENT: Denies blurred vision, double vision, hearing loss, tinnitus, sinus congestion, rhinorrhea, sore throat, neck stiffness, dysphagia PULM: Denies shortness of breath, cough, sputum production, hemoptysis, wheezing CV: Denies chest pain, edema, orthopnea, paroxysmal nocturnal dyspnea, palpitations GI: Denies abdominal pain, nausea, vomiting, diarrhea, hematochezia, melena, constipation, change in  bowel habits GU: Denies dysuria, hematuria, polyuria, oliguria, urethral discharge Endocrine: Denies hot or cold intolerance, polyuria, polyphagia or appetite change Derm: Denies rash, dry skin, scaling or peeling skin change Heme: Denies easy bruising, bleeding, bleeding gums Neuro: Denies headache, numbness, weakness, slurred speech, loss of memory or consciousness  Past Medical History  She,  has a past medical history of Arthritis, Asthma, Diabetes mellitus without complication ([place]), Eczema, Fibroid, Hypertension, Osteoarthritis, Ovarian neoplasm, and Submucous myoma of uterus.   Surgical  History    Past Surgical History:  Procedure Laterality Date  . CARDIOVERSION N/A 10/05/2019   Procedure: CARDIOVERSION;  Surgeon: Fay Records, MD;  Location: [place];  Service: Cardiovascular;  Laterality: N/A;  . CARPAL TUNNEL RELEASE Left   . CARPAL TUNNEL RELEASE Left 03/23/2015   Procedure: LEFT CARPAL TUNNEL RELEASE;  Surgeon: Leandrew Koyanagi, MD;  Location: [place];  Service: Orthopedics;  Laterality: Left;  . DERMOID CYST  EXCISION    . HERNIA REPAIR     X2  . HYSTEROSCOPY     D & C  . PELVIC LAPAROSCOPY       Social History   reports that she quit smoking about 46 years ago. She has never used smokeless tobacco. She reports that she does not drink alcohol or use drugs.   Family History   Her family history includes Breast cancer in her maternal aunt; Cancer in her father; Heart disease in her maternal grandfather and mother; Hypertension in her brother and maternal grandmother.   Allergies Allergies  Allergen Reactions  . Tylenol Arthritis Ext [Acetaminophen] Other (See Comments)    Does not help     Home Medications  Prior to Admission medications   Medication Sig Start Date End Date Taking? Authorizing Provider  acetaminophen (TYLENOL) 500 MG tablet Take 1,000 mg by mouth every 8 (eight) hours as needed for mild pain or moderate pain.   Yes [provider]  ADVAIR DISKUS 500-50 MCG/DOSE AEPB Inhale 1 puff into the lungs 2 (two) times daily. 05/05/20  Yes [provider]  albuterol (VENTOLIN HFA) 108 (90 Base) MCG/ACT inhaler Inhale 1-2 puffs into the lungs 2 (two) times daily as needed for shortness of breath.  07/24/19  Yes [provider]  azithromycin (ZITHROMAX) 250 MG tablet Take 250 mg by mouth as directed. 05/03/20  Yes [provider]  Clobetasol Prop Emollient Base 0.05 % emollient cream Apply 1 application topically daily as needed (eczema).    Yes [provider]  diclofenac sodium (VOLTAREN) 1 % GEL Apply 2 g topically 2 (two) times daily.   Yes [provider]  diltiazem (CARDIZEM CD) 360 MG 24 hr capsule Take 1 capsule (360 mg total) by mouth daily. 01/27/20  Yes Lendon Colonel, NP  ELIQUIS 5 MG TABS tablet Take 1 tablet (5 mg total) by mouth 2 (two) times daily. 03/09/20  Yes Minus Breeding, MD  furosemide (LASIX) 40 MG tablet Take 40 mg by mouth 2 (two) times daily.  07/21/19  Yes [provider]  loratadine (CLARITIN) 10 MG tablet Take 10 mg by mouth daily as needed for allergies. 05/04/20  Yes [provider]  losartan (COZAAR) 50 MG tablet Take 1 tablet (50 mg total) by mouth at bedtime. 12/28/19  Yes Lendon Colonel, NP  metFORMIN (GLUCOPHAGE) 1000 MG tablet Take 500-1,000 mg by mouth See admin instructions. Take 1000 mg in the morning and 500 mg in the evening 07/28/19  Yes [provider]  nystatin cream (MYCOSTATIN) Apply 1 application topically daily as needed (yeast under breast).  05/10/19  Yes [provider]     Critical care time:  n/a     Noe Gens, MSN, NP-C [facility] 05/11/2020, 2:04 PM   Please see [email or web address] for pager details.

## 2020-05-11 NOTE — Progress Notes (Signed)
°   05/11/20 2155  Assess: MEWS Score  Temp 97.6 F (36.4 C)  BP 101/71  Pulse Rate (!) 103  ECG Heart Rate (!) 113  Resp 16  Level of Consciousness Alert  SpO2 91 %  O2 Device HFNC  Assess: MEWS Score  MEWS Temp 0  MEWS Systolic 0  MEWS Pulse 2  MEWS RR 0  MEWS LOC 0  MEWS Score 2  MEWS Score Color Yellow  Assess: if the MEWS score is Yellow or Red  Were vital signs taken at a resting state? Yes  Focused Assessment Documented focused assessment  Early Detection of Sepsis Score *See Row Information* Medium  MEWS guidelines implemented *See Row Information* No, previously yellow, continue vital signs every 4 hours  Treat  MEWS Interventions Administered prn meds/treatments;Consulted Respiratory Therapy  Escalate  MEWS: Escalate Yellow: discuss with charge nurse/RN and consider discussing with provider and RRT  Notify: Charge Nurse/RN  Name of Charge Nurse/RN Notified [place] RN  Date Charge Nurse/RN Notified 05/11/20  Time Charge Nurse/RN Notified 2100  Document  Patient Outcome  (BIpap/High flow, sats above 92)

## 2020-05-11 NOTE — Progress Notes (Signed)
  Echocardiogram 2D Echocardiogram has been performed.  Michiel Cowboy 05/11/2020, 3:37 PM

## 2020-05-11 NOTE — ED Notes (Signed)
Ordered breakfast--Amy Riddle 

## 2020-05-11 NOTE — Progress Notes (Signed)
Respiratory was able to put the BIpap to pt, however, pt kept it only for 5 mins then she took it off. Pt back on high flow again.

## 2020-05-11 NOTE — ED Notes (Signed)
Help move patient into a hospital bed patient is resting on the monitor call bell in reach placed a external cath

## 2020-05-12 ENCOUNTER — Inpatient Hospital Stay (HOSPITAL_COMMUNITY): Payer: Medicare Other

## 2020-05-12 DIAGNOSIS — J9621 Acute and chronic respiratory failure with hypoxia: Secondary | ICD-10-CM

## 2020-05-12 DIAGNOSIS — I5021 Acute systolic (congestive) heart failure: Secondary | ICD-10-CM

## 2020-05-12 DIAGNOSIS — K7460 Unspecified cirrhosis of liver: Secondary | ICD-10-CM

## 2020-05-12 DIAGNOSIS — I5023 Acute on chronic systolic (congestive) heart failure: Secondary | ICD-10-CM

## 2020-05-12 DIAGNOSIS — J9622 Acute and chronic respiratory failure with hypercapnia: Secondary | ICD-10-CM

## 2020-05-12 DIAGNOSIS — K7030 Alcoholic cirrhosis of liver without ascites: Secondary | ICD-10-CM

## 2020-05-12 DIAGNOSIS — M7989 Other specified soft tissue disorders: Secondary | ICD-10-CM

## 2020-05-12 LAB — BLOOD GAS, ARTERIAL
Acid-Base Excess: 7.6 mmol/L — ABNORMAL HIGH (ref 0.0–2.0)
Bicarbonate: 34.8 mmol/L — ABNORMAL HIGH (ref 20.0–28.0)
Drawn by: 51702
FIO2: 60
O2 Saturation: 94.1 %
Patient temperature: 36.5
pCO2 arterial: 82.4 mmHg (ref 32.0–48.0)
pH, Arterial: 7.246 — ABNORMAL LOW (ref 7.350–7.450)
pO2, Arterial: 74.3 mmHg — ABNORMAL LOW (ref 83.0–108.0)

## 2020-05-12 LAB — BASIC METABOLIC PANEL
Anion gap: 10 (ref 5–15)
BUN: 95 mg/dL — ABNORMAL HIGH (ref 8–23)
CO2: 32 mmol/L (ref 22–32)
Calcium: 9.1 mg/dL (ref 8.9–10.3)
Chloride: 95 mmol/L — ABNORMAL LOW (ref 98–111)
Creatinine, Ser: 2 mg/dL — ABNORMAL HIGH (ref 0.44–1.00)
GFR calc Af Amer: 28 mL/min — ABNORMAL LOW (ref >60)
GFR calc non Af Amer: 24 mL/min — ABNORMAL LOW (ref >60)
Glucose, Bld: 173 mg/dL — ABNORMAL HIGH (ref 70–99)
Potassium: 4.5 mmol/L (ref 3.5–5.1)
Sodium: 137 mmol/L (ref 135–145)

## 2020-05-12 LAB — ECHOCARDIOGRAM LIMITED
Height: 62.5 in
Weight: 4451.53 oz

## 2020-05-12 LAB — CBC
HCT: 51 % — ABNORMAL HIGH (ref 36.0–46.0)
Hemoglobin: 15.2 g/dL — ABNORMAL HIGH (ref 12.0–15.0)
MCH: 26.9 pg (ref 26.0–34.0)
MCHC: 29.8 g/dL — ABNORMAL LOW (ref 30.0–36.0)
MCV: 90.3 fL (ref 80.0–100.0)
Platelets: 249 10*3/uL (ref 150–400)
RBC: 5.65 MIL/uL — ABNORMAL HIGH (ref 3.87–5.11)
RDW: 17.5 % — ABNORMAL HIGH (ref 11.5–15.5)
WBC: 9.9 10*3/uL (ref 4.0–10.5)
nRBC: 1.8 % — ABNORMAL HIGH (ref 0.0–0.2)

## 2020-05-12 LAB — GLUCOSE, CAPILLARY
Glucose-Capillary: 163 mg/dL — ABNORMAL HIGH (ref 70–99)
Glucose-Capillary: 192 mg/dL — ABNORMAL HIGH (ref 70–99)
Glucose-Capillary: 182 mg/dL — ABNORMAL HIGH (ref 70–99)
Glucose-Capillary: 210 mg/dL — ABNORMAL HIGH (ref 70–99)

## 2020-05-12 LAB — TROPONIN I (HIGH SENSITIVITY)
Troponin I (High Sensitivity): 24 ng/L — ABNORMAL HIGH (ref <18)
Troponin I (High Sensitivity): 20 ng/L — ABNORMAL HIGH (ref <18)

## 2020-05-12 LAB — MAGNESIUM: Magnesium: 2.1 mg/dL (ref 1.7–2.4)

## 2020-05-12 MED ORDER — INSULIN ASPART 100 UNIT/ML ~~LOC~~ SOLN
0.0000 [IU] | Freq: Three times a day (TID) | SUBCUTANEOUS | Status: DC
  Administered 2020-05-13: 2 [IU] via SUBCUTANEOUS
  Administered 2020-05-13: 1 [IU] via SUBCUTANEOUS
  Administered 2020-05-13 – 2020-05-14 (×4): 2 [IU] via SUBCUTANEOUS
  Administered 2020-05-15: 1 [IU] via SUBCUTANEOUS
  Administered 2020-05-15: 2 [IU] via SUBCUTANEOUS
  Administered 2020-05-15: 1 [IU] via SUBCUTANEOUS
  Administered 2020-05-16 (×2): 2 [IU] via SUBCUTANEOUS
  Administered 2020-05-16 – 2020-05-18 (×5): 1 [IU] via SUBCUTANEOUS
  Administered 2020-05-18: 3 [IU] via SUBCUTANEOUS
  Administered 2020-05-18 – 2020-05-19 (×2): 1 [IU] via SUBCUTANEOUS
  Administered 2020-05-19 – 2020-05-20 (×2): 2 [IU] via SUBCUTANEOUS
  Administered 2020-05-20: 1 [IU] via SUBCUTANEOUS

## 2020-05-12 MED ORDER — FUROSEMIDE 10 MG/ML IJ SOLN: 40.0000 mg | Freq: Once | INTRAMUSCULAR | Status: DC

## 2020-05-12 MED ORDER — HALOPERIDOL LACTATE 5 MG/ML IJ SOLN
2.0000 mg | Freq: Once | INTRAMUSCULAR | Status: AC
  Administered 2020-05-12: 2 mg via INTRAVENOUS
  Filled 2020-05-12: qty 1

## 2020-05-12 MED ORDER — HALOPERIDOL LACTATE 5 MG/ML IJ SOLN
1.0000 mg | Freq: Once | INTRAMUSCULAR | Status: AC
  Administered 2020-05-12: 1 mg via INTRAVENOUS
  Filled 2020-05-12: qty 1

## 2020-05-12 MED ORDER — FUROSEMIDE 10 MG/ML IJ SOLN
40.0000 mg | Freq: Once | INTRAMUSCULAR | Status: AC
  Administered 2020-05-12: 40 mg via INTRAVENOUS
  Filled 2020-05-12: qty 4

## 2020-05-12 MED ORDER — LORAZEPAM 2 MG/ML IJ SOLN
1.0000 mg | Freq: Once | INTRAMUSCULAR | Status: AC
  Administered 2020-05-12: 1 mg via INTRAVENOUS
  Filled 2020-05-12: qty 1

## 2020-05-12 MED ORDER — ADULT MULTIVITAMIN W/MINERALS CH
1.0000 | ORAL_TABLET | Freq: Every day | ORAL | Status: DC
  Administered 2020-05-12 – 2020-05-20 (×8): 1 via ORAL
  Filled 2020-05-12 (×9): qty 1

## 2020-05-12 MED ORDER — DIGOXIN 0.1 MG/ML IJ SOLN
0.2500 mg | Freq: Once | INTRAMUSCULAR | Status: AC
  Administered 2020-05-12: 0.25 mg via INTRAVENOUS
  Filled 2020-05-12: qty 2.5

## 2020-05-12 MED ORDER — ENSURE ENLIVE PO LIQD
237.0000 mL | Freq: Three times a day (TID) | ORAL | Status: DC
  Administered 2020-05-12 – 2020-05-15 (×5): 237 mL via ORAL

## 2020-05-12 NOTE — Progress Notes (Signed)
Patient is hyper-restless pulling off mittens and  BiPaP.

## 2020-05-12 NOTE — Progress Notes (Signed)
Initial Nutrition Assessment  DOCUMENTATION CODES:   Morbid obesity  INTERVENTION:   -Ensure Enlive po TID, each supplement provides 350 kcal and 20 grams of protein -Magic cup TID with meals, each supplement provides 290 kcal and 9 grams of protein -MVI with minerals daily  NUTRITION DIAGNOSIS:   Inadequate oral intake related to lethargy/confusion as evidenced by meal completion < 25%.  GOAL:   Patient will meet greater than or equal to 90% of their needs  MONITOR:   Supplement acceptance, PO intake, Labs, Weight trends, Skin, I & O's  REASON FOR ASSESSMENT:   Rounds    ASSESSMENT:   Amy Riddle is a 74 y.o. female with medical history significant for Hx of intermittent asthma, HTN, permanent atrial fibrillation on Eliquis, Type 2 DM, and morbid obesity who presents with worsening shortness of breath.  Pt admitted with respiratory failure secondary to new onset CHF and possible asthma exacerbation.   Reviewed I/O's: -750 ml x 24 hours and -1.4 L since admission  UOP: 850 ml x 24 hours  Pt seen due to RN request. Per RN, pt just transitioned off bi-pap. She attempted to eat lunch, however, consumed very little even with a lot of encouragement from staff and daughter.   Interaction with pt was minimal due to pt ready to use the bathroom at time of visit.  Reviewed wt hx; wt has been stable. Edema may be masking true weight loss and fat and muscle depletion.   Pt with poor oral intake and would benefit from nutrient dense supplement. One Ensure Enlive supplement provides 350 kcals, 20 grams protein, and 44-45 grams of carbohydrate vs one Glucerna shake supplement, which provides 220 kcals, 10 grams of protein, and 26 grams of carbohydrate. Given pt's hx of DM, RD will reassess adequacy of PO intake, CBGS, and adjust supplement regimen as appropriate at follow-up.   Medications reviewed and include cardizem.   Lab Results  Component Value Date   HGBA1C 6.7  (H) 05/11/2020   PTA DM medications are 1000 mg metformin daily with breakfast and 500 mg daily in the evening.   Labs reviewed: CBGS: 192 (inpatient orders for glycemic control are 0-9 units inuslin aspart TID with meals).   NUTRITION - FOCUSED PHYSICAL EXAM:    Most Recent Value  Orbital Region No depletion  Upper Arm Region No depletion  Thoracic and Lumbar Region No depletion  Buccal Region No depletion  Temple Region No depletion  Clavicle Bone Region No depletion  Clavicle and Acromion Bone Region No depletion  Scapular Bone Region No depletion  Dorsal Hand No depletion  Patellar Region No depletion  Anterior Thigh Region No depletion  Posterior Calf Region No depletion  Edema (RD Assessment) Mild  Hair Reviewed  Eyes Reviewed  Mouth Reviewed  Skin Reviewed  Nails Reviewed       Diet Order:   Diet Order            Diet Heart Room service appropriate? Yes; Fluid consistency: Thin  Diet effective now                 EDUCATION NEEDS:   No education needs have been identified at this time  Skin:  Skin Assessment: Reviewed RN Assessment  Last BM:  05/10/20  Height:   Ht Readings from Last 1 Encounters:  05/11/20 5' 2.5" (1.588 m)    Weight:   Wt Readings from Last 1 Encounters:  05/12/20 126.2 kg    Ideal Body Weight:  51.1 kg  BMI:  Body mass index is 50.08 kg/m.  Estimated Nutritional Needs:   Kcal:  1800-200  Protein:  110-125 grams  Fluid:  > 1.8 L    Loistine Chance, RD, LDN, [place] Registered Dietitian II Certified Diabetes Care and Education Specialist Please refer to [facility] for RD and/or RD on-call/weekend/after hours pager

## 2020-05-12 NOTE — Progress Notes (Signed)
Bilateral lower extremity venous duplex has been completed. Preliminary results can be found in CV Proc through chart review.   05/12/20 2:55 PM Amy Riddle RVT

## 2020-05-12 NOTE — Progress Notes (Signed)
Patient was given 1mg  of haldol for hyper restless patin is still confused with some improvement in redirection how is still pulling at lines tubes.

## 2020-05-12 NOTE — Significant Event (Signed)
Pt current on High Flow 02 n/c with a o2 sat of 98% and following direction. Plan to get her oral med's in and eat in the next 30 mins.

## 2020-05-12 NOTE — Progress Notes (Signed)
   05/12/20 1055  Assess: MEWS Score  Temp 97.8 F (36.6 C)  BP 119/78  Pulse Rate (!) 114  ECG Heart Rate (!) 115  Resp (!) 21  SpO2 93 %  Assess: if the MEWS score is Yellow or Red  Were vital signs taken at a resting state? Yes  Focused Assessment Documented focused assessment  Early Detection of Sepsis Score *See Row Information* Medium  MEWS guidelines implemented *See Row Information* No, previously yellow, continue vital signs every 4 hours  Treat  MEWS Interventions Administered scheduled meds/treatments  Document  Patient Outcome Stabilized after interventions  Progress note created (see row info) Yes

## 2020-05-12 NOTE — Significant Event (Signed)
Patient confused want to to leave, combative, Hyper restless with High heart rate paged MD. PRN given. RRT placed patient back on BPAP

## 2020-05-12 NOTE — Progress Notes (Signed)
On floors at time when patient was confused and restless.  Went into room to assist nursing staff.  Pt had pulled off the BiPap mask.  Worked with nursing staff and placed pt back on Bipap.  Stayed with pt for 20 to 30 minutes monitoring VS and oxygen saturations.  Gave am nebulizer treatments also.

## 2020-05-12 NOTE — Progress Notes (Signed)
PROGRESS NOTE    Amy Riddle  VVO:[id number] DOB: January 05, 1946 DOA: 05/10/2020 PCP: Leonard Downing, MD    Brief Narrative:  Amy Riddle is a 74 y.o. female with medical history significant for Hx of intermittent asthma, HTN, permanent atrial fibrillation on Eliquis, Type 2 DM, and morbid obesity who presents with worsening shortness of breath.   Patient reports that she has been having ongoing shortness of breath that is worse this week.  Worse especially with exertion and better at rest.  Had to increase to sleeping on 2 pillows.  Has had wheezing, increased cough with sputum production.  No fever.  Also noted bilateral lower extremity edema.  She does not have a formal diagnosis of CHF.  She was evaluated by her PCP about 4 days ago and had increases to her Advair and increase of her Lasix from 40 mg to 60 mg.  However she did not feel like she had increased urine output from that.  Patient presented presented to ED on CPAP and was found to have O2 saturation in 70s.  Not able to wean down to a nonrebreather on 4 L.  Has leukocytosis of 13.1. Creatinine elevated to 2.69 from a prior of 0.70. Elevated bilirubin of 1.7. Anion gap of 18. BNP of 605.4.   CXR shows progressive right pleural effusion with increasing fluid and cardiomegaly with vascular congestion.  She was given Solu-Medrol, DuoNeb and 40 mg Lasix in the ED.   Consultants:   pccm  Procedures:  Antimicrobials:   ceftiaxone and azithromycin   Subjective: Patient seen and examined this AM.  Wearing hand mittens, confused Objective: Vitals:   05/12/20 0300 05/12/20 0327 05/12/20 0629 05/12/20 0800  BP:   104/78   Pulse:  (!) 115 (!) 117 (!) 111  Resp:  19 20   Temp:   97.7 F (36.5 C)   TempSrc:      SpO2: 93% 96% 95% (!) 85%  Weight:   126.2 kg   Height:        Intake/Output Summary (Last 24 hours) at 05/12/2020 0837 Last data filed at 05/12/2020 0500 Gross per 24 hour  Intake 100  ml  Output 850 ml  Net -750 ml   Filed Weights   05/10/20 2012 05/11/20 1734 05/12/20 0629  Weight: 128.8 kg 127.2 kg 126.2 kg    Examination:  General exam: Appears calm and comfortable , confused, on BiPAP, mittens on  respiratory system: scattered wheezing b/l, no rhonchi.  No wheezing Cardiovascular system: S1 & S2 heard, RRR. No JVD, murmurs, rubs, gallops or clicks.  Gastrointestinal system: Abdomen is nondistended, soft and nontender. Normal bowel sounds heard. Central nervous system: Awake, difficult to assess Extremities: mild edema b/lr. Skin: Warm dry Psychiatry: Confused, mood affect appropriate for current setting    Data Reviewed: I have personally reviewed following labs and imaging studies  CBC: Recent Labs  Lab 05/10/20 2010 05/12/20 0440  WBC 13.1* 9.9  NEUTROABS 10.3*  --   HGB 16.3* 15.2*  HCT 52.5* 51.0*  MCV 87.5 90.3  PLT 344 269   Basic Metabolic Panel: Recent Labs  Lab 05/10/20 2010 05/10/20 2045 05/11/20 0212 05/12/20 0440  NA 137  --  136 137  K 4.7  --  4.5 4.5  CL 92*  --  93* 95*  CO2 27  --  29 32  GLUCOSE 142*  --  175* 173*  BUN 95*  --  93* 95*  CREATININE 2.69*  --  2.56* 2.00*  CALCIUM 9.2  --  9.0 9.1  MG  --  2.1  --  2.1   GFR: Estimated Creatinine Clearance: 33.1 mL/min (A) (by C-G formula based on SCr of 2 mg/dL (H)). Liver Function Tests: Recent Labs  Lab 05/10/20 2010  AST 22  ALT 36  ALKPHOS 49  BILITOT 1.7*  PROT 6.5  ALBUMIN 3.7   No results for input(s): LIPASE, AMYLASE in the last 168 hours. No results for input(s): AMMONIA in the last 168 hours. Coagulation Profile: No results for input(s): INR, PROTIME in the last 168 hours. Cardiac Enzymes: No results for input(s): CKTOTAL, CKMB, CKMBINDEX, TROPONINI in the last 168 hours. BNP (last 3 results) No results for input(s): PROBNP in the last 8760 hours. HbA1C: Recent Labs    05/11/20 0212  HGBA1C 6.7*   CBG: Recent Labs  Lab 05/11/20 0739  05/11/20 1757 05/11/20 2158 05/12/20 0634  GLUCAP 160* 179* 202* 163*   Lipid Profile: No results for input(s): CHOL, HDL, LDLCALC, TRIG, CHOLHDL, LDLDIRECT in the last 72 hours. Thyroid Function Tests: No results for input(s): TSH, T4TOTAL, FREET4, T3FREE, THYROIDAB in the last 72 hours. Anemia Panel: No results for input(s): VITAMINB12, FOLATE, FERRITIN, TIBC, IRON, RETICCTPCT in the last 72 hours. Sepsis Labs: No results for input(s): PROCALCITON, LATICACIDVEN in the last 168 hours.  Recent Results (from the past 240 hour(s))  SARS Coronavirus 2 by RT PCR (hospital order, performed in [facility] hospital lab) Nasopharyngeal Nasopharyngeal Swab     Status: None   Collection Time: 05/10/20  8:31 PM   Specimen: Nasopharyngeal Swab  Result Value Ref Range Status   SARS Coronavirus 2 NEGATIVE NEGATIVE Final    Comment: (NOTE) SARS-CoV-2 target nucleic acids are NOT DETECTED. The SARS-CoV-2 RNA is generally detectable in upper and lower respiratory specimens during the acute phase of infection. The lowest concentration of SARS-CoV-2 viral copies this assay can detect is 250 copies / mL. A negative result does not preclude SARS-CoV-2 infection and should not be used as the sole basis for treatment or other patient management decisions.  A negative result may occur with improper specimen collection / handling, submission of specimen other than nasopharyngeal swab, presence of viral mutation(s) within the areas targeted by this assay, and inadequate number of viral copies (<250 copies / mL). A negative result must be combined with clinical observations, patient history, and epidemiological information. Fact Sheet for Patients:   StrictlyIdeas.no Fact Sheet for Healthcare Providers: BankingDealers.co.za This test is not yet approved or cleared  by the [place] FDA and has been authorized for detection and/or diagnosis of SARS-CoV-2  by FDA under an Emergency Use Authorization (EUA).  This EUA will remain in effect (meaning this test can be used) for the duration of the COVID-19 declaration under Section 564(b)(1) of the Act, 21 U.S.C. section 360bbb-3(b)(1), unless the authorization is terminated or revoked sooner. Performed at [facility], [place] [address], [place], [place] [address]   Culture, Urine     Status: None (Preliminary result)   Collection Time: 05/11/20  1:58 AM   Specimen: Urine, Catheterized  Result Value Ref Range Status   Specimen Description URINE, CATHETERIZED  Final   Special Requests   Final    ADDED Performed at [facility], [place] [address] [place], [place] [address]    Culture PENDING  Incomplete   Report Status PENDING  Incomplete  Culture, blood (Routine X 2) w Reflex to ID Panel  Status: None (Preliminary result)   Collection Time: 05/11/20  3:20 PM   Specimen: BLOOD  Result Value Ref Range Status   Specimen Description BLOOD RIGHT ANTECUBITAL  Final   Special Requests   Final    BOTTLES DRAWN AEROBIC AND ANAEROBIC Blood Culture adequate volume   Culture   Final    NO GROWTH < 24 HOURS Performed at [facility], [address] [place], [place] [address]    Report Status PENDING  Incomplete  Culture, blood (Routine X 2) w Reflex to ID Panel     Status: None (Preliminary result)   Collection Time: 05/11/20  3:20 PM   Specimen: BLOOD RIGHT HAND  Result Value Ref Range Status   Specimen Description BLOOD RIGHT HAND  Final   Special Requests   Final    BOTTLES DRAWN AEROBIC ONLY Blood Culture adequate volume   Culture   Final    NO GROWTH < 24 HOURS Performed at [facility], [place] [address], [place], [place] [address]    Report Status PENDING  Incomplete         Radiology Studies: US RENAL  Result Date: 05/11/2020 CLINICAL DATA:  Acute renal insufficiency. EXAM: RENAL / URINARY TRACT ULTRASOUND COMPLETE COMPARISON:  None. FINDINGS: Right Kidney:  Renal measurements: 11.2 cm x 6.3 cm x 6.6 cm = volume: 242.5 mL . Echogenicity within normal limits. No mass or hydronephrosis visualized. Left Kidney: Renal measurements: 11.9 cm x 6.2 cm x 6.4 cm = volume: 246.6 mL. Echogenicity within normal limits. No mass or hydronephrosis visualized. A small amount of perinephric fluid is seen on the left. Bladder: The urinary bladder is not clearly visualized. Other: None. IMPRESSION: Small amount of left-sided perinephric fluid. Electronically Signed   By: Virgina Norfolk M.D.   On: 05/11/2020 16:26   DG Chest Port 1 View  Result Date: 05/10/2020 CLINICAL DATA:  Shortness of breath and chest pain. EXAM: PORTABLE CHEST 1 VIEW COMPARISON:  Radiograph 4 days ago 05/06/2020, additional priors. FINDINGS: Cardiomegaly. Progressive right pleural effusion with increasing fluid in the minor fissure. Increasing right lung base opacity likely compressive atelectasis. Aortic atherosclerosis. Vascular congestion. No pneumothorax. IMPRESSION: 1. Progressive right pleural effusion with increasing fluid in the minor fissure over the past 4 days. 2. Cardiomegaly with vascular congestion. Electronically Signed   By: Keith Rake M.D.   On: 05/10/2020 20:42   ECHOCARDIOGRAM COMPLETE  Result Date: 05/11/2020    ECHOCARDIOGRAM REPORT   Patient Name:   ANNALY SKOP Date of Exam: 05/11/2020 Medical Rec #:  [id number]            Height:       62.0 in Accession #:    [id number]           Weight:       284.0 lb Date of Birth:  November 02, 1946            BSA:          2.219 m Patient Age:    78 years             BP:           121/60 mmHg Patient Gender: F                    HR:           67 bpm. Exam Location:  Inpatient Procedure: 2D Echo, Cardiac Doppler and Color Doppler Indications:    CHF-Acute Systolic  428.21 / I50.21  History:        Patient has prior history of Echocardiogram examinations, most                 recent 09/01/2019. CHF, Arrythmias:Atrial Fibrillation; Risk                  Factors:Hypertension, Diabetes and Former Smoker.  Sonographer:    Vickie Epley RDCS Referring Phys: [id number] [place]  Sonographer Comments: No subcostal window. IMPRESSIONS  1. Left ventricular ejection fraction, by estimation, is 40 to 45%. The left ventricle has mildly decreased function. The left ventricle demonstrates global hypokinesis. Left ventricular diastolic function could not be evaluated. There is the interventricular septum is flattened in systole, consistent with right ventricular pressure overload.  2. Right ventricular systolic function is moderately reduced. The right ventricular size is severely enlarged. Tricuspid regurgitation signal is inadequate for assessing PA pressure.  3. Left atrial size was mildly dilated.  4. Right atrial size was severely dilated.  5. The mitral valve is normal in structure. Trivial mitral valve regurgitation.  6. The aortic valve was not well visualized. Aortic valve regurgitation is not visualized. No aortic stenosis is present. Conclusion(s)/Recommendation(s): LV EF reduced, but RV size/function is severely abnormal. TR ject inadequate for assessing PA pressure, and IVC not well seen. On direct comparison to prior, RV size/function appeared mildly abnormal before, but this is a  significant change. Concerning for RV pressure overload. No definitive McConnell's sign but appears consistent with pulmonary hypertension or PE. FINDINGS  Left Ventricle: Left ventricular ejection fraction, by estimation, is 40 to 45%. The left ventricle has mildly decreased function. The left ventricle demonstrates global hypokinesis. The left ventricular internal cavity size was normal in size. There is  no left ventricular hypertrophy. The interventricular septum is flattened in systole, consistent with right ventricular pressure overload. Left ventricular diastolic function could not be evaluated due to atrial fibrillation. Left ventricular diastolic function could not be  evaluated. Right Ventricle: The right ventricular size is severely enlarged. No increase in right ventricular wall thickness. Right ventricular systolic function is moderately reduced. Tricuspid regurgitation signal is inadequate for assessing PA pressure. Left Atrium: Left atrial size was mildly dilated. Right Atrium: Right atrial size was severely dilated. Pericardium: A small pericardial effusion is present. Mitral Valve: The mitral valve is normal in structure. Trivial mitral valve regurgitation. Tricuspid Valve: The tricuspid valve is normal in structure. Tricuspid valve regurgitation is trivial. No evidence of tricuspid stenosis. Aortic Valve: The aortic valve was not well visualized. . There is mild thickening and mild calcification of the aortic valve. Aortic valve regurgitation is not visualized. No aortic stenosis is present. Mild aortic valve annular calcification. There is mild thickening of the aortic valve. There is mild calcification of the aortic valve. Pulmonic Valve: The pulmonic valve was not well visualized. Pulmonic valve regurgitation is not visualized. No evidence of pulmonic stenosis. Aorta: The aortic root is normal in size and structure. Venous: The inferior vena cava was not well visualized. IAS/Shunts: The atrial septum is grossly normal.  LEFT VENTRICLE PLAX 2D LVOT diam:     2.50 cm LV SV:         50 LV SV Index:   22 LVOT Area:     4.91 cm  LV Volumes (MOD) LV vol d, MOD A2C: 67.3 ml LV vol d, MOD A4C: 69.8 ml LV vol s, MOD A2C: 38.7 ml LV vol s, MOD A4C: 44.3 ml LV SV  MOD A2C:     28.6 ml LV SV MOD A4C:     69.8 ml LV SV MOD BP:      27.4 ml RIGHT VENTRICLE TAPSE (M-mode): 1.1 cm LEFT ATRIUM             Index       RIGHT ATRIUM           Index LA Vol (A2C):   46.2 ml 20.80 ml/m RA Area:     27.00 cm LA Vol (A4C):   54.5 ml 24.56 ml/m RA Volume:   97.10 ml  43.76 ml/m LA Biplane Vol: 54.7 ml 24.65 ml/m  AORTIC VALVE LVOT Vmax:   73.20 cm/s LVOT Vmean:  45.600 cm/s LVOT VTI:     0.101 m  AORTA Ao Root diam: 3.40 cm  SHUNTS Systemic VTI:  0.10 m Systemic Diam: 2.50 cm Buford Dresser MD Electronically signed by Buford Dresser MD Signature Date/Time: 05/11/2020/9:52:49 PM    Final         Scheduled Meds: . apixaban  5 mg Oral BID  . arformoterol  15 mcg Nebulization BID  . budesonide (PULMICORT) nebulizer solution  0.5 mg Nebulization BID  . chlorhexidine  15 mL Mouth Rinse BID  . Chlorhexidine Gluconate Cloth  6 each Topical Daily  . digoxin  0.25 mg Intravenous Once  . diltiazem  360 mg Oral Daily  . insulin aspart  0-9 Units Subcutaneous TID WC  . mouth rinse  15 mL Mouth Rinse q12n4p  . methylPREDNISolone (SOLU-MEDROL) injection  60 mg Intravenous Q12H  . sodium chloride flush  3 mL Intravenous Q12H   Continuous Infusions: . sodium chloride    . azithromycin 500 mg (05/11/20 1439)  . cefTRIAXone (ROCEPHIN)  IV Stopped (05/11/20 1543)    Assessment & Plan:   Principal Problem:   Acute CHF (congestive heart failure) (HCC) Active Problems:   Diabetes (HCC)   Hypotension   AKI (acute kidney injury) ([place])   Permanent atrial fibrillation ([place])   Acute hypoxemic and hypercapnic respiratory failure -multifactoria. CHF and possible asthma exacerbation Last Echo is 45-50% in 08/2019.  Will repeat. Strict I's and O's.  Daily weights PRN Xopenex Pulmonary consulted, input was appreciated-held Lasix.  Started on IV steroids...>now discontinued as it may cause delirium -wean O2 for sats 90-95% -brovana + pulmicort BID -PRN xopenex  IV antibiotics empirically Rt pleural effusion may need ?thoracentesis . continue BiPAP QHS and PRN daytime sleep  EchO:EF 40-45%, rv ef mildly reduced, rv volume overload, rv enlarged, elevated PASP Plan: Will discuss with pccm r/o PE even if she is on eliquis with echo findings.  May need prn lasix.  Ck bnp  Hypotension Somewhat improved without intervention.  We will hold antihypertensive  AKI Likely  from vascular congestion. Received lasix iv. Monitor closely, if no improvement will consult nephrology renal US: Small amount of left-sided perinephric fluid.   Permanent atrial fibrillation Nonsustained RVR worse with exertion Continue diltiazem but will hold losartan due to AKI and some hypotension Continue Eliquis  Non-insulin-dependent type 2 diabetes Low-dose sliding scale   DVT prophylaxis: Eliquis Code Status: DNR Family Communication: None at bedside Disposition Plan: Back to Home Inpatient status Remains inpatient appropriate because:Inpatient level of care appropriate due to severity of illness  Dispo: The patient is from: Home  Anticipated d/c is to: Home  Anticipated d/c date is: > 3 days  Patient currently is not medically stable to d/c.    LOS: 2 days  Time spent: 45 min with more than >50% on coc    Nolberto Hanlon, MD [facility] Pager [phone]  If 7PM-7AM, please contact night-coverage [email or web address] Password [facility] 05/12/2020, 8:37 AM Patient ID: LACE CHENEVERT, female   DOB: 12/27/1945, 74 y.o.   MRN: [id number]

## 2020-05-12 NOTE — Progress Notes (Addendum)
Safety sitter pulled and reassigned away from this patient. Pt immediately started taking high flow nasal cannula off. MD paged.

## 2020-05-12 NOTE — Progress Notes (Signed)
Patient is drowsy will not keep bipap on put on non-rebreather SATs are 100% RR 22. Looks more comfortable

## 2020-05-12 NOTE — Progress Notes (Addendum)
Pt started to be  confused, ABG drawn. PT was put on Bipap with mittens on, still pt is fidgety touching her bipap frequently. Paged NP Sharlet Salina and got an order to calm her down. Will monitor  CRITICAL VALUE ALERT  Critical Value:  ABG CO2 82.4  Date & Time Notied:  05/12/20 0300  Provider Notified: NP Sharlet Salina  Orders Received/Actions taken: yes

## 2020-05-12 NOTE — Progress Notes (Signed)
Pt is awake, alert and she took her Bipap off. Pt was switched to HFNC.  She was on Bipap atleast 4hours. Will pass on on.

## 2020-05-12 NOTE — Progress Notes (Signed)
°  Echocardiogram 2D Echocardiogram has been performed.  Amy Riddle 05/12/2020, 10:03 AM

## 2020-05-12 NOTE — Progress Notes (Signed)
NAME:  Amy Riddle, MRN:  [id number], DOB:  10/18/1946, LOS: 2 ADMISSION DATE:  05/10/2020, CONSULTATION DATE: 05/11/20  REFERRING MD:  Dr. Kurtis Bushman / [facility] , CHIEF COMPLAINT:  SOB  Brief History   74 y/o F admitted 6/8 with reports of 2 weeks of increasing shortness of breath.    History of present illness   74 y/o F who presented 6/8 via EMS with reports of 2 weeks increasing shortness of breath.    Found to have room air saturations of 72-74% on EMS arrival.  They treated the patient with CPAP with improvement in symptoms.  She reported increased lower extremity swelling with 10 lb weight gain. She also noted increased HR with AF over the past weeks.  She was seen by her PCP 4 days prior to presentation and her lasix was increased from 40-60 mg and her Advair was also increased.    The patient was initially hypotensive in the ER with BP 63/37 and tachycardic to 160's but this resolved.  Initial work up notable for BUN 95 / sr cr 2.69, AG 18, BNP 605, WBC 13.1, Hgb 16.3, platelets 344, Hgb A1c 6.7, COVID testing negative. UA with moderate Hgb, negative ketones, 30 protein, rare bacteria, 6-10 WBC. Initial CXR showed progressive right pleural effusion, cardiomegaly and vascular congestion.  There is report of a venous blood gas with a ph of 7.1 / pCO2 90 but it is not listed in the computer.  She was afebrile.    PCCM consulted for evaluation.   Past Medical History  A-Fib - on eliquis, cardizem.  Has required cardioversion   HTN - on losartan, lasix.  Prior LVEF 40-45% Asthma - on advair  DM II - on metformin  Morbid Obesity  Former Smoker - quit [facility]   6/08 Admit  6/09 PCCM consulted   Consults:    Procedures:    Significant Diagnostic Tests:  Limited ECHO 6/9 >> LVEF ~ 40-45%, LV mildly decreased function, LV demonstrates global hypokinesis, interventricular septum flattened in systole consistent with RV pressure overload, RV systolic function  moderately reduced, RV is severely enlarged, LA mildly dilated, RA severely dilated Renal US 6/9 >> no mass or hydronephrosis, small amount left-sided perinephric fluid  Micro Data:  COVID 6/8 >> negative   Antimicrobials:  Rocephin 6/9 >>  Azithro 6/9 >>   Interim history/subjective:  Pt intermittently wore bipap overnight  RN reports intermittent agitated delirium / pulling mask off, attempting to get out of bed > received haldol  Afebrile  Remains in AF 100-120's  Objective   Blood pressure 104/78, pulse (!) 111, temperature 97.7 F (36.5 C), resp. rate 20, height 5' 2.5" (1.588 m), weight 126.2 kg, SpO2 (!) 85 %.    FiO2 (%):  [50 %] 50 %   Intake/Output Summary (Last 24 hours) at 05/12/2020 0837 Last data filed at 05/12/2020 0500 Gross per 24 hour  Intake 100 ml  Output 850 ml  Net -750 ml   Filed Weights   05/10/20 2012 05/11/20 1734 05/12/20 0629  Weight: 128.8 kg 127.2 kg 126.2 kg    Examination: General: elderly adult female lying in bed in NAD on bipap  HEENT: MM pink/dry, bipap mask in place Neuro: sedate, wakes with stimulation and attempts to pull bipap mask off, MAE  CV: s1s2 rrr, no m/r/g PULM:  Non-labored on BiPAP, lungs bilaterally clear with good air movement, diminished bases GI: soft, bsx4 active  Extremities: warm/dry, trace LE edema  Skin: no rashes or lesions  Resolved Hospital Problem list      Assessment & Plan:   Acute Hypoxic / Hypercarbic (reported venous pCO2 90) Respiratory Failure  Suspected Asthma Exacerbation  Right Pleural Effusion  Suspected OSA -continue BiPAP QHS and PRN daytime sleep  -follow CXR  -wean O2 for sats 90-95% -brovana + pulmicort BID, PRN xopenex -stop solumedrol as may be contributing to delirium  -no lasix with resolving AKI  AKI  Suspect recent increase in medications, volume depletion, hypotension contributing  -Trend BMP / urinary output -Replace electrolytes as indicated -Avoid nephrotoxic agents,  ensure adequate renal perfusion  Possible UTI  -continue empiric abx as above -follow cultures to maturity   AF  HTN HFrEF  On eliquis, cardizem at baseline  -limited ECHO as above -continue eliquis, cardizem -assess troponin, EKG, LE doppler.  Doubt PE as is on Eliquis.   DM II  -per primary   Class 3 Obesity  -per primary    Best practice:  Diet: NPO while on bipap  Pain/Anxiety/Delirium protocol (if indicated): n/a  VAP protocol (if indicated): n/a  DVT prophylaxis: eliquis GI prophylaxis: n/a  Glucose control: per primary  Mobility: as tolerated  Code Status: DNR  Family Communication: per primary  Disposition: Progressive  Labs   CBC: Recent Labs  Lab 05/10/20 2010 05/12/20 0440  WBC 13.1* 9.9  NEUTROABS 10.3*  --   HGB 16.3* 15.2*  HCT 52.5* 51.0*  MCV 87.5 90.3  PLT 344 408    Basic Metabolic Panel: Recent Labs  Lab 05/10/20 2010 05/10/20 2045 05/11/20 0212 05/12/20 0440  NA 137  --  136 137  K 4.7  --  4.5 4.5  CL 92*  --  93* 95*  CO2 27  --  29 32  GLUCOSE 142*  --  175* 173*  BUN 95*  --  93* 95*  CREATININE 2.69*  --  2.56* 2.00*  CALCIUM 9.2  --  9.0 9.1  MG  --  2.1  --  2.1   GFR: Estimated Creatinine Clearance: 33.1 mL/min (A) (by C-G formula based on SCr of 2 mg/dL (H)). Recent Labs  Lab 05/10/20 2010 05/12/20 0440  WBC 13.1* 9.9    Liver Function Tests: Recent Labs  Lab 05/10/20 2010  AST 22  ALT 36  ALKPHOS 49  BILITOT 1.7*  PROT 6.5  ALBUMIN 3.7   No results for input(s): LIPASE, AMYLASE in the last 168 hours. No results for input(s): AMMONIA in the last 168 hours.  ABG    Component Value Date/Time   PHART 7.246 (L) 05/12/2020 0245   PCO2ART 82.4 (HH) 05/12/2020 0245   PO2ART 74.3 (L) 05/12/2020 0245   HCO3 34.8 (H) 05/12/2020 0245   O2SAT 94.1 05/12/2020 0245     Coagulation Profile: No results for input(s): INR, PROTIME in the last 168 hours.  Cardiac Enzymes: No results for input(s): CKTOTAL,  CKMB, CKMBINDEX, TROPONINI in the last 168 hours.  HbA1C: Hgb A1c MFr Bld  Date/Time Value Ref Range Status  05/11/2020 02:12 AM 6.7 (H) 4.8 - 5.6 % Final    Comment:    (NOTE) Pre diabetes:          5.7%-6.4% Diabetes:              >6.4% Glycemic control for   <7.0% adults with diabetes     CBG: Recent Labs  Lab 05/11/20 0739 05/11/20 1757 05/11/20 2158 05/12/20 0634  GLUCAP 160* 179* 202* 163*  Critical care time:  n/a     Noe Gens, MSN, NP-C [facility] 05/12/2020, 8:37 AM   Please see [email or web address] for pager details.

## 2020-05-13 ENCOUNTER — Inpatient Hospital Stay (HOSPITAL_COMMUNITY): Payer: Medicare Other

## 2020-05-13 DIAGNOSIS — N179 Acute kidney failure, unspecified: Secondary | ICD-10-CM

## 2020-05-13 DIAGNOSIS — R0902 Hypoxemia: Secondary | ICD-10-CM

## 2020-05-13 DIAGNOSIS — I4821 Permanent atrial fibrillation: Secondary | ICD-10-CM

## 2020-05-13 LAB — BLOOD GAS, ARTERIAL
Acid-Base Excess: 10.5 mmol/L — ABNORMAL HIGH (ref 0.0–2.0)
Acid-Base Excess: 11 mmol/L — ABNORMAL HIGH (ref 0.0–2.0)
Bicarbonate: 37.7 mmol/L — ABNORMAL HIGH (ref 20.0–28.0)
Bicarbonate: 37.7 mmol/L — ABNORMAL HIGH (ref 20.0–28.0)
FIO2: 40
FIO2: 40
O2 Saturation: 96.6 %
O2 Saturation: 88.4 %
Patient temperature: 36.5
Patient temperature: 36.5
pCO2 arterial: 86.7 mmHg (ref 32.0–48.0)
pCO2 arterial: 78.7 mmHg (ref 32.0–48.0)
pH, Arterial: 7.258 — ABNORMAL LOW (ref 7.350–7.450)
pH, Arterial: 7.299 — ABNORMAL LOW (ref 7.350–7.450)
pO2, Arterial: 88.9 mmHg (ref 83.0–108.0)
pO2, Arterial: 54.8 mmHg — ABNORMAL LOW (ref 83.0–108.0)

## 2020-05-13 LAB — BASIC METABOLIC PANEL
Anion gap: 10 (ref 5–15)
BUN: 97 mg/dL — ABNORMAL HIGH (ref 8–23)
CO2: 35 mmol/L — ABNORMAL HIGH (ref 22–32)
Calcium: 9.6 mg/dL (ref 8.9–10.3)
Chloride: 95 mmol/L — ABNORMAL LOW (ref 98–111)
Creatinine, Ser: 1.78 mg/dL — ABNORMAL HIGH (ref 0.44–1.00)
GFR calc Af Amer: 33 mL/min — ABNORMAL LOW (ref >60)
GFR calc non Af Amer: 28 mL/min — ABNORMAL LOW (ref >60)
Glucose, Bld: 176 mg/dL — ABNORMAL HIGH (ref 70–99)
Potassium: 5.2 mmol/L — ABNORMAL HIGH (ref 3.5–5.1)
Sodium: 140 mmol/L (ref 135–145)

## 2020-05-13 LAB — URINE CULTURE: Culture: >=100000 — AB

## 2020-05-13 LAB — GLUCOSE, CAPILLARY
Glucose-Capillary: 162 mg/dL — ABNORMAL HIGH (ref 70–99)
Glucose-Capillary: 161 mg/dL — ABNORMAL HIGH (ref 70–99)
Glucose-Capillary: 124 mg/dL — ABNORMAL HIGH (ref 70–99)
Glucose-Capillary: 150 mg/dL — ABNORMAL HIGH (ref 70–99)

## 2020-05-13 LAB — BRAIN NATRIURETIC PEPTIDE: B Natriuretic Peptide: 487.1 pg/mL — ABNORMAL HIGH (ref 0.0–100.0)

## 2020-05-13 LAB — MAGNESIUM: Magnesium: 2.6 mg/dL — ABNORMAL HIGH (ref 1.7–2.4)

## 2020-05-13 MED ORDER — TECHNETIUM TO 99M ALBUMIN AGGREGATED
1.6000 | Freq: Once | INTRAVENOUS | Status: AC | PRN
  Administered 2020-05-13: 1.6 via INTRAVENOUS

## 2020-05-13 MED ORDER — LORAZEPAM 2 MG/ML IJ SOLN
1.0000 mg | Freq: Once | INTRAMUSCULAR | Status: AC
  Administered 2020-05-13: 1 mg via INTRAVENOUS
  Filled 2020-05-13: qty 1

## 2020-05-13 NOTE — Progress Notes (Addendum)
Patient more alert upon reassessment, continues to pull BiPAP off despite safety mitts and efforts of family at bedside, adamantly refuses to wear it. Current oxygen saturation on 9 L via high flow nasal cannula is at 93%. Kurtis Bushman, MD paged. Patient alert and oriented to self, place, and time, disoriented to situation. Kurtis Bushman, MD returned page, verbal orders received to give PO medications while off of BiPAP, then place back on BiPAP. RN will continue to educate patient and family members at bedside on importance and purpose of leaving BiPAP on. Will attempt to get a safety sitter per order.

## 2020-05-13 NOTE — Progress Notes (Signed)
Pt able to tolerate BiPAP for 4 hours after Ativan 1mg  IV given. Pt now removing BiPAP and attempting to get out of bed. High Flow Nasal Cannula placed on patient and bed raised into a chair position.

## 2020-05-13 NOTE — Progress Notes (Signed)
NAME:  Amy Riddle, MRN:  [id number], DOB:  1946/03/02, LOS: 3 ADMISSION DATE:  05/10/2020, CONSULTATION DATE: 05/11/20  REFERRING MD:  Dr. Kurtis Bushman / [facility] , CHIEF COMPLAINT:  SOB  Brief History   74 y/o F admitted 6/8 with reports of 2 weeks of increasing shortness of breath.    History of present illness   74 y/o F who presented 6/8 via EMS with reports of 2 weeks increasing shortness of breath.    Found to have room air saturations of 72-74% on EMS arrival.  They treated the patient with CPAP with improvement in symptoms.  She reported increased lower extremity swelling with 10 lb weight gain. She also noted increased HR with AF over the past weeks.  She was seen by her PCP 4 days prior to presentation and her lasix was increased from 40-60 mg and her Advair was also increased.    The patient was initially hypotensive in the ER with BP 63/37 and tachycardic to 160's but this resolved.  Initial work up notable for BUN 95 / sr cr 2.69, AG 18, BNP 605, WBC 13.1, Hgb 16.3, platelets 344, Hgb A1c 6.7, COVID testing negative. UA with moderate Hgb, negative ketones, 30 protein, rare bacteria, 6-10 WBC. Initial CXR showed progressive right pleural effusion, cardiomegaly and vascular congestion.  There is report of a venous blood gas with a ph of 7.1 / pCO2 90 but it is not listed in the computer.  She was afebrile.    PCCM consulted for evaluation.   Past Medical History  A-Fib - on eliquis, cardizem.  Has required cardioversion   HTN - on losartan, lasix.  Prior LVEF 40-45% Asthma - on advair  DM II - on metformin  Morbid Obesity  Former Smoker - quit [facility]   6/08 Admit  6/09 PCCM consulted   Consults:    Procedures:    Significant Diagnostic Tests:  Limited ECHO 6/9 >> LVEF ~ 40-45%, LV mildly decreased function, LV demonstrates global hypokinesis, interventricular septum flattened in systole consistent with RV pressure overload, RV systolic function  moderately reduced, RV is severely enlarged, LA mildly dilated, RA severely dilated Renal US 6/9 >> no mass or hydronephrosis, small amount left-sided perinephric fluid  Micro Data:  COVID 6/8 >> negative   Antimicrobials:  Rocephin 6/9 >>  Azithro 6/9 >>   Interim history/subjective:  Currently in atrial fibrillation with  ventricular contractions.  Extremely sedated from nocturnal Ativan due to agitation.  Request to be placed back on noninvasive mechanical ventilatory support.  She should not receive benzodiazepines during the night.  Objective   Blood pressure 120/88, pulse (!) 104, temperature 97.7 F (36.5 C), temperature source Oral, resp. rate 20, height 5' 2.5" (1.588 m), weight 125.5 kg, SpO2 90 %.    FiO2 (%):  [40 %] 40 %   Intake/Output Summary (Last 24 hours) at 05/13/2020 0747 Last data filed at 05/13/2020 3335 Gross per 24 hour  Intake 623 ml  Output 1405 ml  Net -782 ml   Filed Weights   05/11/20 1734 05/12/20 0629 05/13/20 0416  Weight: 127.2 kg 126.2 kg 125.5 kg    Examination: General: Morbidly obese female who appears heavily sedated and barely able to interact. HEENT: Pupils equal react to light.  No JVD or lymphadenopathy is appreciated Neuro: Somnolent somewhat stunned can answer some simple questions but unable to open eyes and focus CV: Multiple premature ventricular contraction with underlying heart rate of 120 PULM:  Decreased air movement throughout.  Despite being on 10 L nasal cannula O2 saturations were 68%.  Nasal cannula placed back in her nares.  Nurses were called to have respiratory therapy resume BiPAP since she received Ativan during the night and is extremely sleepy.  Noted to have paroxysmal chest wall movement poor chest wall excursion. GI: Obese soft positive bowel sounds  Extremities: warm/dry, 2+ edema  Skin: no rashes or lesions   Resolved Hospital Problem list      Assessment & Plan:   Acute Hypoxic / Hypercarbic (reported  venous pCO2 90) Respiratory Failure  Suspected Asthma Exacerbation  Right Pleural Effusion  Suspected OSA Continue BiPAP nightly and as needed Serial chest x-rays Wean O2 as tolerated Bronchodilators Off steroids Continue to have nocturnal agitation for which she was treated with Ativan on 05/12/2020 and is severely somnolent and suspected to be hypercarbic.  Therefore will hold any benzodiazepines in the future AKI  Lab Results  Component Value Date   CREATININE 2.00 (H) 05/12/2020   CREATININE 2.56 (H) 05/11/2020   CREATININE 2.69 (H) 05/10/2020  Suspect recent increase in medications, volume depletion, hypotension contributing   Continue to monitor creatinine Electrolyte repletion as needed May consider resuming diuresis  Possible UTI  Continue antibiotics per primary  AF  HTN HFrEF  On Eliquis and Cardizem at baseline continue 2D echo on 05/13/1999 EF of 40-45 Global hypokinesis left mildly reduced right ventricular function. Lower extremity Doppler studies were - 05/12/2020  DM II  CBG (last 3)  Recent Labs    05/12/20 1700 05/12/20 2111 05/13/20 0607  GLUCAP 182* 210* 162*    Per primary  Class 3 Obesity  Filed Weights   05/11/20 1734 05/12/20 0629 05/13/20 0416  Weight: 127.2 kg 126.2 kg 125.5 kg   Per primary   Best practice:  Diet: NPO while on bipap  Pain/Anxiety/Delirium protocol (if indicated): n/a  VAP protocol (if indicated): n/a  DVT prophylaxis: eliquis GI prophylaxis: n/a  Glucose control: per primary  Mobility: as tolerated  Code Status: DNR  Family Communication: per primary  Disposition: Progressive  Labs   CBC: Recent Labs  Lab 05/10/20 2010 05/12/20 0440  WBC 13.1* 9.9  NEUTROABS 10.3*  --   HGB 16.3* 15.2*  HCT 52.5* 51.0*  MCV 87.5 90.3  PLT 344 563    Basic Metabolic Panel: Recent Labs  Lab 05/10/20 2010 05/10/20 2045 05/11/20 0212 05/12/20 0440  NA 137  --  136 137  K 4.7  --  4.5 4.5  CL 92*  --  93* 95*    CO2 27  --  29 32  GLUCOSE 142*  --  175* 173*  BUN 95*  --  93* 95*  CREATININE 2.69*  --  2.56* 2.00*  CALCIUM 9.2  --  9.0 9.1  MG  --  2.1  --  2.1   GFR: Estimated Creatinine Clearance: 33 mL/min (A) (by C-G formula based on SCr of 2 mg/dL (H)). Recent Labs  Lab 05/10/20 2010 05/12/20 0440  WBC 13.1* 9.9    Liver Function Tests: Recent Labs  Lab 05/10/20 2010  AST 22  ALT 36  ALKPHOS 49  BILITOT 1.7*  PROT 6.5  ALBUMIN 3.7   No results for input(s): LIPASE, AMYLASE in the last 168 hours. No results for input(s): AMMONIA in the last 168 hours.  ABG    Component Value Date/Time   PHART 7.246 (L) 05/12/2020 0245   PCO2ART 82.4 ([place]) 05/12/2020 0245  PO2ART 74.3 (L) 05/12/2020 0245   HCO3 34.8 (H) 05/12/2020 0245   O2SAT 94.1 05/12/2020 0245     Coagulation Profile: No results for input(s): INR, PROTIME in the last 168 hours.  Cardiac Enzymes: No results for input(s): CKTOTAL, CKMB, CKMBINDEX, TROPONINI in the last 168 hours.  HbA1C: Hgb A1c MFr Bld  Date/Time Value Ref Range Status  05/11/2020 02:12 AM 6.7 (H) 4.8 - 5.6 % Final    Comment:    (NOTE) Pre diabetes:          5.7%-6.4% Diabetes:              >6.4% Glycemic control for   <7.0% adults with diabetes     CBG: Recent Labs  Lab 05/12/20 0634 05/12/20 1325 05/12/20 1700 05/12/20 2111 05/13/20 0607  GLUCAP 163* 192* 182* 210* 162*     Critical care time:  n/a     Richardson Landry Toye Rouillard ACNP Acute Care Nurse Practitioner [place] Please consult Amion 05/13/2020, 7:48 AM

## 2020-05-13 NOTE — Progress Notes (Signed)
Called the patient's daughter to update regarding application of restraints. Daughter understands the situation. All questions and concerns addressed. Will monitor

## 2020-05-13 NOTE — Progress Notes (Signed)
Pharmacist Heart Failure Core Measure Documentation  Assessment: Amy Riddle has an EF documented as 40-45% on 05/12/20 by Echo.  Rationale: Heart failure patients with left ventricular systolic dysfunction (LVSD) and an EF < 40% should be prescribed an angiotensin converting enzyme inhibitor (ACEI) or angiotensin receptor blocker (ARB) at discharge unless a contraindication is documented in the medical record.  This patient is not currently on an ACEI or ARB for HF.  This note is being placed in the record in order to provide documentation that a contraindication to the use of these agents is present for this encounter.  ACE Inhibitor or Angiotensin Receptor Blocker is contraindicated (specify all that apply)  []   ACEI allergy AND ARB allergy []   Angioedema []   Moderate or severe aortic stenosis []   Hyperkalemia []   Hypotension []   Renal artery stenosis [x]   Worsening renal function, preexisting renal disease or dysfunction   Benetta Spar, PharmD, BCPS, BCCP Clinical Pharmacist  Please check AMION for all [place] phone numbers After 10:00 PM, call [place]

## 2020-05-13 NOTE — Progress Notes (Addendum)
CRITICAL VALUE ALERT  Critical Value:  PCO2- 86.7  Date & Time Notied:  05/13/2020 @ 0902  Provider Notified: Kurtis Bushman, MD  Orders Received/Actions taken: Verbal orders received form Amery, MD to keep patient on BiPAP. RN will implement orders- patient currently on BiPAP.

## 2020-05-13 NOTE — Progress Notes (Addendum)
Patient off of unit to radiology. Per Tamala Julian, Pulmonary MD at bedside, okay for patient to stay off of BiPAP and on high flow nasal canula if patient's LOC will allow (if patient does not become more lethargic). Patient went to radiology on 9 L HFNC in a wheelchair, per Tamala Julian MD this is okay. Morning medications given before patient left unit.

## 2020-05-13 NOTE — Progress Notes (Signed)
Patient returned to unit. IV in left wrist is bleeding upon arrival and has been removed while off of unit. IV team consult placed. Oxygen saturation at 96% on 9 L HFNC, patient alert and oriented X3, denies any complaints at this time.

## 2020-05-13 NOTE — Progress Notes (Addendum)
Pt alert/oriented to self and place. Pt keeps on taking Bipap off multiple times despite safety mittens and being consistently reminded not to . Pt gets agitated and combative when staff puts the bipap back on, adamantly refuses to put it back on. Desats to 80% when off bipap. MD notified.

## 2020-05-13 NOTE — Progress Notes (Signed)
   05/13/20 2000  Assess: MEWS Score  Temp 97.8 F (36.6 C)  BP (!) 139/97  Pulse Rate (!) 109  ECG Heart Rate (!) 106  Resp (!) 22  SpO2 97 %  O2 Device Bi-PAP  Assess: MEWS Score  MEWS Temp 0  MEWS Systolic 0  MEWS Pulse 1  MEWS RR 1  MEWS LOC 0  MEWS Score 2  MEWS Score Color Yellow  Assess: if the MEWS score is Yellow or Red  Were vital signs taken at a resting state? Yes  Focused Assessment Documented focused assessment  Early Detection of Sepsis Score *See Row Information* Low  MEWS guidelines implemented *See Row Information* No, vital signs rechecked  Treat  MEWS Interventions Escalated (See documentation below)  Take Vital Signs  Increase Vital Sign Frequency  Yellow: Q 2hr X 2 then Q 4hr X 2, if remains yellow, continue Q 4hrs  Escalate  MEWS: Escalate Yellow: discuss with charge nurse/RN and consider discussing with provider and RRT  Notify: Charge Nurse/RN  Name of Charge Nurse/RN Notified Danae Chen RN  Date Charge Nurse/RN Notified 05/13/20  Time Charge Nurse/RN Notified 2006  Notify: Provider  Provider Name/Title Opyd MD  Date Provider Notified 05/13/20  Time Provider Notified 2100  Notification Type Page  Notification Reason Other (Comment) (Pt keeps on taking bipap off. read progress note)  Response See new orders  Date of Provider Response 05/13/20  Time of Provider Response 2110  Notify: Rapid Response  Name of Rapid Response RN Notified  (n/a)  Document  Patient Outcome Stabilized after interventions  Progress note created (see row info) Yes

## 2020-05-13 NOTE — Progress Notes (Signed)
Pt NPO for Ultrasound.

## 2020-05-13 NOTE — Progress Notes (Signed)
Upon morning shift assessment, patient hard to awaken, but arouses to voice. Vital signs are as follows:   05/13/20 0913  Vitals  Temp 97.8 F (36.6 C)  Temp Source Oral  BP 102/71  MAP (mmHg) 80  BP Location Left Leg  BP Method Automatic  Patient Position (if appropriate) Lying  Pulse Rate (!) 103  Pulse Rate Source Monitor  Resp 18  Level of Consciousness  Level of Consciousness Responds to Voice  Oxygen Therapy  SpO2 99 %  O2 Device Bi-PAP  MEWS Score  MEWS Temp 0  MEWS Systolic 0  MEWS Pulse 1  MEWS RR 0  MEWS LOC 1  MEWS Score 2  MEWS Score Color Yellow   Amery, MD paged regarding LOC. Verbal orders received to leave patient on BiPAP, no Ativan should be given. RN voiced concerns to giving PO medications, verbal orders received to not give oral medications at this time and check back once patient is more alert. RN will carry out orders and continue to monitor vitals and LOC of patient.

## 2020-05-13 NOTE — Progress Notes (Signed)
PT Cancellation Note  Patient Details Name: Amy Riddle MRN: [id number] DOB: 1946-10-08   Cancelled Treatment:    Reason Eval/Treat Not Completed: Medical issues which prohibited therapy.  PCO2 is quite elevated, hypoxic and medically less stable.  Will re-attempt at another time as pt can allow.   Ramond Dial 05/13/2020, 11:45 AM   Mee Hives, PT MS Acute Rehab Dept. Number: [place] [facility] [place]

## 2020-05-13 NOTE — Progress Notes (Signed)
CRITICAL VALUE ALERT  Critical Value:  PCO2- 78.7  Date & Time Notied:  05/13/2020 @ 1218  Provider Notified: Kurtis Bushman, MD  Orders Received/Actions taken: Awaiting response.

## 2020-05-13 NOTE — Progress Notes (Addendum)
Restraints order received. Soft wrist restraints initiated on bilateral wrist. Charge RN made aware and witnessed ecchymosis on right wrist prior to application. Foam placed on left and right wrists. Will continue to monitor

## 2020-05-13 NOTE — Progress Notes (Addendum)
PROGRESS NOTE    Amy Riddle  BWG:[id number] DOB: 1945-12-08 DOA: 05/10/2020 PCP: Leonard Downing, MD    Brief Narrative:  Amy Riddle is a 74 y.o. female with medical history significant for Hx of intermittent asthma, HTN, permanent atrial fibrillation on Eliquis, Type 2 DM, and morbid obesity who presents with worsening shortness of breath.   Patient reports that she has been having ongoing shortness of breath that is worse this week.  Worse especially with exertion and better at rest.  Had to increase to sleeping on 2 pillows.  Has had wheezing, increased cough with sputum production.  No fever.  Also noted bilateral lower extremity edema.  She does not have a formal diagnosis of CHF.  She was evaluated by her PCP about 4 days ago and had increases to her Advair and increase of her Lasix from 40 mg to 60 mg.  However she did not feel like she had increased urine output from that.  Patient presented presented to ED on CPAP and was found to have O2 saturation in 70s.  Not able to wean down to a nonrebreather on 4 L.  Has leukocytosis of 13.1. Creatinine elevated to 2.69 from a prior of 0.70. Elevated bilirubin of 1.7. Anion gap of 18. BNP of 605.4.   CXR shows progressive right pleural effusion with increasing fluid and cardiomegaly with vascular congestion.  She was given Solu-Medrol, DuoNeb and 40 mg Lasix in the ED.   Consultants:   pccm  Procedures:  Antimicrobials:   ceftiaxone and azithromycin   Subjective: PCO2 elevated earlier.  Patient on BiPAP.  Confused.  Eyes closed.  Daughter at bedside   Objective: Vitals:   05/13/20 0913 05/13/20 1113 05/13/20 1146 05/13/20 1243  BP: 102/71 111/85  (!) 140/91  Pulse: (!) 103 74 (!) 106 (!) 109  Resp: 18 (!) 25 19 (!) 23  Temp: 97.8 F (36.6 C) 97.8 F (36.6 C)    TempSrc: Oral Oral    SpO2: 99% (!) 84% 96% 91%  Weight:      Height:        Intake/Output Summary (Last 24 hours) at 05/13/2020  1459 Last data filed at 05/13/2020 0900 Gross per 24 hour  Intake 620 ml  Output 1105 ml  Net -485 ml   Filed Weights   05/11/20 1734 05/12/20 0629 05/13/20 0416  Weight: 127.2 kg 126.2 kg 125.5 kg    Examination:  General exam: Appears calm and comfortable , mittens on, on bipap, daughter at bedside   respiratory system: anteriorly cta with poor resp effort, no wheezing Cardiovascular system: S1 & S2 heard, RRR. No JVD, murmurs, rubs, gallops or clicks.  Gastrointestinal system: Abdomen is nondistended, soft and nontender. Normal bowel sounds heard. Central nervous system:  difficult to assess Extremities: mild edema b/lr. Skin: Warm dry Psychiatry: confused, difficult to assess    Data Reviewed: I have personally reviewed following labs and imaging studies  CBC: Recent Labs  Lab 05/10/20 2010 05/12/20 0440  WBC 13.1* 9.9  NEUTROABS 10.3*  --   HGB 16.3* 15.2*  HCT 52.5* 51.0*  MCV 87.5 90.3  PLT 344 177   Basic Metabolic Panel: Recent Labs  Lab 05/10/20 2010 05/10/20 2045 05/11/20 0212 05/12/20 0440 05/13/20 0735  NA 137  --  136 137 140  K 4.7  --  4.5 4.5 5.2*  CL 92*  --  93* 95* 95*  CO2 27  --  29 32 35*  GLUCOSE 142*  --  175* 173* 176*  BUN 95*  --  93* 95* 97*  CREATININE 2.69*  --  2.56* 2.00* 1.78*  CALCIUM 9.2  --  9.0 9.1 9.6  MG  --  2.1  --  2.1 2.6*   GFR: Estimated Creatinine Clearance: 37.1 mL/min (A) (by C-G formula based on SCr of 1.78 mg/dL (H)). Liver Function Tests: Recent Labs  Lab 05/10/20 2010  AST 22  ALT 36  ALKPHOS 49  BILITOT 1.7*  PROT 6.5  ALBUMIN 3.7   No results for input(s): LIPASE, AMYLASE in the last 168 hours. No results for input(s): AMMONIA in the last 168 hours. Coagulation Profile: No results for input(s): INR, PROTIME in the last 168 hours. Cardiac Enzymes: No results for input(s): CKTOTAL, CKMB, CKMBINDEX, TROPONINI in the last 168 hours. BNP (last 3 results) No results for input(s): PROBNP in the  last 8760 hours. HbA1C: Recent Labs    05/11/20 0212  HGBA1C 6.7*   CBG: Recent Labs  Lab 05/12/20 1325 05/12/20 1700 05/12/20 2111 05/13/20 0607 05/13/20 1134  GLUCAP 192* 182* 210* 162* 161*   Lipid Profile: No results for input(s): CHOL, HDL, LDLCALC, TRIG, CHOLHDL, LDLDIRECT in the last 72 hours. Thyroid Function Tests: No results for input(s): TSH, T4TOTAL, FREET4, T3FREE, THYROIDAB in the last 72 hours. Anemia Panel: No results for input(s): VITAMINB12, FOLATE, FERRITIN, TIBC, IRON, RETICCTPCT in the last 72 hours. Sepsis Labs: No results for input(s): PROCALCITON, LATICACIDVEN in the last 168 hours.  Recent Results (from the past 240 hour(s))  SARS Coronavirus 2 by RT PCR (hospital order, performed in [facility] hospital lab) Nasopharyngeal Nasopharyngeal Swab     Status: None   Collection Time: 05/10/20  8:31 PM   Specimen: Nasopharyngeal Swab  Result Value Ref Range Status   SARS Coronavirus 2 NEGATIVE NEGATIVE Final    Comment: (NOTE) SARS-CoV-2 target nucleic acids are NOT DETECTED. The SARS-CoV-2 RNA is generally detectable in upper and lower respiratory specimens during the acute phase of infection. The lowest concentration of SARS-CoV-2 viral copies this assay can detect is 250 copies / mL. A negative result does not preclude SARS-CoV-2 infection and should not be used as the sole basis for treatment or other patient management decisions.  A negative result may occur with improper specimen collection / handling, submission of specimen other than nasopharyngeal swab, presence of viral mutation(s) within the areas targeted by this assay, and inadequate number of viral copies (<250 copies / mL). A negative result must be combined with clinical observations, patient history, and epidemiological information. Fact Sheet for Patients:   StrictlyIdeas.no Fact Sheet for Healthcare Providers: BankingDealers.co.za This  test is not yet approved or cleared  by the [place] FDA and has been authorized for detection and/or diagnosis of SARS-CoV-2 by FDA under an Emergency Use Authorization (EUA).  This EUA will remain in effect (meaning this test can be used) for the duration of the COVID-19 declaration under Section 564(b)(1) of the Act, 21 U.S.C. section 360bbb-3(b)(1), unless the authorization is terminated or revoked sooner. Performed at [facility], [place] [address] [place], [place] [address]   Culture, Urine     Status: Abnormal   Collection Time: 05/11/20  1:58 AM   Specimen: Urine, Catheterized  Result Value Ref Range Status   Specimen Description URINE, CATHETERIZED  Final   Special Requests ADDED  Final   Culture (A)  Final    >=100,000 COLONIES/mL GROUP B STREP(S.AGALACTIAE)ISOLATED TESTING AGAINST S. AGALACTIAE NOT ROUTINELY PERFORMED DUE  TO PREDICTABILITY OF AMP/PEN/VAN SUSCEPTIBILITY. Performed at [facility], [place] [address], [place], [place] [address]    Report Status 05/13/2020 FINAL  Final  Culture, blood (Routine X 2) w Reflex to ID Panel     Status: None (Preliminary result)   Collection Time: 05/11/20  3:20 PM   Specimen: BLOOD  Result Value Ref Range Status   Specimen Description BLOOD RIGHT ANTECUBITAL  Final   Special Requests   Final    BOTTLES DRAWN AEROBIC AND ANAEROBIC Blood Culture adequate volume   Culture   Final    NO GROWTH 2 DAYS Performed at [facility], [place] [address], [place], [place] [address]    Report Status PENDING  Incomplete  Culture, blood (Routine X 2) w Reflex to ID Panel     Status: None (Preliminary result)   Collection Time: 05/11/20  3:20 PM   Specimen: BLOOD RIGHT HAND  Result Value Ref Range Status   Specimen Description BLOOD RIGHT HAND  Final   Special Requests   Final    BOTTLES DRAWN AEROBIC ONLY Blood Culture adequate volume   Culture   Final    NO GROWTH 2 DAYS Performed at [facility], [place] [address], [place], [place] [address]    Report Status PENDING  Incomplete         Radiology Studies: DG Chest 1 View  Result Date: 05/12/2020 CLINICAL DATA:  Pleural effusion.  History of asthma EXAM: CHEST  1 VIEW COMPARISON:  05/10/2020 FINDINGS: Cardiopericardial enlargement and vascular pedicle widening. Pleural effusion on the right with pulmonary opacity/obscuration. No pneumothorax IMPRESSION: Unchanged cardiomegaly and right pleural effusion. Electronically Signed   By: Monte Fantasia M.D.   On: 05/12/2020 08:57   US RENAL  Result Date: 05/11/2020 CLINICAL DATA:  Acute renal insufficiency. EXAM: RENAL / URINARY TRACT ULTRASOUND COMPLETE COMPARISON:  None. FINDINGS: Right Kidney: Renal measurements: 11.2 cm x 6.3 cm x 6.6 cm = volume: 242.5 mL . Echogenicity within normal limits. No mass or hydronephrosis visualized. Left Kidney: Renal measurements: 11.9 cm x 6.2 cm x 6.4 cm = volume: 246.6 mL. Echogenicity within normal limits. No mass or hydronephrosis visualized. A small amount of perinephric fluid is seen on the left. Bladder: The urinary bladder is not clearly visualized. Other: None. IMPRESSION: Small amount of left-sided perinephric fluid. Electronically Signed   By: Virgina Norfolk M.D.   On: 05/11/2020 16:26   ECHOCARDIOGRAM COMPLETE  Result Date: 05/11/2020    ECHOCARDIOGRAM REPORT   Patient Name:   Amy Riddle Date of Exam: 05/11/2020 Medical Rec #:  [id number]            Height:       62.0 in Accession #:    [id number]           Weight:       284.0 lb Date of Birth:  [birth year]            BSA:          2.219 m Patient Age:    86 years             BP:           121/60 mmHg Patient Gender: F                    HR:           67 bpm. Exam Location:  Inpatient Procedure: 2D Echo, Cardiac Doppler and Color  Doppler Indications:    CHF-Acute Systolic 706.23 / J62.83  History:        Patient has prior history of Echocardiogram examinations, most                 recent 09/01/2019. CHF,  Arrythmias:Atrial Fibrillation; Risk                 Factors:Hypertension, Diabetes and Former Smoker.  Sonographer:    Vickie Epley RDCS Referring Phys: [id number] [place] T TU  Sonographer Comments: No subcostal window. IMPRESSIONS  1. Left ventricular ejection fraction, by estimation, is 40 to 45%. The left ventricle has mildly decreased function. The left ventricle demonstrates global hypokinesis. Left ventricular diastolic function could not be evaluated. There is the interventricular septum is flattened in systole, consistent with right ventricular pressure overload.  2. Right ventricular systolic function is moderately reduced. The right ventricular size is severely enlarged. Tricuspid regurgitation signal is inadequate for assessing PA pressure.  3. Left atrial size was mildly dilated.  4. Right atrial size was severely dilated.  5. The mitral valve is normal in structure. Trivial mitral valve regurgitation.  6. The aortic valve was not well visualized. Aortic valve regurgitation is not visualized. No aortic stenosis is present. Conclusion(s)/Recommendation(s): LV EF reduced, but RV size/function is severely abnormal. TR ject inadequate for assessing PA pressure, and IVC not well seen. On direct comparison to prior, RV size/function appeared mildly abnormal before, but this is a  significant change. Concerning for RV pressure overload. No definitive McConnell's sign but appears consistent with pulmonary hypertension or PE. FINDINGS  Left Ventricle: Left ventricular ejection fraction, by estimation, is 40 to 45%. The left ventricle has mildly decreased function. The left ventricle demonstrates global hypokinesis. The left ventricular internal cavity size was normal in size. There is  no left ventricular hypertrophy. The interventricular septum is flattened in systole, consistent with right ventricular pressure overload. Left ventricular diastolic function could not be evaluated due to atrial fibrillation. Left  ventricular diastolic function could not be evaluated. Right Ventricle: The right ventricular size is severely enlarged. No increase in right ventricular wall thickness. Right ventricular systolic function is moderately reduced. Tricuspid regurgitation signal is inadequate for assessing PA pressure. Left Atrium: Left atrial size was mildly dilated. Right Atrium: Right atrial size was severely dilated. Pericardium: A small pericardial effusion is present. Mitral Valve: The mitral valve is normal in structure. Trivial mitral valve regurgitation. Tricuspid Valve: The tricuspid valve is normal in structure. Tricuspid valve regurgitation is trivial. No evidence of tricuspid stenosis. Aortic Valve: The aortic valve was not well visualized. . There is mild thickening and mild calcification of the aortic valve. Aortic valve regurgitation is not visualized. No aortic stenosis is present. Mild aortic valve annular calcification. There is mild thickening of the aortic valve. There is mild calcification of the aortic valve. Pulmonic Valve: The pulmonic valve was not well visualized. Pulmonic valve regurgitation is not visualized. No evidence of pulmonic stenosis. Aorta: The aortic root is normal in size and structure. Venous: The inferior vena cava was not well visualized. IAS/Shunts: The atrial septum is grossly normal.  LEFT VENTRICLE PLAX 2D LVOT diam:     2.50 cm LV SV:         50 LV SV Index:   22 LVOT Area:     4.91 cm  LV Volumes (MOD) LV vol d, MOD A2C: 67.3 ml LV vol d, MOD A4C: 69.8 ml LV vol s, MOD A2C: 38.7 ml LV vol  s, MOD A4C: 44.3 ml LV SV MOD A2C:     28.6 ml LV SV MOD A4C:     69.8 ml LV SV MOD BP:      27.4 ml RIGHT VENTRICLE TAPSE (M-mode): 1.1 cm LEFT ATRIUM             Index       RIGHT ATRIUM           Index LA Vol (A2C):   46.2 ml 20.80 ml/m RA Area:     27.00 cm LA Vol (A4C):   54.5 ml 24.56 ml/m RA Volume:   97.10 ml  43.76 ml/m LA Biplane Vol: 54.7 ml 24.65 ml/m  AORTIC VALVE LVOT Vmax:   73.20  cm/s LVOT Vmean:  45.600 cm/s LVOT VTI:    0.101 m  AORTA Ao Root diam: 3.40 cm  SHUNTS Systemic VTI:  0.10 m Systemic Diam: 2.50 cm Buford Dresser MD Electronically signed by Buford Dresser MD Signature Date/Time: 05/11/2020/9:52:49 PM    Final    VAS [place] LOWER EXTREMITY VENOUS (DVT)  Result Date: 05/12/2020  Lower Venous DVTStudy Indications: Swelling.  Risk Factors: None identified. Limitations: Body habitus, poor ultrasound/tissue interface and patient positioning, patient movement. Comparison Study: No prior studies. Performing Technologist: Oliver Hum RVT  Examination Guidelines: A complete evaluation includes B-mode imaging, spectral Doppler, color Doppler, and power Doppler as needed of all accessible portions of each vessel. Bilateral testing is considered an integral part of a complete examination. Limited examinations for reoccurring indications may be performed as noted. The reflux portion of the exam is performed with the patient in reverse Trendelenburg.  +---------+---------------+---------+-----------+----------+--------------+ RIGHT    CompressibilityPhasicitySpontaneityPropertiesThrombus Aging +---------+---------------+---------+-----------+----------+--------------+ CFV      Full           Yes      Yes                                 +---------+---------------+---------+-----------+----------+--------------+ SFJ      Full                                                        +---------+---------------+---------+-----------+----------+--------------+ FV Prox  Full                                                        +---------+---------------+---------+-----------+----------+--------------+ FV Mid   Full                                                        +---------+---------------+---------+-----------+----------+--------------+ FV DistalFull                                                         +---------+---------------+---------+-----------+----------+--------------+ PFV      Full                                                        +---------+---------------+---------+-----------+----------+--------------+  POP      Full           Yes      Yes                                 +---------+---------------+---------+-----------+----------+--------------+ PTV      Full                                                        +---------+---------------+---------+-----------+----------+--------------+ PERO     Full                                                        +---------+---------------+---------+-----------+----------+--------------+   +---------+---------------+---------+-----------+----------+--------------+ LEFT     CompressibilityPhasicitySpontaneityPropertiesThrombus Aging +---------+---------------+---------+-----------+----------+--------------+ CFV      Full           Yes      Yes                                 +---------+---------------+---------+-----------+----------+--------------+ SFJ      Full                                                        +---------+---------------+---------+-----------+----------+--------------+ FV Prox  Full                                                        +---------+---------------+---------+-----------+----------+--------------+ FV Mid   Full                                                        +---------+---------------+---------+-----------+----------+--------------+ FV DistalFull                                                        +---------+---------------+---------+-----------+----------+--------------+ PFV      Full                                                        +---------+---------------+---------+-----------+----------+--------------+ POP      Full           Yes      Yes                                  +---------+---------------+---------+-----------+----------+--------------+  PTV      Full                                                        +---------+---------------+---------+-----------+----------+--------------+ PERO     Full                                                        +---------+---------------+---------+-----------+----------+--------------+     Summary: RIGHT: - There is no evidence of deep vein thrombosis in the lower extremity.  - No cystic structure found in the popliteal fossa.  LEFT: - There is no evidence of deep vein thrombosis in the lower extremity.  - No cystic structure found in the popliteal fossa.  *See table(s) above for measurements and observations. Electronically signed by Servando Snare MD on 05/12/2020 at 4:58:25 PM.    Final    ECHOCARDIOGRAM LIMITED  Result Date: 05/12/2020    ECHOCARDIOGRAM LIMITED REPORT   Patient Name:   Amy Riddle Date of Exam: 05/12/2020 Medical Rec #:  [id number]            Height:       62.5 in Accession #:    [id number]           Weight:       278.2 lb Date of Birth:  [birth year]            BSA:          2.213 m Patient Age:    62 years             BP:           104/78 mmHg Patient Gender: F                    HR:           110 bpm. Exam Location:  Inpatient Procedure: Limited Color Doppler, Limited Echo and Cardiac Doppler Indications:    acute respiratory insufficiency 518.82  History:        Patient has prior history of Echocardiogram examinations, most                 recent 05/11/2020. CHF, Arrythmias:Atrial Fibrillation,                 Signs/Symptoms:Shortness of Breath; Risk Factors:Diabetes.  Sonographer:    Johny Chess Referring Phys: [id number] [place]  1. Left ventricular ejection fraction, by estimation, is 40 to 45%. The left ventricle has mildly decreased function. The left ventricle demonstrates global hypokinesis. There is mild concentric left ventricular hypertrophy. Left ventricular  diastolic function could not be evaluated. There is the interventricular septum is flattened in diastole ('D' shaped left ventricle), consistent with right ventricular volume overload.  2. Right ventricular systolic function is mildly reduced. The right ventricular size is moderately enlarged. There is moderately elevated pulmonary artery systolic pressure.  3. Right atrial size was moderately dilated.  4. The aortic valve is tricuspid.  5. The inferior vena cava is dilated in size with <50% respiratory variability, suggesting right atrial pressure of 15 mmHg. FINDINGS  Left Ventricle: Left ventricular ejection fraction, by estimation, is  40 to 45%. The left ventricle has mildly decreased function. The left ventricle demonstrates global hypokinesis. There is mild concentric left ventricular hypertrophy. The interventricular septum is flattened in diastole ('D' shaped left ventricle), consistent with right ventricular volume overload. Right Ventricle: The right ventricular size is moderately enlarged. Right ventricular systolic function is mildly reduced. There is moderately elevated pulmonary artery systolic pressure. The tricuspid regurgitant velocity is 3.05 m/s, and with an assumed right atrial pressure of 15 mmHg, the estimated right ventricular systolic pressure is 61.6 mmHg. Right Atrium: Right atrial size was moderately dilated. Pericardium: Trivial pericardial effusion is present. Tricuspid Valve: Tricuspid valve regurgitation is trivial. Aortic Valve: The aortic valve is tricuspid. Venous: The inferior vena cava is dilated in size with less than 50% respiratory variability, suggesting right atrial pressure of 15 mmHg.  LEFT VENTRICLE PLAX 2D LVIDd:         5.10 cm LVIDs:         4.00 cm LV PW:         1.10 cm LV IVS:        1.10 cm LVOT diam:     2.30 cm LVOT Area:     4.15 cm  IVC IVC diam: 3.20 cm LEFT ATRIUM         Index      RIGHT ATRIUM           Index LA diam:    3.80 cm 1.72 cm/m RA Area:      23.80 cm                                RA Volume:   73.10 ml  33.04 ml/m   AORTA Ao Root diam: 3.20 cm TRICUSPID VALVE TR Peak grad:   37.2 mmHg TR Vmax:        305.00 cm/s  SHUNTS Systemic Diam: 2.30 cm Skeet Latch MD Electronically signed by Skeet Latch MD Signature Date/Time: 05/12/2020/1:38:53 PM    Final    US Abdomen Limited RUQ  Result Date: 05/13/2020 CLINICAL DATA:  Cirrhosis EXAM: ULTRASOUND ABDOMEN LIMITED RIGHT UPPER QUADRANT COMPARISON:  07/05/2005 abdominal CT FINDINGS: Gallbladder: Partial distension of the gallbladder. No gallstones or wall thickening visualized. No sonographic Murphy sign noted by sonographer. Common bile duct: Diameter: 4 mm Liver: Lobulated liver surface and large central fissures, correlating with history of cirrhosis. No evidence of mass lesion. Portal vein is patent on color Doppler imaging with normal direction of blood flow towards the liver. Other: Right pleural effusion that is known from chest x-ray yesterday. IMPRESSION: 1. Cirrhosis without ascites or visible mass. 2. Right pleural effusion. Electronically Signed   By: Monte Fantasia M.D.   On: 05/13/2020 11:38        Scheduled Meds: . apixaban  5 mg Oral BID  . arformoterol  15 mcg Nebulization BID  . budesonide (PULMICORT) nebulizer solution  0.5 mg Nebulization BID  . chlorhexidine  15 mL Mouth Rinse BID  . Chlorhexidine Gluconate Cloth  6 each Topical Daily  . diltiazem  360 mg Oral Daily  . feeding supplement (ENSURE ENLIVE)  237 mL Oral TID BM  . insulin aspart  0-9 Units Subcutaneous TID WC  . mouth rinse  15 mL Mouth Rinse q12n4p  . multivitamin with minerals  1 tablet Oral Daily  . sodium chloride flush  3 mL Intravenous Q12H   Continuous Infusions: . sodium chloride    .  azithromycin 500 mg (05/12/20 1915)  . cefTRIAXone (ROCEPHIN)  IV 1 g (05/12/20 1836)    Assessment & Plan:   Principal Problem:   Acute CHF (congestive heart failure) (HCC) Active Problems:    Diabetes (HCC)   Hypotension   AKI (acute kidney injury) ([place])   Permanent atrial fibrillation (HCC)   Cirrhosis ([place])   Acute hypoxemic and hypercapnic respiratory failure -multifactoria. CHF and possible asthma exacerbation Last Echo is 45-50% in 08/2019.  Will repeat. Strict I's and O's.  Daily weights PRN Xopenex Pulmonary consulted, input was appreciated-held Lasix.  Started on IV steroids...>now discontinued as it may cause delirium -wean O2 for sats 90-95% -brovana + pulmicort BID -PRN xopenex  IV antibiotics empirically Rt pleural effusion may need ?thoracentesis . ABG elevated PCO2 , on Bipap (changes made by pccm) continue BiPAP QHS and PRN daytim-e sleep  EchO:EF 40-45%, rv ef mildly reduced, rv volume overload, rv enlarged, elevated PASP LE venous US: negative dvt Plan: vq scan pending Lasix prn   Hypotension Somewhat improved without intervention.  We will hold antihypertensive  AKI Likely from vascular congestion. Received lasix iv. Monitor closely, if no improvement will consult nephrology renal US: Small amount of left-sided perinephric fluid.   Permanent atrial fibrillation Nonsustained RVR , worsened by respiratory issues Continue diltiazem but will hold losartan due to AKI and some hypotension Continue Eliquis  Non-insulin-dependent type 2 diabetes Low-dose sliding scale   DVT prophylaxis: Eliquis Code Status: DNR Family Communication: spoke to daughter and husband Disposition Plan: Back to Home Inpatient status Remains inpatient appropriate because:Inpatient level of care appropriate due to severity of illness  Dispo: The patient is from: Home  Anticipated d/c is to: Home  Anticipated d/c date is: > 3 days  Patient currently is not medically stable to d/c.On bipap.     LOS: 3 days   Time spent: 45 min with more than >50% on coc    Nolberto Hanlon, MD [facility] Pager [phone]  If  7PM-7AM, please contact night-coverage [email or web address] Password [facility] 05/13/2020, 2:59 PM Patient ID: Amy Riddle, female   DOB: [birth year], 74 y.o.   MRN: [id number]

## 2020-05-13 NOTE — Progress Notes (Addendum)
Upon entering patient room, patient has pulled high flow nasal canula off and oxygen saturation was at 76%. BiPAP was applied as well as safety mitts. Patient was educated on importance and purpose of BiPAP, verbalizes understanding and compliance.  Verbal orders received from Kurtis Bushman, MD to renew safety sitter orders. Orders implemented by RN.

## 2020-05-13 NOTE — Progress Notes (Signed)
   05/13/20 0913  Assess: MEWS Score  Temp 97.8 F (36.6 C)  BP 102/71  Pulse Rate (!) 103  Resp 18  Level of Consciousness Responds to Voice  SpO2 99 %  O2 Device Bi-PAP  Assess: MEWS Score  MEWS Temp 0  MEWS Systolic 0  MEWS Pulse 1  MEWS RR 0  MEWS LOC 1  MEWS Score 2  MEWS Score Color Yellow  Assess: if the MEWS score is Yellow or Red  Were vital signs taken at a resting state? Yes  Focused Assessment Documented focused assessment  Early Detection of Sepsis Score *See Row Information* Medium  MEWS guidelines implemented *See Row Information* Yes  Treat  MEWS Interventions Escalated (See documentation below)  Take Vital Signs  Increase Vital Sign Frequency  Yellow: Q 2hr X 2 then Q 4hr X 2, if remains yellow, continue Q 4hrs  Escalate  MEWS: Escalate Yellow: discuss with charge nurse/RN and consider discussing with provider and RRT  Notify: Charge Nurse/RN  Name of Charge Nurse/RN Notified Eun, RN  Date Charge Nurse/RN Notified 05/13/20  Time Charge Nurse/RN Notified 0930  Notify: Provider  Provider Name/Title Amery, MD  Date Provider Notified 05/13/20  Time Provider Notified [phone]  Notification Type Page  Notification Reason Change in status  Response No new orders  Date of Provider Response 05/13/20  Time of Provider Response [phone]  Notify: Rapid Response  Name of Rapid Response RN Notified  (none)  Document  Patient Outcome Other (Comment) (Pt stable, continue to monitor)  Progress note created (see row info) Yes

## 2020-05-13 NOTE — Progress Notes (Signed)
RT placed patient on BIPAP with previous settings due to decreased LOC and lethargy. Patient very hard to arouse and when asked where she is, patient states she is at home. RN made aware. Patient is tolerating well at this time. RT will continue to monitor.

## 2020-05-14 ENCOUNTER — Inpatient Hospital Stay (HOSPITAL_COMMUNITY): Payer: Medicare Other

## 2020-05-14 DIAGNOSIS — J9622 Acute and chronic respiratory failure with hypercapnia: Secondary | ICD-10-CM

## 2020-05-14 DIAGNOSIS — J9621 Acute and chronic respiratory failure with hypoxia: Secondary | ICD-10-CM

## 2020-05-14 LAB — BASIC METABOLIC PANEL
Anion gap: 9 (ref 5–15)
BUN: 81 mg/dL — ABNORMAL HIGH (ref 8–23)
CO2: 36 mmol/L — ABNORMAL HIGH (ref 22–32)
Calcium: 9.9 mg/dL (ref 8.9–10.3)
Chloride: 99 mmol/L (ref 98–111)
Creatinine, Ser: 1.41 mg/dL — ABNORMAL HIGH (ref 0.44–1.00)
GFR calc Af Amer: 43 mL/min — ABNORMAL LOW (ref >60)
GFR calc non Af Amer: 37 mL/min — ABNORMAL LOW (ref >60)
Glucose, Bld: 161 mg/dL — ABNORMAL HIGH (ref 70–99)
Potassium: 5.3 mmol/L — ABNORMAL HIGH (ref 3.5–5.1)
Sodium: 144 mmol/L (ref 135–145)

## 2020-05-14 LAB — GLUCOSE, CAPILLARY
Glucose-Capillary: 166 mg/dL — ABNORMAL HIGH (ref 70–99)
Glucose-Capillary: 189 mg/dL — ABNORMAL HIGH (ref 70–99)
Glucose-Capillary: 185 mg/dL — ABNORMAL HIGH (ref 70–99)
Glucose-Capillary: 186 mg/dL — ABNORMAL HIGH (ref 70–99)

## 2020-05-14 LAB — MAGNESIUM: Magnesium: 2.8 mg/dL — ABNORMAL HIGH (ref 1.7–2.4)

## 2020-05-14 LAB — PHOSPHORUS: Phosphorus: 2.9 mg/dL (ref 2.5–4.6)

## 2020-05-14 MED ORDER — SODIUM POLYSTYRENE SULFONATE 15 GM/60ML PO SUSP
30.0000 g | Freq: Once | ORAL | Status: AC
  Administered 2020-05-14: 30 g via ORAL
  Filled 2020-05-14: qty 120

## 2020-05-14 MED ORDER — METOPROLOL TARTRATE 12.5 MG HALF TABLET
12.5000 mg | ORAL_TABLET | Freq: Four times a day (QID) | ORAL | Status: DC
  Administered 2020-05-14 – 2020-05-20 (×23): 12.5 mg via ORAL
  Filled 2020-05-14 (×23): qty 1

## 2020-05-14 NOTE — Progress Notes (Signed)
   05/14/20 0504  Assess: MEWS Score  Temp 98.2 F (36.8 C)  BP 101/70  Pulse Rate (!) 114  ECG Heart Rate (!) 116  Resp 19  SpO2 93 %  Assess: MEWS Score  MEWS Temp 0  MEWS Systolic 0  MEWS Pulse 2  MEWS RR 0  MEWS LOC 0  MEWS Score 2  MEWS Score Color Yellow  Assess: if the MEWS score is Yellow or Red  Were vital signs taken at a resting state? Yes  Focused Assessment Documented focused assessment  Early Detection of Sepsis Score *See Row Information* Low  MEWS guidelines implemented *See Row Information* Yes  Treat  MEWS Interventions Escalated (See documentation below)  Take Vital Signs  Increase Vital Sign Frequency  Yellow: Q 2hr X 2 then Q 4hr X 2, if remains yellow, continue Q 4hrs  Escalate  MEWS: Escalate Yellow: discuss with charge nurse/RN and consider discussing with provider and RRT  Notify: Charge Nurse/RN  Name of Charge Nurse/RN Notified Danae Chen RN  Date Charge Nurse/RN Notified 05/13/20  Time Charge Nurse/RN Notified 0647   Pt asymptomatic. Will relay to day team. Pt resting in the bed on Bipap.

## 2020-05-14 NOTE — Progress Notes (Signed)
   05/14/20 0800  Assess: MEWS Score  Temp 97.7 F (36.5 C)  BP 131/90  Pulse Rate (!) 104  ECG Heart Rate (!) 119  Resp 17  Level of Consciousness Alert  SpO2 94 %  O2 Device HFNC  O2 Flow Rate (L/min) 6 L/min  Assess: MEWS Score  MEWS Temp 0  MEWS Systolic 0  MEWS Pulse 2  MEWS RR 0  MEWS LOC 0  MEWS Score 2  MEWS Score Color Yellow  Assess: if the MEWS score is Yellow or Red  Were vital signs taken at a resting state? Yes  Focused Assessment Documented focused assessment  Early Detection of Sepsis Score *See Row Information* Low  MEWS guidelines implemented *See Row Information* Yes  Treat  MEWS Interventions Administered scheduled meds/treatments  Take Vital Signs  Increase Vital Sign Frequency  Yellow: Q 2hr X 2 then Q 4hr X 2, if remains yellow, continue Q 4hrs  Escalate  MEWS: Escalate Yellow: discuss with charge nurse/RN and consider discussing with provider and RRT  Notify: Provider  Provider Name/Title  Kurtis Bushman)  Date Provider Notified 05/14/20  Time Provider Notified [phone]  Notification Type Page  Notification Reason Other (Comment)  Response  (yellow mews and HR)  Date of Provider Response 05/14/20  Time of Provider Response [phone]  Document  Patient Outcome Stabilized after interventions  Progress note created (see row info) Yes

## 2020-05-14 NOTE — Progress Notes (Signed)
Pt was placed on qhs bipap, restraints removed prior to placing pt on bipap, this was discussed w/ RN prior.  No distress noted currently, pt appears to currently be tol bipap well.  Sat 92-93%, HR 99

## 2020-05-14 NOTE — Progress Notes (Signed)
PT Cancellation Note  Patient Details Name: Amy Riddle MRN: [id number] DOB: 20-Dec-1945   Cancelled Treatment:    Reason Eval/Treat Not Completed: Medical issues which prohibited therapy Spoke with RN. Pt somnolent, in restraints, and attempting to keep BiPAP on. Requests return at another time. Will check back Sunday or Monday for comprehensive PT evaluation as appropriate.  Ellouise Newer 05/14/2020, 4:34 PM

## 2020-05-14 NOTE — Progress Notes (Signed)
PROGRESS NOTE    Amy Riddle  XHB:[id number] DOB: Feb 16, 1946 DOA: 05/10/2020 PCP: Leonard Downing, MD    Brief Narrative:  Amy Riddle is a 74 y.o. female with medical history significant for Hx of intermittent asthma, HTN, permanent atrial fibrillation on Eliquis, Type 2 DM, and morbid obesity who presents with worsening shortness of breath.   Patient reports that she has been having ongoing shortness of breath that is worse this week.  Worse especially with exertion and better at rest.  Had to increase to sleeping on 2 pillows.  Has had wheezing, increased cough with sputum production.  No fever.  Also noted bilateral lower extremity edema.  She does not have a formal diagnosis of CHF.  She was evaluated by her PCP about 4 days ago and had increases to her Advair and increase of her Lasix from 40 mg to 60 mg.  However she did not feel like she had increased urine output from that.  Patient presented presented to ED on CPAP and was found to have O2 saturation in 70s.  Not able to wean down to a nonrebreather on 4 L.  Has leukocytosis of 13.1. Creatinine elevated to 2.69 from a prior of 0.70. Elevated bilirubin of 1.7. Anion gap of 18. BNP of 605.4.   CXR shows progressive right pleural effusion with increasing fluid and cardiomegaly with vascular congestion.  She was given Solu-Medrol, DuoNeb and 40 mg Lasix in the ED.   Consultants:   pccm  Procedures:  Antimicrobials:   ceftiaxone and azithromycin   Subjective: Awake and alert this am. Sitting in bed on [place]. Only oriented to  person. Trying to get out of bed. Denies cp, dizziness, or even sob, but appears mildly pursing lips while talking   Objective: Vitals:   05/14/20 0504 05/14/20 0612 05/14/20 0738 05/14/20 0800  BP: 101/70   131/90  Pulse: (!) 114   (!) 104  Resp: 19   17  Temp: 98.2 F (36.8 C)   97.7 F (36.5 C)  TempSrc: Oral   Oral  SpO2: 93%  96% 94%  Weight:  124.7 kg    Height:         Intake/Output Summary (Last 24 hours) at 05/14/2020 0807 Last data filed at 05/14/2020 0612 Gross per 24 hour  Intake 280 ml  Output 1800 ml  Net -1520 ml   Filed Weights   05/12/20 0629 05/13/20 0416 05/14/20 0612  Weight: 126.2 kg 125.5 kg 124.7 kg    Examination:  General exam: comfortable , more interactive, still confused   respiratory system: cta, no w/r/r  Cardiovascular system: S1 & S2 heard, RRR. No JVD, murmurs, rubs, gallops or clicks.  Gastrointestinal system: Abdomen is nondistended, soft and nontender. Normal bowel sounds heard. Central nervous system:  Awake alert, oriented to person only Extremities: trace edema b/lr. Skin: Warm dry Psychiatry: confused, difficult to assess    Data Reviewed: I have personally reviewed following labs and imaging studies  CBC: Recent Labs  Lab 05/10/20 2010 05/12/20 0440  WBC 13.1* 9.9  NEUTROABS 10.3*  --   HGB 16.3* 15.2*  HCT 52.5* 51.0*  MCV 87.5 90.3  PLT 344 810   Basic Metabolic Panel: Recent Labs  Lab 05/10/20 2010 05/10/20 2045 05/11/20 0212 05/12/20 0440 05/13/20 0735 05/14/20 0451  NA 137  --  136 137 140 144  K 4.7  --  4.5 4.5 5.2* 5.3*  CL 92*  --  93* 95* 95* 99  CO2 27  --  29 32 35* 36*  GLUCOSE 142*  --  175* 173* 176* 161*  BUN 95*  --  93* 95* 97* 81*  CREATININE 2.69*  --  2.56* 2.00* 1.78* 1.41*  CALCIUM 9.2  --  9.0 9.1 9.6 9.9  MG  --  2.1  --  2.1 2.6* 2.8*  PHOS  --   --   --   --   --  2.9   GFR: Estimated Creatinine Clearance: 46.6 mL/min (A) (by C-G formula based on SCr of 1.41 mg/dL (H)). Liver Function Tests: Recent Labs  Lab 05/10/20 2010  AST 22  ALT 36  ALKPHOS 49  BILITOT 1.7*  PROT 6.5  ALBUMIN 3.7   No results for input(s): LIPASE, AMYLASE in the last 168 hours. No results for input(s): AMMONIA in the last 168 hours. Coagulation Profile: No results for input(s): INR, PROTIME in the last 168 hours. Cardiac Enzymes: No results for input(s): CKTOTAL,  CKMB, CKMBINDEX, TROPONINI in the last 168 hours. BNP (last 3 results) No results for input(s): PROBNP in the last 8760 hours. HbA1C: No results for input(s): HGBA1C in the last 72 hours. CBG: Recent Labs  Lab 05/13/20 0607 05/13/20 1134 05/13/20 1637 05/13/20 2309 05/14/20 0617  GLUCAP 162* 161* 124* 150* 166*   Lipid Profile: No results for input(s): CHOL, HDL, LDLCALC, TRIG, CHOLHDL, LDLDIRECT in the last 72 hours. Thyroid Function Tests: No results for input(s): TSH, T4TOTAL, FREET4, T3FREE, THYROIDAB in the last 72 hours. Anemia Panel: No results for input(s): VITAMINB12, FOLATE, FERRITIN, TIBC, IRON, RETICCTPCT in the last 72 hours. Sepsis Labs: No results for input(s): PROCALCITON, LATICACIDVEN in the last 168 hours.  Recent Results (from the past 240 hour(s))  SARS Coronavirus 2 by RT PCR (hospital order, performed in [facility] hospital lab) Nasopharyngeal Nasopharyngeal Swab     Status: None   Collection Time: 05/10/20  8:31 PM   Specimen: Nasopharyngeal Swab  Result Value Ref Range Status   SARS Coronavirus 2 NEGATIVE NEGATIVE Final    Comment: (NOTE) SARS-CoV-2 target nucleic acids are NOT DETECTED. The SARS-CoV-2 RNA is generally detectable in upper and lower respiratory specimens during the acute phase of infection. The lowest concentration of SARS-CoV-2 viral copies this assay can detect is 250 copies / mL. A negative result does not preclude SARS-CoV-2 infection and should not be used as the sole basis for treatment or other patient management decisions.  A negative result may occur with improper specimen collection / handling, submission of specimen other than nasopharyngeal swab, presence of viral mutation(s) within the areas targeted by this assay, and inadequate number of viral copies (<250 copies / mL). A negative result must be combined with clinical observations, patient history, and epidemiological information. Fact Sheet for Patients:     StrictlyIdeas.no Fact Sheet for Healthcare Providers: BankingDealers.co.za This test is not yet approved or cleared  by the [place] FDA and has been authorized for detection and/or diagnosis of SARS-CoV-2 by FDA under an Emergency Use Authorization (EUA).  This EUA will remain in effect (meaning this test can be used) for the duration of the COVID-19 declaration under Section 564(b)(1) of the Act, 21 U.S.C. section 360bbb-3(b)(1), unless the authorization is terminated or revoked sooner. Performed at [facility], [place] [address] [place], [place] [address]   Culture, Urine     Status: Abnormal   Collection Time: 05/11/20  1:58 AM   Specimen: Urine, Catheterized  Result Value Ref Range Status  Specimen Description URINE, CATHETERIZED  Final   Special Requests ADDED  Final   Culture (A)  Final    >=100,000 COLONIES/mL GROUP B STREP(S.AGALACTIAE)ISOLATED TESTING AGAINST S. AGALACTIAE NOT ROUTINELY PERFORMED DUE TO PREDICTABILITY OF AMP/PEN/VAN SUSCEPTIBILITY. Performed at [facility], [place] [address], [place], [place] [address]    Report Status 05/13/2020 FINAL  Final  Culture, blood (Routine X 2) w Reflex to ID Panel     Status: None (Preliminary result)   Collection Time: 05/11/20  3:20 PM   Specimen: BLOOD  Result Value Ref Range Status   Specimen Description BLOOD RIGHT ANTECUBITAL  Final   Special Requests   Final    BOTTLES DRAWN AEROBIC AND ANAEROBIC Blood Culture adequate volume   Culture   Final    NO GROWTH 3 DAYS Performed at [facility], [place] [address] [place], [place] [address]    Report Status PENDING  Incomplete  Culture, blood (Routine X 2) w Reflex to ID Panel     Status: None (Preliminary result)   Collection Time: 05/11/20  3:20 PM   Specimen: BLOOD RIGHT HAND  Result Value Ref Range Status   Specimen Description BLOOD RIGHT HAND  Final   Special Requests   Final    BOTTLES DRAWN  AEROBIC ONLY Blood Culture adequate volume   Culture   Final    NO GROWTH 3 DAYS Performed at [facility], [place] [address] [place],  [address]    Report Status PENDING  Incomplete         Radiology Studies: NM Pulmonary Perfusion  Result Date: 05/13/2020 CLINICAL DATA:  Hypoxia.  Shortness of breath. EXAM: NUCLEAR MEDICINE PERFUSION LUNG SCAN TECHNIQUE: Perfusion images were obtained in multiple projections after intravenous injection of radiopharmaceutical. Ventilation scans not performed, patient with altered mental status and had difficulty tolerating the exam, terminated the exam after perfusion imaging. RADIOPHARMACEUTICALS:  1.6 mCi Tc-85m MAA IV COMPARISON:  Chest radiograph 05/12/2020 FINDINGS: An oblique area of linear photopenia in the right lung parallels the fissure and likely corresponds to fluid in the fissure as seen on radiograph. Photopenia at the right costophrenic angle related to pleural effusion. There is otherwise homogeneous distribution of radiotracer throughout the lungs. No peripheral or wedge-shaped perfusion defects. IMPRESSION: 1. Low probability for pulmonary embolus. 2. Right pleural effusion with fluid in the fissure. Electronically Signed   By: Keith Rake M.D.   On: 05/13/2020 17:01   VAS [place] LOWER EXTREMITY VENOUS (DVT)  Result Date: 05/12/2020  Lower Venous DVTStudy Indications: Swelling.  Risk Factors: None identified. Limitations: Body habitus, poor ultrasound/tissue interface and patient positioning, patient movement. Comparison Study: No prior studies. Performing Technologist: Oliver Hum RVT  Examination Guidelines: A complete evaluation includes B-mode imaging, spectral Doppler, color Doppler, and power Doppler as needed of all accessible portions of each vessel. Bilateral testing is considered an integral part of a complete examination. Limited examinations for reoccurring indications may be performed as noted. The reflux portion of  the exam is performed with the patient in reverse Trendelenburg.  +---------+---------------+---------+-----------+----------+--------------+ RIGHT    CompressibilityPhasicitySpontaneityPropertiesThrombus Aging +---------+---------------+---------+-----------+----------+--------------+ CFV      Full           Yes      Yes                                 +---------+---------------+---------+-----------+----------+--------------+ SFJ      Full                                                        +---------+---------------+---------+-----------+----------+--------------+  FV Prox  Full                                                        +---------+---------------+---------+-----------+----------+--------------+ FV Mid   Full                                                        +---------+---------------+---------+-----------+----------+--------------+ FV DistalFull                                                        +---------+---------------+---------+-----------+----------+--------------+ PFV      Full                                                        +---------+---------------+---------+-----------+----------+--------------+ POP      Full           Yes      Yes                                 +---------+---------------+---------+-----------+----------+--------------+ PTV      Full                                                        +---------+---------------+---------+-----------+----------+--------------+ PERO     Full                                                        +---------+---------------+---------+-----------+----------+--------------+   +---------+---------------+---------+-----------+----------+--------------+ LEFT     CompressibilityPhasicitySpontaneityPropertiesThrombus Aging +---------+---------------+---------+-----------+----------+--------------+ CFV      Full           Yes      Yes                                  +---------+---------------+---------+-----------+----------+--------------+ SFJ      Full                                                        +---------+---------------+---------+-----------+----------+--------------+ FV Prox  Full                                                        +---------+---------------+---------+-----------+----------+--------------+  FV Mid   Full                                                        +---------+---------------+---------+-----------+----------+--------------+ FV DistalFull                                                        +---------+---------------+---------+-----------+----------+--------------+ PFV      Full                                                        +---------+---------------+---------+-----------+----------+--------------+ POP      Full           Yes      Yes                                 +---------+---------------+---------+-----------+----------+--------------+ PTV      Full                                                        +---------+---------------+---------+-----------+----------+--------------+ PERO     Full                                                        +---------+---------------+---------+-----------+----------+--------------+     Summary: RIGHT: - There is no evidence of deep vein thrombosis in the lower extremity.  - No cystic structure found in the popliteal fossa.  LEFT: - There is no evidence of deep vein thrombosis in the lower extremity.  - No cystic structure found in the popliteal fossa.  *See table(s) above for measurements and observations. Electronically signed by Amy Snare MD on 05/12/2020 at 4:58:25 PM.    Final    ECHOCARDIOGRAM LIMITED  Result Date: 05/12/2020    ECHOCARDIOGRAM LIMITED REPORT   Patient Name:   Amy Riddle Date of Exam: 05/12/2020 Medical Rec #:  [id number]            Height:       62.5 in Accession #:     [id number]           Weight:       278.2 lb Date of Birth:  01-Nov-1946            BSA:          2.213 m Patient Age:    42 years             BP:           104/78 mmHg Patient Gender: F  HR:           110 bpm. Exam Location:  Inpatient Procedure: Limited Color Doppler, Limited Echo and Cardiac Doppler Indications:    acute respiratory insufficiency 518.82  History:        Patient has prior history of Echocardiogram examinations, most                 recent 05/11/2020. CHF, Arrythmias:Atrial Fibrillation,                 Signs/Symptoms:Shortness of Breath; Risk Factors:Diabetes.  Sonographer:    Amy Riddle Referring Phys: [id number] Driscoll  1. Left ventricular ejection fraction, by estimation, is 40 to 45%. The left ventricle has mildly decreased function. The left ventricle demonstrates global hypokinesis. There is mild concentric left ventricular hypertrophy. Left ventricular diastolic function could not be evaluated. There is the interventricular septum is flattened in diastole ('D' shaped left ventricle), consistent with right ventricular volume overload.  2. Right ventricular systolic function is mildly reduced. The right ventricular size is moderately enlarged. There is moderately elevated pulmonary artery systolic pressure.  3. Right atrial size was moderately dilated.  4. The aortic valve is tricuspid.  5. The inferior vena cava is dilated in size with <50% respiratory variability, suggesting right atrial pressure of 15 mmHg. FINDINGS  Left Ventricle: Left ventricular ejection fraction, by estimation, is 40 to 45%. The left ventricle has mildly decreased function. The left ventricle demonstrates global hypokinesis. There is mild concentric left ventricular hypertrophy. The interventricular septum is flattened in diastole ('D' shaped left ventricle), consistent with right ventricular volume overload. Right Ventricle: The right ventricular size is moderately enlarged. Right  ventricular systolic function is mildly reduced. There is moderately elevated pulmonary artery systolic pressure. The tricuspid regurgitant velocity is 3.05 m/s, and with an assumed right atrial pressure of 15 mmHg, the estimated right ventricular systolic pressure is 82.4 mmHg. Right Atrium: Right atrial size was moderately dilated. Pericardium: Trivial pericardial effusion is present. Tricuspid Valve: Tricuspid valve regurgitation is trivial. Aortic Valve: The aortic valve is tricuspid. Venous: The inferior vena cava is dilated in size with less than 50% respiratory variability, suggesting right atrial pressure of 15 mmHg.  LEFT VENTRICLE PLAX 2D LVIDd:         5.10 cm LVIDs:         4.00 cm LV PW:         1.10 cm LV IVS:        1.10 cm LVOT diam:     2.30 cm LVOT Area:     4.15 cm  IVC IVC diam: 3.20 cm LEFT ATRIUM         Index      RIGHT ATRIUM           Index LA diam:    3.80 cm 1.72 cm/m RA Area:     23.80 cm                                RA Volume:   73.10 ml  33.04 ml/m   AORTA Ao Root diam: 3.20 cm TRICUSPID VALVE TR Peak grad:   37.2 mmHg TR Vmax:        305.00 cm/s  SHUNTS Systemic Diam: 2.30 cm Amy Latch MD Electronically signed by Amy Latch MD Signature Date/Time: 05/12/2020/1:38:53 PM    Final    US Abdomen Limited RUQ  Result Date: 05/13/2020 CLINICAL DATA:  Cirrhosis EXAM: ULTRASOUND ABDOMEN LIMITED RIGHT UPPER QUADRANT COMPARISON:  07/05/2005 abdominal CT FINDINGS: Gallbladder: Partial distension of the gallbladder. No gallstones or wall thickening visualized. No sonographic Murphy sign noted by sonographer. Common bile duct: Diameter: 4 mm Liver: Lobulated liver surface and large central fissures, correlating with history of cirrhosis. No evidence of mass lesion. Portal vein is patent on color Doppler imaging with normal direction of blood flow towards the liver. Other: Right pleural effusion that is known from chest x-ray yesterday. IMPRESSION: 1. Cirrhosis without ascites  or visible mass. 2. Right pleural effusion. Electronically Signed   By: Monte Fantasia M.D.   On: 05/13/2020 11:38        Scheduled Meds: . apixaban  5 mg Oral BID  . arformoterol  15 mcg Nebulization BID  . budesonide (PULMICORT) nebulizer solution  0.5 mg Nebulization BID  . chlorhexidine  15 mL Mouth Rinse BID  . Chlorhexidine Gluconate Cloth  6 each Topical Daily  . diltiazem  360 mg Oral Daily  . feeding supplement (ENSURE ENLIVE)  237 mL Oral TID BM  . insulin aspart  0-9 Units Subcutaneous TID WC  . mouth rinse  15 mL Mouth Rinse q12n4p  . multivitamin with minerals  1 tablet Oral Daily  . sodium chloride flush  3 mL Intravenous Q12H   Continuous Infusions: . sodium chloride    . cefTRIAXone (ROCEPHIN)  IV 1 g (05/13/20 1628)    Assessment & Plan:   Principal Problem:   Acute CHF (congestive heart failure) (HCC) Active Problems:   Diabetes (HCC)   Hypotension   AKI (acute kidney injury) ([place])   Permanent atrial fibrillation ([place])   Cirrhosis ([place])   Acute hypoxemic and hypercapnic respiratory failure -multifactoria. CHF and possible asthma exacerbation Last Echo is 45-50% in 08/2019.  Will repeat. Strict I's and O's.  Daily weights PRN Xopenex Pulmonary consulted, input was appreciated-held Lasix.  Started on IV steroids...>now discontinued as it may cause delirium -wean O2 for sats 90-95% -brovana + pulmicort BID -PRN xopenex  IV antibiotics empirically Rt pleural effusion may need ?thoracentesis . ABG elevated PCO2 , on Bipap (changes made by pccm) continue BiPAP QHS and PRN daytim-e sleep  EchO:EF 40-45%, rv ef mildly reduced, rv volume overload, rv enlarged, elevated PASP LE venous US: negative dvt VQ low probability of PE Plan: F.u with pccm for further recommendation. Will discuss about rt pleural effusion  Hypotension Somewhat improved without intervention.  We will hold antihypertensive  AKI Likely from vascular congestion. Received lasix  iv. Monitor closely, if no improvement will consult nephrology renal US: Small amount of left-sided perinephric fluid. Improving now cr 1.41  Hyperkalemia-mild Since keeps rising slowly, will give kayexalate one dose today monitor  Permanent atrial fibrillation Nonsustained RVR , worsened by respiratory issues Continue diltiazem but will hold losartan due to AKI and some hypotension Continue Eliquis Plan: Add low dose beta blk for better rate control  Non-insulin-dependent type 2 diabetes Low-dose sliding scale   DVT prophylaxis: Eliquis Code Status: DNR Family Communication: spoke to daughter and husband Disposition Plan: Back to Home Inpatient status Remains inpatient appropriate because:Inpatient level of care appropriate due to severity of illness  Dispo: The patient is from: Home  Anticipated d/c is to: Home  Anticipated d/c date is: > 3 days  Patient currently is not medically stable to d/c.On bipap.     LOS: 4 days   Time spent: 45 min with more than >50% on coc    Amy Riddle  Kurtis Bushman, MD [facility] Pager [phone]  If 7PM-7AM, please contact night-coverage [email or web address] Password [facility] 05/14/2020, 8:06 AM Patient ID: ALIEGHA PAULLIN, female   DOB: March 19, 1946, 74 y.o.

## 2020-05-14 NOTE — Progress Notes (Signed)
   05/14/20 1419  BiPAP/CPAP/SIPAP  BiPAP/CPAP/SIPAP Pt Type Adult  Mask Type Full face mask  Mask Size Large  Set Rate 8 breaths/min  Respiratory Rate 18 breaths/min  IPAP 12 cmH20  EPAP 6 cmH2O  Oxygen Percent 40 %  Minute Ventilation 12.4  Leak 28  Peak Inspiratory Pressure (PIP) 15  Tidal Volume (Vt) 705  BiPAP/CPAP/SIPAP BiPAP  Press High Alarm 25 cmH2O  Press Low Alarm 5 cmH2O  BiPAP/CPAP /SiPAP Vitals  SpO2 91 %  Patient placed on Bipap, above settings while sleeping. RN noted some confusion. RT will continue to monitor. Tolerating well at this time.

## 2020-05-14 NOTE — Progress Notes (Signed)
NAME:  Amy Riddle, MRN:  [id number], DOB:  Jun 20, 1946, LOS: 4 ADMISSION DATE:  05/10/2020, CONSULTATION DATE: 05/11/20  REFERRING MD:  Dr. Kurtis Bushman / [facility] , CHIEF COMPLAINT:  SOB  Brief History   74 yo female presented with worsening dyspnea for 2 weeks with productive cough, wheezing, leg edema with concern for asthma and CHF exacerbation.  Past Medical History  A-Fib - on eliquis, cardizem.  Has required cardioversion   HTN - on losartan, lasix.  Prior LVEF 40-45% Asthma - on advair  DM II - on metformin  Morbid Obesity  Former Smoker - quit [facility]   6/08 Admit  6/09 PCCM consulted   Consults:    Procedures:    Significant Diagnostic Tests:   Limited ECHO 6/9 >> LVEF ~ 40-45%, LV mildly decreased function, LV demonstrates global hypokinesis, interventricular septum flattened in systole consistent with RV pressure overload, RV systolic function moderately reduced, RV is severely enlarged, LA mildly dilated, RA severely dilated  Renal US 6/9 >> no mass or hydronephrosis, small amount left-sided perinephric fluid  V/Q scan 6/11 >> low probability for PE  Micro Data:  COVID 6/8 >> negative   Antimicrobials:  Rocephin 6/9 >>  Azithro 6/9 >> 6/11  Interim history/subjective:  Back on Bipap this afternoon.  He daughter feels like she is making progress.  Objective   Blood pressure (!) 144/97, pulse 86, temperature (!) 97.5 F (36.4 C), temperature source Oral, resp. rate 20, height 5' 2.5" (1.588 m), weight 124.7 kg, SpO2 91 %.    FiO2 (%):  [40 %] 40 %   Intake/Output Summary (Last 24 hours) at 05/14/2020 1428 Last data filed at 05/14/2020 1243 Gross per 24 hour  Intake 20 ml  Output 2350 ml  Net -2330 ml   Filed Weights   05/12/20 0629 05/13/20 0416 05/14/20 0612  Weight: 126.2 kg 125.5 kg 124.7 kg    Examination:  General - sleepy, wakes up easily Eyes - pupils reactive ENT - no sinus tenderness, no stridor Cardiac -  irregular Chest - decreased BS, no wheeze Abdomen - soft, non tender, + bowel sounds Extremities - 1+ edema Skin - no rashes Neuro - follows commands  Resolved Hospital Problem list      Assessment & Plan:   Acute on chronic hypoxic/hypercapnic respiratory failure from CHF and asthma exacerbation with suspected sleep disordered breathing. - continue pulmicort, brovana - goal SpO2 88 to 95% - Bipap qhs and prn - will need further pulmonary/sleep medicine assessment as outpt  Permanent A fib, acute on chronic combined CHF, HTN. - per primary team  Best practice:  Diet: heart healthy DVT prophylaxis: eliquis Mobility: as tolerated  Code Status: DNR  Disposition: Progressive  PCCM will follow up in 6/14 - call if help needed sooner.  Labs    CMP Latest Ref Rng & Units 05/14/2020 05/13/2020 05/12/2020  Glucose 70 - 99 mg/dL 161(H) 176(H) 173(H)  BUN 8 - 23 mg/dL 81(H) 97(H) 95(H)  Creatinine 0.44 - 1.00 mg/dL 1.41(H) 1.78(H) 2.00(H)  Sodium 135 - 145 mmol/L 144 140 137  Potassium 3.5 - 5.1 mmol/L 5.3(H) 5.2(H) 4.5  Chloride 98 - 111 mmol/L 99 95(L) 95(L)  CO2 22 - 32 mmol/L 36(H) 35(H) 32  Calcium 8.9 - 10.3 mg/dL 9.9 9.6 9.1  Total Protein 6.5 - 8.1 g/dL - - -  Total Bilirubin 0.3 - 1.2 mg/dL - - -  Alkaline Phos 38 - 126 U/L - - -  AST 15 - 41 U/L - - -  ALT 0 - 44 U/L - - -    CBC Latest Ref Rng & Units 05/12/2020 05/10/2020 10/01/2019  WBC 4.0 - 10.5 K/uL 9.9 13.1(H) 7.8  Hemoglobin 12.0 - 15.0 g/dL 15.2(H) 16.3(H) 15.8  Hematocrit 36 - 46 % 51.0(H) 52.5(H) 49.3(H)  Platelets 150 - 400 K/uL 249 344 235    ABG    Component Value Date/Time   PHART 7.299 (L) 05/13/2020 1155   PCO2ART 78.7 (HH) 05/13/2020 1155   PO2ART 54.8 (L) 05/13/2020 1155   HCO3 37.7 (H) 05/13/2020 1155   O2SAT 88.4 05/13/2020 1155    CBG (last 3)  Recent Labs    05/13/20 2309 05/14/20 0617 05/14/20 1225  GLUCAP 150* 166* 189*     Signature:   Chesley Mires, MD [place] Pager - [phone] 05/14/2020, 2:28 PM

## 2020-05-15 LAB — BASIC METABOLIC PANEL
Anion gap: 7 (ref 5–15)
BUN: 64 mg/dL — ABNORMAL HIGH (ref 8–23)
CO2: 37 mmol/L — ABNORMAL HIGH (ref 22–32)
Calcium: 9.7 mg/dL (ref 8.9–10.3)
Chloride: 101 mmol/L (ref 98–111)
Creatinine, Ser: 1.18 mg/dL — ABNORMAL HIGH (ref 0.44–1.00)
GFR calc Af Amer: 54 mL/min — ABNORMAL LOW (ref >60)
GFR calc non Af Amer: 46 mL/min — ABNORMAL LOW (ref >60)
Glucose, Bld: 147 mg/dL — ABNORMAL HIGH (ref 70–99)
Potassium: 5.6 mmol/L — ABNORMAL HIGH (ref 3.5–5.1)
Sodium: 145 mmol/L (ref 135–145)

## 2020-05-15 LAB — BLOOD GAS, ARTERIAL
Acid-Base Excess: 14.8 mmol/L — ABNORMAL HIGH (ref 0.0–2.0)
Acid-Base Excess: 15.3 mmol/L — ABNORMAL HIGH (ref 0.0–2.0)
Bicarbonate: 42.1 mmol/L — ABNORMAL HIGH (ref 20.0–28.0)
Bicarbonate: 41.5 mmol/L — ABNORMAL HIGH (ref 20.0–28.0)
Drawn by: 24610
FIO2: 56
FIO2: 50
O2 Saturation: 93.7 %
O2 Saturation: 97.5 %
Patient temperature: 37
Patient temperature: 36.7
pCO2 arterial: 94.5 mmHg (ref 32.0–48.0)
pCO2 arterial: 75.1 mmHg (ref 32.0–48.0)
pH, Arterial: 7.271 — ABNORMAL LOW (ref 7.350–7.450)
pH, Arterial: 7.36 (ref 7.350–7.450)
pO2, Arterial: 70.8 mmHg — ABNORMAL LOW (ref 83.0–108.0)
pO2, Arterial: 84.5 mmHg (ref 83.0–108.0)

## 2020-05-15 LAB — GLUCOSE, CAPILLARY
Glucose-Capillary: 143 mg/dL — ABNORMAL HIGH (ref 70–99)
Glucose-Capillary: 176 mg/dL — ABNORMAL HIGH (ref 70–99)
Glucose-Capillary: 125 mg/dL — ABNORMAL HIGH (ref 70–99)
Glucose-Capillary: 152 mg/dL — ABNORMAL HIGH (ref 70–99)

## 2020-05-15 LAB — MAGNESIUM: Magnesium: 2.7 mg/dL — ABNORMAL HIGH (ref 1.7–2.4)

## 2020-05-15 LAB — BRAIN NATRIURETIC PEPTIDE: B Natriuretic Peptide: 924.5 pg/mL — ABNORMAL HIGH (ref 0.0–100.0)

## 2020-05-15 MED ORDER — SODIUM POLYSTYRENE SULFONATE 15 GM/60ML PO SUSP
45.0000 g | Freq: Once | ORAL | Status: AC
  Administered 2020-05-15: 45 g via ORAL
  Filled 2020-05-15: qty 180

## 2020-05-15 MED ORDER — FUROSEMIDE 10 MG/ML IJ SOLN
20.0000 mg | Freq: Two times a day (BID) | INTRAMUSCULAR | Status: DC
  Administered 2020-05-15 – 2020-05-16 (×2): 20 mg via INTRAVENOUS
  Filled 2020-05-15 (×3): qty 2

## 2020-05-15 MED ORDER — NEPRO/CARBSTEADY PO LIQD
237.0000 mL | Freq: Three times a day (TID) | ORAL | Status: DC
  Administered 2020-05-16 – 2020-05-20 (×10): 237 mL via ORAL

## 2020-05-15 NOTE — Progress Notes (Signed)
   05/15/20 0803  Assess: MEWS Score  Temp 97.8 F (36.6 C)  BP (!) 154/115  O2 Device Bi-PAP  Assess: MEWS Score  MEWS Temp 0  MEWS Systolic 0  MEWS Pulse 0  MEWS RR 2  MEWS LOC 0  MEWS Score 2  MEWS Score Color Yellow  Assess: if the MEWS score is Yellow or Red  Were vital signs taken at a resting state? Yes  Focused Assessment Documented focused assessment  Early Detection of Sepsis Score *See Row Information* Low  MEWS guidelines implemented *See Row Information* No, previously yellow, continue vital signs every 4 hours  Treat  MEWS Interventions Consulted Respiratory Therapy  Notify: Charge Nurse/RN  Name of Charge Nurse/RN Notified  (already aware.)  Notify: Provider  Provider Name/Title  (already aware on rounds this am)  Notify: Rapid Response  Name of Rapid Response RN Notified  (not this time no change in status chronic)  Document  Patient Outcome Other (Comment) (cont bipap and high flow o2 as needed.)  Progress note created (see row info) Yes

## 2020-05-15 NOTE — Progress Notes (Signed)
PROGRESS NOTE    Amy Riddle  ZCH:[id number] DOB: 27-Apr-1946 DOA: 05/10/2020 PCP: Leonard Downing, MD    Brief Narrative:  Amy Riddle is a 74 y.o. female with medical history significant for Hx of intermittent asthma, HTN, permanent atrial fibrillation on Eliquis, Type 2 DM, and morbid obesity who presents with worsening shortness of breath.   Patient reports that she has been having ongoing shortness of breath that is worse this week.  Worse especially with exertion and better at rest.  Had to increase to sleeping on 2 pillows.  Has had wheezing, increased cough with sputum production.  No fever.  Also noted bilateral lower extremity edema.  She does not have a formal diagnosis of CHF.  She was evaluated by her PCP about 4 days ago and had increases to her Advair and increase of her Lasix from 40 mg to 60 mg.  However she did not feel like she had increased urine output from that.  Patient presented presented to ED on CPAP and was found to have O2 saturation in 70s.  Not able to wean down to a nonrebreather on 4 L.  Has leukocytosis of 13.1. Creatinine elevated to 2.69 from a prior of 0.70. Elevated bilirubin of 1.7. Anion gap of 18. BNP of 605.4.   CXR shows progressive right pleural effusion with increasing fluid and cardiomegaly with vascular congestion.  She was given Solu-Medrol, DuoNeb and 40 mg Lasix in the ED.   Consultants:   pccm  Procedures:  Antimicrobials:   ceftiaxone and azithromycin   Subjective: On BiPAP this AM.  Opens her eyes.  When asked if she has abdominal pain, dizziness, chest pain nods no.  On restraints.   Objective: Vitals:   05/15/20 0600 05/15/20 0731 05/15/20 0735 05/15/20 0736  BP: (!) 139/93     Pulse: (!) 107   (!) 102  Resp: 15   (!) 26  Temp:      TempSrc:      SpO2: 94% (!) 89% 93% 92%  Weight:      Height:        Intake/Output Summary (Last 24 hours) at 05/15/2020 0802 Last data filed at 05/15/2020  0645 Gross per 24 hour  Intake 680 ml  Output 1700 ml  Net -1020 ml   Filed Weights   05/13/20 0416 05/14/20 0612 05/15/20 0108  Weight: 125.5 kg 124.7 kg 124.2 kg    Examination:  General exam: appears well, laying in bed, on restraints and on BiPAP respiratory system: Early cta, no w/r/r  Cardiovascular system: S1 & S2 heard, RRR. No JVD, murmurs, rubs, gallops or clicks.  Gastrointestinal system: Abdomen is nondistended, soft and nontender. Normal bowel sounds heard.  No guarding Central nervous system:  Awake alert, unable to assess the rest Extremities: trace edema b/lr.  Bilateral restraints Skin: Warm dry Psychiatry: mood acceptable for current setting.    Data Reviewed: I have personally reviewed following labs and imaging studies  CBC: Recent Labs  Lab 05/10/20 2010 05/12/20 0440  WBC 13.1* 9.9  NEUTROABS 10.3*  --   HGB 16.3* 15.2*  HCT 52.5* 51.0*  MCV 87.5 90.3  PLT 344 287   Basic Metabolic Panel: Recent Labs  Lab 05/10/20 2010 05/10/20 2045 05/11/20 0212 05/12/20 0440 05/13/20 0735 05/14/20 0451  NA 137  --  136 137 140 144  K 4.7  --  4.5 4.5 5.2* 5.3*  CL 92*  --  93* 95* 95* 99  CO2 27  --  29 32 35* 36*  GLUCOSE 142*  --  175* 173* 176* 161*  BUN 95*  --  93* 95* 97* 81*  CREATININE 2.69*  --  2.56* 2.00* 1.78* 1.41*  CALCIUM 9.2  --  9.0 9.1 9.6 9.9  MG  --  2.1  --  2.1 2.6* 2.8*  PHOS  --   --   --   --   --  2.9   GFR: Estimated Creatinine Clearance: 46.5 mL/min (A) (by C-G formula based on SCr of 1.41 mg/dL (H)). Liver Function Tests: Recent Labs  Lab 05/10/20 2010  AST 22  ALT 36  ALKPHOS 49  BILITOT 1.7*  PROT 6.5  ALBUMIN 3.7   No results for input(s): LIPASE, AMYLASE in the last 168 hours. No results for input(s): AMMONIA in the last 168 hours. Coagulation Profile: No results for input(s): INR, PROTIME in the last 168 hours. Cardiac Enzymes: No results for input(s): CKTOTAL, CKMB, CKMBINDEX, TROPONINI in the last  168 hours. BNP (last 3 results) No results for input(s): PROBNP in the last 8760 hours. HbA1C: No results for input(s): HGBA1C in the last 72 hours. CBG: Recent Labs  Lab 05/14/20 0617 05/14/20 1225 05/14/20 1635 05/14/20 2152 05/15/20 0636  GLUCAP 166* 189* 185* 186* 143*   Lipid Profile: No results for input(s): CHOL, HDL, LDLCALC, TRIG, CHOLHDL, LDLDIRECT in the last 72 hours. Thyroid Function Tests: No results for input(s): TSH, T4TOTAL, FREET4, T3FREE, THYROIDAB in the last 72 hours. Anemia Panel: No results for input(s): VITAMINB12, FOLATE, FERRITIN, TIBC, IRON, RETICCTPCT in the last 72 hours. Sepsis Labs: No results for input(s): PROCALCITON, LATICACIDVEN in the last 168 hours.  Recent Results (from the past 240 hour(s))  SARS Coronavirus 2 by RT PCR (hospital order, performed in [facility] hospital lab) Nasopharyngeal Nasopharyngeal Swab     Status: None   Collection Time: 05/10/20  8:31 PM   Specimen: Nasopharyngeal Swab  Result Value Ref Range Status   SARS Coronavirus 2 NEGATIVE NEGATIVE Final    Comment: (NOTE) SARS-CoV-2 target nucleic acids are NOT DETECTED. The SARS-CoV-2 RNA is generally detectable in upper and lower respiratory specimens during the acute phase of infection. The lowest concentration of SARS-CoV-2 viral copies this assay can detect is 250 copies / mL. A negative result does not preclude SARS-CoV-2 infection and should not be used as the sole basis for treatment or other patient management decisions.  A negative result may occur with improper specimen collection / handling, submission of specimen other than nasopharyngeal swab, presence of viral mutation(s) within the areas targeted by this assay, and inadequate number of viral copies (<250 copies / mL). A negative result must be combined with clinical observations, patient history, and epidemiological information. Fact Sheet for Patients:   StrictlyIdeas.no Fact  Sheet for Healthcare Providers: BankingDealers.co.za This test is not yet approved or cleared  by the [place] FDA and has been authorized for detection and/or diagnosis of SARS-CoV-2 by FDA under an Emergency Use Authorization (EUA).  This EUA will remain in effect (meaning this test can be used) for the duration of the COVID-19 declaration under Section 564(b)(1) of the Act, 21 U.S.C. section 360bbb-3(b)(1), unless the authorization is terminated or revoked sooner. Performed at [facility], [place] [address], [place], [place] [address]   Culture, Urine     Status: Abnormal   Collection Time: 05/11/20  1:58 AM   Specimen: Urine, Catheterized  Result Value Ref Range Status   Specimen Description URINE, CATHETERIZED  Final   Special Requests ADDED  Final   Culture (A)  Final    >=100,000 COLONIES/mL GROUP B STREP(S.AGALACTIAE)ISOLATED TESTING AGAINST S. AGALACTIAE NOT ROUTINELY PERFORMED DUE TO PREDICTABILITY OF AMP/PEN/VAN SUSCEPTIBILITY. Performed at [facility], [place] [address], [place], [place] [address]    Report Status 05/13/2020 FINAL  Final  Culture, blood (Routine X 2) w Reflex to ID Panel     Status: None (Preliminary result)   Collection Time: 05/11/20  3:20 PM   Specimen: BLOOD  Result Value Ref Range Status   Specimen Description BLOOD RIGHT ANTECUBITAL  Final   Special Requests   Final    BOTTLES DRAWN AEROBIC AND ANAEROBIC Blood Culture adequate volume   Culture   Final    NO GROWTH 4 DAYS Performed at [facility], [place] [address], [place], [place] [address]    Report Status PENDING  Incomplete  Culture, blood (Routine X 2) w Reflex to ID Panel     Status: None (Preliminary result)   Collection Time: 05/11/20  3:20 PM   Specimen: BLOOD RIGHT HAND  Result Value Ref Range Status   Specimen Description BLOOD RIGHT HAND  Final   Special Requests   Final    BOTTLES DRAWN AEROBIC ONLY Blood Culture adequate volume   Culture    Final    NO GROWTH 4 DAYS Performed at [facility], [place] [address] [place], [place] [address]    Report Status PENDING  Incomplete         Radiology Studies: NM Pulmonary Perfusion  Result Date: 05/13/2020 CLINICAL DATA:  Hypoxia.  Shortness of breath. EXAM: NUCLEAR MEDICINE PERFUSION LUNG SCAN TECHNIQUE: Perfusion images were obtained in multiple projections after intravenous injection of radiopharmaceutical. Ventilation scans not performed, patient with altered mental status and had difficulty tolerating the exam, terminated the exam after perfusion imaging. RADIOPHARMACEUTICALS:  1.6 mCi Tc-52m MAA IV COMPARISON:  Chest radiograph 05/12/2020 FINDINGS: An oblique area of linear photopenia in the right lung parallels the fissure and likely corresponds to fluid in the fissure as seen on radiograph. Photopenia at the right costophrenic angle related to pleural effusion. There is otherwise homogeneous distribution of radiotracer throughout the lungs. No peripheral or wedge-shaped perfusion defects. IMPRESSION: 1. Low probability for pulmonary embolus. 2. Right pleural effusion with fluid in the fissure. Electronically Signed   By: Keith Rake M.D.   On: 05/13/2020 17:01   DG Chest Port 1 View  Result Date: 05/14/2020 CLINICAL DATA:  Abnormal respirations. EXAM: PORTABLE CHEST 1 VIEW COMPARISON:  05/12/2020 FINDINGS: Patient is moderately rotated to the right. Lungs are adequately inflated without focal airspace consolidation or effusion. Inferolateral right lung base cut off the image. Moderate stable cardiomegaly. Persistent opacification in the right retrocardiac region likely atelectasis/effusion. Remainder the exam is unchanged. IMPRESSION: Persistent right base/retrocardiac opacification likely effusion/atelectasis. Moderate stable cardiomegaly. Electronically Signed   By: Marin Olp M.D.   On: 05/14/2020 10:30   US Abdomen Limited RUQ  Result Date: 05/13/2020 CLINICAL DATA:   Cirrhosis EXAM: ULTRASOUND ABDOMEN LIMITED RIGHT UPPER QUADRANT COMPARISON:  07/05/2005 abdominal CT FINDINGS: Gallbladder: Partial distension of the gallbladder. No gallstones or wall thickening visualized. No sonographic Murphy sign noted by sonographer. Common bile duct: Diameter: 4 mm Liver: Lobulated liver surface and large central fissures, correlating with history of cirrhosis. No evidence of mass lesion. Portal vein is patent on color Doppler imaging with normal direction of blood flow towards the liver. Other: Right pleural effusion that  is known from chest x-ray yesterday. IMPRESSION: 1. Cirrhosis without ascites or visible mass. 2. Right pleural effusion. Electronically Signed   By: Monte Fantasia M.D.   On: 05/13/2020 11:38        Scheduled Meds: . apixaban  5 mg Oral BID  . arformoterol  15 mcg Nebulization BID  . budesonide (PULMICORT) nebulizer solution  0.5 mg Nebulization BID  . chlorhexidine  15 mL Mouth Rinse BID  . Chlorhexidine Gluconate Cloth  6 each Topical Daily  . diltiazem  360 mg Oral Daily  . feeding supplement (ENSURE ENLIVE)  237 mL Oral TID BM  . insulin aspart  0-9 Units Subcutaneous TID WC  . mouth rinse  15 mL Mouth Rinse q12n4p  . metoprolol tartrate  12.5 mg Oral Q6H  . multivitamin with minerals  1 tablet Oral Daily  . sodium chloride flush  3 mL Intravenous Q12H   Continuous Infusions: . sodium chloride    . cefTRIAXone (ROCEPHIN)  IV 1 g (05/14/20 1435)    Assessment & Plan:   Principal Problem:   Acute CHF (congestive heart failure) (HCC) Active Problems:   Diabetes (HCC)   Hypotension   AKI (acute kidney injury) ([place])   Permanent atrial fibrillation ([place])   Cirrhosis ([place])   Acute hypoxemic and hypercapnic respiratory failure -multifactoria. CHF and possible asthma exacerbation Last Echo is 45-50% in 08/2019.  Will repeat. Strict I's and O's.  Daily weights PRN Xopenex Pulmonary consulted, input was appreciated-held Lasix.  Started  on IV steroids...>now discontinued as it may cause delirium -wean O2 for sats 90-95% -brovana + pulmicort BID -PRN xopenex  IV antibiotics empirically Rt pleural effusion may need ?thoracentesis . ABG elevated PCO2 , on Bipap (changes made by pccm) continue BiPAP QHS and PRN daytim-e sleep  EchO:EF 40-45%, rv ef mildly reduced, rv volume overload, rv enlarged, elevated PASP LE venous US: negative dvt VQ low probability of PE 6/13-Volume overaloded today, bnp up, likely from afib rvr., will give lasix 20mg  iv bid.    Hypotension Resolved.  AKI Likely from vascular congestion. Received lasix iv. Monitor closely, if no improvement will consult nephrology renal US: Small amount of left-sided perinephric fluid. Improved, cr today 1.18  Hyperkalemia k still going up Will give kayexalate again, add low K diet.   Permanent atrial fibrillation Nonsustained RVR , worsened by respiratory issues HR better with beta blk, will continue  Continue with Cardizem Continue Eliquis   Non-insulin-dependent type 2 diabetes Low-dose sliding scale   DVT prophylaxis: Eliquis Code Status: DNR Family Communication: spoke to daughter and husband Disposition Plan: Back to Home Inpatient status Remains inpatient appropriate because:Inpatient level of care appropriate due to severity of illness  Dispo: The patient is from: Home  Anticipated d/c is to: Home  Anticipated d/c date is: > 3 days  Barrier:Patient currently is not medically stable to d/c.On bipap. Needs iv lasix    LOS: 5 days   Time spent: 45 min with more than >50% on coc    Nolberto Hanlon, MD [facility] Pager [phone]  If 7PM-7AM, please contact night-coverage [email or web address] Password [facility] [address] 05/15/2020, 8:02 AM

## 2020-05-15 NOTE — Progress Notes (Signed)
   05/15/20 0748  Assess: MEWS Score  ECG Heart Rate 100  Assess: MEWS Score  MEWS Temp 0  MEWS Systolic 0  MEWS Pulse 0  MEWS RR 2  MEWS LOC 0  MEWS Score 2  MEWS Score Color Yellow  Assess: if the MEWS score is Yellow or Red  Were vital signs taken at a resting state? Yes  Focused Assessment Documented focused assessment  Early Detection of Sepsis Score *See Row Information* Low  MEWS guidelines implemented *See Row Information* No, previously yellow, continue vital signs every 4 hours  Treat  MEWS Interventions Consulted Respiratory Therapy  Notify: Charge Nurse/RN  Name of Charge Nurse/RN Notified  (aware already)  Notify: Provider  Provider Name/Title  (aware on rounds this am in see patient.)  Notify: Rapid Response  Name of Rapid Response RN Notified  (not this time chronic condition no change.)  Document  Patient Outcome Other (Comment) (cont current tx's with o2 therapies.)  Progress note created (see row info) Yes

## 2020-05-15 NOTE — Progress Notes (Signed)
Results for Amy Riddle, Amy Riddle (MRN [id number]) as of 05/15/2020 16:48  Ref. Range 05/15/2020 59:45  BASIC METABOLIC PANEL Unknown Rpt (A)  Sodium Latest Ref Range: 135 - 145 mmol/L 145  Potassium Latest Ref Range: 3.5 - 5.1 mmol/L 5.6 (H)  Chloride Latest Ref Range: 98 - 111 mmol/L 101  CO2 Latest Ref Range: 22 - 32 mmol/L 37 (H)  Glucose Latest Ref Range: 70 - 99 mg/dL 147 (H)  BUN Latest Ref Range: 8 - 23 mg/dL 64 (H)  Creatinine Latest Ref Range: 0.44 - 1.00 mg/dL 1.18 (H)  Calcium Latest Ref Range: 8.9 - 10.3 mg/dL 9.7  Anion gap Latest Ref Range: 5 - 15  7  Magnesium Latest Ref Range: 1.7 - 2.4 mg/dL 2.7 (H)  GFR, Est Non African American Latest Ref Range: >60 mL/min 46 (L)  GFR, Est African American Latest Ref Range: >60 mL/min 54 (L)  B Natriuretic Peptide Latest Ref Range: 0.0 - 100.0 pg/mL 924.5 (H)  Patient received kayexacelate today and lasix for elevated K+ level. Pharmacy flagged meds also for md that elevate K+ level see noted.

## 2020-05-15 NOTE — Progress Notes (Signed)
Pharmacist Heart Failure Core Measure Documentation  Assessment: Amy Riddle has an EF documented as 40-45% on 6/10 by ECHO.  Rationale: Heart failure patients with left ventricular systolic dysfunction (LVSD) and an EF < 40% should be prescribed an angiotensin converting enzyme inhibitor (ACEI) or angiotensin receptor blocker (ARB) at discharge unless a contraindication is documented in the medical record.  This patient is not currently on an ACEI or ARB for HF.  This note is being placed in the record in order to provide documentation that a contraindication to the use of these agents is present for this encounter.  ACE Inhibitor or Angiotensin Receptor Blocker is contraindicated (specify all that apply)  []   ACEI allergy AND ARB allergy []   Angioedema []   Moderate or severe aortic stenosis [x]   Hyperkalemia []   Hypotension []   Renal artery stenosis []   Worsening renal function, preexisting renal disease or dysfunction  Benetta Spar, PharmD, BCPS, BCCP Clinical Pharmacist  Please check AMION for all [employer] phone numbers After 10:00 PM, call [place]

## 2020-05-15 NOTE — Progress Notes (Signed)
This note also relates to the following rows which could not be included: Pulse Rate - Cannot attach notes to unvalidated device data ECG Heart Rate - Cannot attach notes to unvalidated device data Resp - Cannot attach notes to unvalidated device data SpO2 - Cannot attach notes to unvalidated device data    05/15/20 0900  Assess: MEWS Score  Level of Consciousness Alert  Assess: MEWS Score  MEWS Temp 0  MEWS Systolic 0  MEWS Pulse 0  MEWS RR 2  MEWS LOC 0  MEWS Score 2  MEWS Score Color Yellow  Assess: if the MEWS score is Yellow or Red  Were vital signs taken at a resting state? Yes  Focused Assessment Documented focused assessment  Early Detection of Sepsis Score *See Row Information* Low  MEWS guidelines implemented *See Row Information* No, previously yellow, continue vital signs every 4 hours  Treat  MEWS Interventions Consulted Respiratory Therapy  Notify: Charge Nurse/RN  Name of Charge Nurse/RN Notified  Helene Kelp)  Date Charge Nurse/RN Notified 05/15/20  Time Charge Nurse/RN Notified 0900  Notify: Provider  Provider Name/Title  (not this time is chronic on/off bipap MD aware.)  Notify: Rapid Response  Name of Rapid Response RN Notified  (not this time is chronic condition requires on/off highflow )  Document  Patient Outcome Other (Comment) (stable on bipap/highflow o2.)  Progress note created (see row info)  (patient conts go from hiflow o2 to bipap as needed.)

## 2020-05-15 NOTE — Progress Notes (Signed)
pCO2 arterial Blood gas, arterial Collected: 05/15/20 1530  Result status: Final  Resulting lab: [place]  Reference range: 32 - 48 mmHg  Value: 94.5High Panic   Comment: CRITICAL RESULT CALLED TO, READ BACK BY AND VERIFIED WITH:  BARBIE Hiroto Saltzman RN.@1546  ON 6.13.21 BY TCALDWELL MT.   *Additional information available - comment   Blood gas, arterial (Order [id number]) Contains critical dataBlood gas, arterial Order: [id number] Status:  Final result Visible to patient:  No (scheduled for 05/15/2020 4:49 PM) Next appt:  07/21/2020 at 10:40 AM in Cardiology Minus Breeding, MD)  0 Result Notes  Ref Range & Units 15:30  (05/15/20) 2 d ago  (05/13/20) 2 d ago  (05/13/20) 3 d ago  (05/12/20)  FIO2  56.00  40.00  40.00  60.00   pH, Arterial 7.35 - 7.45 7.271Low  7.299Low  7.258Low  7.246Low   pCO2 arterial 32 - 48 mmHg 94.5High Panic  78.7High Panic CM  86.7High Panic CM  82.4High Panic CM   Comment: CRITICAL RESULT CALLED TO, READ BACK BY AND VERIFIED WITH:  BARBIE Marco Raper RN.@1546  ON 6.13.21 BY TCALDWELL MT.   pO2, Arterial 83 - 108 mmHg 70.8Low  54.8Low  88.9  74.3Low   Bicarbonate 20.0 - 28.0 mmol/L 42.1High  37.7High  37.7High  34.8High   Acid-Base Excess 0.0 - 2.0 mmol/L 14.8High  11.0High  10.5High  7.6High   O2 Saturation % 93.7  88.4  96.6  94.1   Patient temperature  37.0  36.5  36.5  36.5   Collection site  RIGHT BRACHIAL  RIGHT RADIAL  RIGHT RADIAL  LEFT RADIAL   Drawn by  COLLECTED BY RT  COLLECTED BY RT CM  COLLECTED BY RT CM  [id number] CM   Comment: KAL [id number]  Sample type  ARTERIAL DRAW  ARTERIAL DRAW  ARTERIAL DRAW  ARTERIAL   Allens test (pass/fail) PASS BRACHIAL ARTERYAbnormal  PASS CM  PASS CM  PASS CM   Comment: Performed at [facility], [place] [address], [place], [place] [address]  Resulting Agency  [facility] [place] [facility] [place] [facility]      Specimen Collected: 05/15/20 15:30 Last Resulted:  05/15/20 15:49      Lab Flowsheet    Order Details    View Encounter    Lab and Collection Details    Routing    Result History     CM=Additional comments    Result Care Coordination   Patient Communication   05/15/2020 4:49 PM   Not seen Back to Top      MyChart Results Release  MyChart Status: Code Expired Results Release  Order Questions    Critical lab called MD reported place patient back on Bipap. Failed attempt on High flow o2 [place].   Blood gas, arterial: Patient Communication   05/15/2020 4:49 PM   Not seen  Encounter  View Encounter       Result Information  Flag: CriticalPanic  Status: Final result (Collected: 05/15/2020 15:30) Provider Status: Ordered    Lab Information  [facility] [facility]    Order-Level Documents:  There are no order-level documents. View SmartLink Info  Blood gas, arterial (Order [id number]) on 05/15/20  Result Read / Acknowledged  Acknowledge result No acknowledgement history exists for this order.   Blood gas, arterial: Patient Communication   05/15/2020 1535  Not seen

## 2020-05-15 NOTE — Progress Notes (Signed)
PCCM Mickel Baas Gleason NP notified of ABG values. Ordered to continue BiPap.

## 2020-05-15 NOTE — Progress Notes (Signed)
Patient gotten oob sat for approximately 4 hours in gerichair with family at bedside watching her. She took few steps with walker plus 2 assist rolling walker to the chair and then back to the bed when she went back to the bed. She did not do as well going back to bed tried sitting down too soon very tired from sitting up. She adventitially scooted up in the bed after much prompting from staff. She is notably more confused this evening very sleepy her ABG revealed pCO2 level 94.5 she required being placed back on her BiPAP, Husband was updated on her status aware. She also received Kayexalate for critical K+ level 5.6 today. No further changes noted.

## 2020-05-15 NOTE — Plan of Care (Signed)

## 2020-05-15 NOTE — Progress Notes (Signed)
ABG sent to lab, Estill Bamberg from lab was called & notified.

## 2020-05-15 NOTE — Progress Notes (Signed)
   05/15/20 0736  Assess: MEWS Score  Pulse Rate (!) 102  Resp (!) 26  SpO2 92 %  Assess: MEWS Score  MEWS Temp 0  MEWS Systolic 0  MEWS Pulse 1  MEWS RR 2  MEWS LOC 0  MEWS Score 3  MEWS Score Color Yellow  Assess: if the MEWS score is Yellow or Red  Were vital signs taken at a resting state? Yes  Focused Assessment Documented focused assessment  Early Detection of Sepsis Score *See Row Information* Low  MEWS guidelines implemented *See Row Information* No, previously yellow, continue vital signs every 4 hours  Treat  MEWS Interventions Administered scheduled meds/treatments  Notify: Charge Nurse/RN  Name of Charge Nurse/RN Notified  (already aware)  Notify: Provider  Provider Name/Title  (aware on MD rounds this am.)  Notify: Rapid Response  Name of Rapid Response RN Notified not this time  Document  Patient Outcome Other (Comment) (cont breathing tx's and o2 therapies.)  Progress note created (see row info) Yes

## 2020-05-16 DIAGNOSIS — J9612 Chronic respiratory failure with hypercapnia: Secondary | ICD-10-CM

## 2020-05-16 DIAGNOSIS — J9611 Chronic respiratory failure with hypoxia: Secondary | ICD-10-CM

## 2020-05-16 LAB — BASIC METABOLIC PANEL
Anion gap: 7 (ref 5–15)
BUN: 50 mg/dL — ABNORMAL HIGH (ref 8–23)
CO2: 41 mmol/L — ABNORMAL HIGH (ref 22–32)
Calcium: 9.6 mg/dL (ref 8.9–10.3)
Chloride: 98 mmol/L (ref 98–111)
Creatinine, Ser: 1.13 mg/dL — ABNORMAL HIGH (ref 0.44–1.00)
GFR calc Af Amer: 57 mL/min — ABNORMAL LOW (ref >60)
GFR calc non Af Amer: 49 mL/min — ABNORMAL LOW (ref >60)
Glucose, Bld: 156 mg/dL — ABNORMAL HIGH (ref 70–99)
Potassium: 5 mmol/L (ref 3.5–5.1)
Sodium: 146 mmol/L — ABNORMAL HIGH (ref 135–145)

## 2020-05-16 LAB — GLUCOSE, CAPILLARY
Glucose-Capillary: 152 mg/dL — ABNORMAL HIGH (ref 70–99)
Glucose-Capillary: 179 mg/dL — ABNORMAL HIGH (ref 70–99)
Glucose-Capillary: 155 mg/dL — ABNORMAL HIGH (ref 70–99)
Glucose-Capillary: 182 mg/dL — ABNORMAL HIGH (ref 70–99)

## 2020-05-16 LAB — BLOOD GAS, ARTERIAL
Acid-Base Excess: 16 mmol/L — ABNORMAL HIGH (ref 0.0–2.0)
Bicarbonate: 42.5 mmol/L — ABNORMAL HIGH (ref 20.0–28.0)
Drawn by: 511331
FIO2: 36
O2 Saturation: 96.1 %
Patient temperature: 37.1
pCO2 arterial: 81.8 mmHg (ref 32.0–48.0)
pH, Arterial: 7.337 — ABNORMAL LOW (ref 7.350–7.450)
pO2, Arterial: 83.1 mmHg (ref 83.0–108.0)

## 2020-05-16 LAB — CULTURE, BLOOD (ROUTINE X 2)
Culture: NO GROWTH
Culture: NO GROWTH
Special Requests: ADEQUATE
Special Requests: ADEQUATE

## 2020-05-16 LAB — MAGNESIUM: Magnesium: 2.3 mg/dL (ref 1.7–2.4)

## 2020-05-16 LAB — BRAIN NATRIURETIC PEPTIDE: B Natriuretic Peptide: 938.2 pg/mL — ABNORMAL HIGH (ref 0.0–100.0)

## 2020-05-16 MED ORDER — LEVALBUTEROL HCL 1.25 MG/0.5ML IN NEBU: 0.6300 mg | INHALATION_SOLUTION | Freq: Four times a day (QID) | RESPIRATORY_TRACT | Status: DC | PRN

## 2020-05-16 MED ORDER — FUROSEMIDE 10 MG/ML IJ SOLN
40.0000 mg | Freq: Two times a day (BID) | INTRAMUSCULAR | Status: DC
  Administered 2020-05-16 – 2020-05-20 (×8): 40 mg via INTRAVENOUS
  Filled 2020-05-16 (×8): qty 4

## 2020-05-16 MED ORDER — ACETAZOLAMIDE 250 MG PO TABS
250.0000 mg | ORAL_TABLET | Freq: Every day | ORAL | Status: AC
  Administered 2020-05-16 – 2020-05-18 (×3): 250 mg via ORAL
  Filled 2020-05-16 (×3): qty 1

## 2020-05-16 MED ORDER — MOMETASONE FURO-FORMOTEROL FUM 200-5 MCG/ACT IN AERO
2.0000 | INHALATION_SPRAY | Freq: Two times a day (BID) | RESPIRATORY_TRACT | Status: DC
  Administered 2020-05-16 – 2020-05-20 (×8): 2 via RESPIRATORY_TRACT
  Filled 2020-05-16: qty 8.8

## 2020-05-16 NOTE — Progress Notes (Signed)
Critical lab: pCO2 81.8  MD notified, pt currently on BIPAP for the night.

## 2020-05-16 NOTE — TOC Initial Note (Signed)
Transition of Care [facility]) - Initial/Assessment Note    Patient Details  Name: Amy Riddle MRN: [id number] Date of Birth: [birth year]  Transition of [facility]) CM/SW Contact:    Jacquelynn Cree Phone Number: 05/16/2020, 5:07 PM  Clinical Narrative:                 CSW received consult for possible SNF placement at discharge. Due to patient's orientation, CSW contacted patient's spouse Elenore Rota to PT recommendation. Spouse expressed understanding of PT recommendation and is agreeable to SNF placement at discharge. Spouse expressed he is recovering from a neck injury and would not be able to provide the proper support. Spouse expressed a preference for [facility] and provided permission for referrals to be faxed out to [facility].. Patient have been fully vaccinated since March 2021. No further questions expressed at this time. CSW will continue to follow.  Expected Discharge Plan: Skilled Nursing Facility Barriers to Discharge: Continued Medical Work up, Ship broker   Patient Goals and CMS Choice   CMS Medicare.gov Compare Post Acute Care list provided to:: Patient Represenative (must comment) Elenore Rota) Choice offered to / list presented to : Spouse  Expected Discharge Plan and Services Expected Discharge Plan: [place] In-house Referral: Clinical Social Work     Living arrangements for the past 2 months: Single Family Home                                      Prior Living Arrangements/Services Living arrangements for the past 2 months: Single Family Home Lives with:: Spouse Patient language and need for interpreter reviewed:: Yes Do you feel safe going back to the place where you live?: Yes      Need for Family Participation in Patient Care: Yes (Comment) Care giver support system in place?: Yes (comment)   Criminal Activity/Legal Involvement Pertinent to Current Situation/Hospitalization: No - Comment as  needed  Activities of Daily Living Home Assistive Devices/Equipment: Cane (specify quad or straight), CBG Meter, CPAP ADL Screening (condition at time of admission) Patient's cognitive ability adequate to safely complete daily activities?: Yes Is the patient deaf or have difficulty hearing?: No Does the patient have difficulty seeing, even when wearing glasses/contacts?: No Does the patient have difficulty concentrating, remembering, or making decisions?: No Patient able to express need for assistance with ADLs?: Yes Does the patient have difficulty dressing or bathing?: No Independently performs ADLs?: Yes (appropriate for developmental age) Does the patient have difficulty walking or climbing stairs?: No Weakness of Legs: Both Weakness of Arms/Hands: Both  Permission Sought/Granted Permission sought to share information with : Facility Sport and exercise psychologist, Family Supports    Share Information with NAME: Elenore Rota  Permission granted to share info w AGENCY: SNFs  Permission granted to share info w Relationship: Spouse  Permission granted to share info w Contact Information: [phone]  Emotional Assessment   Attitude/Demeanor/Rapport: Unable to Assess Affect (typically observed): Unable to Assess Orientation: : Oriented to Self Alcohol / Substance Use: Not Applicable Psych Involvement: No (comment)  Admission diagnosis:  Respiratory distress [R06.03] Pleural effusion [J90] Acute CHF (congestive heart failure) (HCC) [I50.9] AKI (acute kidney injury) ([place]) [N17.9] Patient Active Problem List   Diagnosis Date Noted  . Cirrhosis ([place])   . Acute CHF (congestive heart failure) ([place]) 05/10/2020  . Hypotension 05/10/2020  . AKI (acute kidney injury) ([place]) 05/10/2020  . Permanent atrial fibrillation ([place]) 05/10/2020  .  Atrial fibrillation ([place]) 08/28/2019  . Fibroid uterus 04/22/2013  . Postmenopausal bleeding 04/22/2013  . HTN (hypertension) 03/26/2013  . Diabetes ([place])  03/26/2013  . Morbid obesity ([place]) 03/26/2013   PCP:  Leonard Downing, MD Pharmacy:   [id number] - [place], [place] [address] Phone: [phone] Fax: [phone]     Social Determinants of Health (SDOH) Interventions    Readmission Risk Interventions No flowsheet data found.

## 2020-05-16 NOTE — Evaluation (Signed)
Physical Therapy Evaluation Patient Details Name: Amy Riddle MRN: [id number] DOB: 04-09-1946 Today's Date: 05/16/2020   History of Present Illness  74 yo female presented with worsening dyspnea for 2 weeks with productive cough, wheezing, leg edema with concern for asthma and CHF exacerbation.  Clinical Impression  Pt admitted with above diagnosis. Pt was able to take pivotal steps to the recliner from bed with min assist and cues.  Pt O2 sats on 3L dropped to 86% and would not improve with time in chair therefore nurse bumped O2 to 4L and sats >90%.  Will follow acutely.  Pt currently with functional limitations due to the deficits listed below (see PT Problem List). Pt will benefit from skilled PT to increase their independence and safety with mobility to allow discharge to the venue listed below.      Follow Up Recommendations SNF;Supervision/Assistance - 24 hour    Equipment Recommendations  None recommended by PT    Recommendations for Other Services       Precautions / Restrictions Precautions Precautions: Fall Restrictions Weight Bearing Restrictions: No      Mobility  Bed Mobility Overal bed mobility: Needs Assistance Bed Mobility: Supine to Sit     Supine to sit: Min assist     General bed mobility comments: a little assist to initiated LEs and trunk  Transfers Overall transfer level: Needs assistance Equipment used: Rolling walker (2 wheeled) Transfers: Sit to/from [facility] Sit to Stand: Min guard;From elevated surface;+2 safety/equipment Stand pivot transfers: Min assist;+2 safety/equipment;From elevated surface       General transfer comment: Sats 92% on 3L on arrival. Pt took pivotal steps to chair with min assist and cues.  Somewhat impulsive in sitting. Desat to 86% on 3L and would not recover therefore nurse turned O2 to 4L and sats >90%.    Ambulation/Gait             General Gait Details: TBA  Stairs             Wheelchair Mobility    Modified Rankin (Stroke Patients Only)       Balance Overall balance assessment: Needs assistance Sitting-balance support: No upper extremity supported;Feet supported Sitting balance-Leahy Scale: Fair     Standing balance support: Bilateral upper extremity supported;During functional activity Standing balance-Leahy Scale: Poor Standing balance comment: relies on UE support for balance.                              Pertinent Vitals/Pain Pain Assessment: No/denies pain    Home Living Family/patient expects to be discharged to:: Private residence Living Arrangements: Spouse/significant other Available Help at Discharge: Family;Available PRN/intermittently (husband works nights) Type of Home: House Home Access: Stairs to enter Entrance Stairs-Rails: Right Entrance Stairs-Number of Steps: 4 Home Layout: One level Home Equipment: Cane - single point;Walker - 2 wheels;Bedside commode;Shower seat      Prior Function Level of Independence: Independent with assistive device(s)         Comments: used cane per pt     Hand Dominance   Dominant Hand: Right    Extremity/Trunk Assessment   Upper Extremity Assessment Upper Extremity Assessment: Defer to OT evaluation    Lower Extremity Assessment Lower Extremity Assessment: Generalized weakness    Cervical / Trunk Assessment Cervical / Trunk Assessment: Normal  Communication   Communication: No difficulties  Cognition Arousal/Alertness: Awake/alert Behavior During Therapy: Anxious;Impulsive Overall Cognitive Status: Impaired/Different from baseline Area  of Impairment: Orientation;Memory;Following commands;Safety/judgement;Problem solving                 Orientation Level: Place;Time;Situation   Memory: Decreased short-term memory Following Commands: Follows one step commands inconsistently;Follows one step commands with increased time Safety/Judgement: Decreased  awareness of safety;Decreased awareness of deficits   Problem Solving: Slow processing;Requires verbal cues General Comments: Confused intermittently      General Comments General comments (skin integrity, edema, etc.): 92 bpm, 89% 3L with talking, 92% on 3L at rest without talking.  30, 127/82    Exercises General Exercises - Lower Extremity Ankle Circles/Pumps: AROM;Both;10 reps;Seated Long Arc Quad: AROM;Both;10 reps;Seated   Assessment/Plan    PT Assessment Patient needs continued PT services  PT Problem List Decreased activity tolerance;Decreased balance;Decreased mobility;Decreased knowledge of use of DME;Decreased safety awareness;Decreased knowledge of precautions;Cardiopulmonary status limiting activity       PT Treatment Interventions DME instruction;Gait training;Functional mobility training;Therapeutic activities;Therapeutic exercise;Balance training;Patient/family education    PT Goals (Current goals can be found in the Care Plan section)  Acute Rehab PT Goals Patient Stated Goal: to go home PT Goal Formulation: With patient Time For Goal Achievement: 05/30/20 Potential to Achieve Goals: Good    Frequency Min 3X/week   Barriers to discharge Decreased caregiver support      Co-evaluation               AM-PAC PT "6 Clicks" Mobility  Outcome Measure Help needed turning from your back to your side while in a flat bed without using bedrails?: A Little Help needed moving from lying on your back to sitting on the side of a flat bed without using bedrails?: A Little Help needed moving to and from a bed to a chair (including a wheelchair)?: A Lot Help needed standing up from a chair using your arms (e.g., wheelchair or bedside chair)?: A Lot Help needed to walk in hospital room?: Total Help needed climbing 3-5 steps with a railing? : Total 6 Click Score: 12    End of Session Equipment Utilized During Treatment: Gait belt;Oxygen Activity Tolerance: Patient  limited by fatigue Patient left: in chair;with call bell/phone within reach;with chair alarm set Nurse Communication: Mobility status PT Visit Diagnosis: Muscle weakness (generalized) (M62.81);Other abnormalities of gait and mobility (R26.89)    Time: 0973-5329 PT Time Calculation (min) (ACUTE ONLY): 20 min   Charges:   PT Evaluation $PT Eval Moderate Complexity: 1 Mod          Kamela Blansett W,PT Acute Rehabilitation Services Pager:  [phone]  Office:  [place] 05/16/2020, 2:36 PM

## 2020-05-16 NOTE — Progress Notes (Signed)
NAME:  Amy Riddle, MRN:  [id number], DOB:  [birth year], LOS: 6 ADMISSION DATE:  05/10/2020, CONSULTATION DATE: 05/11/20  REFERRING MD:  Dr. Kurtis Bushman / [facility] , CHIEF COMPLAINT:  SOB  Brief History   74 yo female presented with worsening dyspnea for 2 weeks with productive cough, wheezing, leg edema with concern for asthma and CHF exacerbation.  Past Medical History  A-Fib - on eliquis, cardizem.  Has required cardioversion   HTN - on losartan, lasix.  Prior LVEF 40-45% Asthma - on advair  DM II - on metformin  Morbid Obesity  Former Smoker - quit [facility] Events   6/08 Admit  6/09 PCCM consulted   Consults:    Procedures:    Significant Diagnostic Tests:   Limited ECHO 6/9 >> LVEF ~ 40-45%, LV mildly decreased function, LV demonstrates global hypokinesis, interventricular septum flattened in systole consistent with RV pressure overload, RV systolic function moderately reduced, RV is severely enlarged, LA mildly dilated, RA severely dilated  Renal US 6/9 >> no mass or hydronephrosis, small amount left-sided perinephric fluid  V/Q scan 6/11 >> low probability for PE  Micro Data:  COVID 6/8 >> negative   Antimicrobials:  Rocephin 6/9 >>  Azithro 6/9 >> 6/11  Interim history/subjective:   She wore BiPAP overnight last night, some difficulty with the mask and tolerating but she did wear it. Less edema, states that her breathing is improving  Objective   Blood pressure (!) 132/93, pulse 95, temperature 98.4 F (36.9 C), temperature source Oral, resp. rate 20, height 5' 2.5" (1.588 m), weight 124.2 kg, SpO2 96 %.    FiO2 (%):  [50 %] 50 %   Intake/Output Summary (Last 24 hours) at 05/16/2020 0846 Last data filed at 05/16/2020 0500 Gross per 24 hour  Intake 1416.37 ml  Output 2100 ml  Net -683.63 ml   Filed Weights   05/13/20 0416 05/14/20 0612 05/15/20 0108  Weight: 125.5 kg 124.7 kg 124.2 kg    Examination:  General -chronically  ill-appearing obese woman, laying in bed comfortably nasal cannula O2 Eyes -pupils react ENT -no stridor, upper airway secretions Cardiac -irregularly irregular Chest -decreased bilateral breath sounds, no crackles, no wheeze Abdomen -obese, nondistended, positive bowel sounds Extremities -trace bilateral pretibial edema (improved) Skin -no rash Neuro -awake, alert, interacts appropriately, answers questions, follows commands  Resolved Hospital Problem list      Assessment & Plan:   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 -Currently on Pulmicort, Brovana.  Should be able to transition back to [facility] (pharmacy substitute for her home Advair), and then either Dulera or Advair when time for discharge depending on her preference -Continue submental oxygen, goal SPO2 greater than 88% -Continue BiPAP mandatory nightly.  I talked to her today about the benefits of wearing this.  I also talked to her about having it chronically at home.  Her ABG 6/13 p.m. confirms chronic hypercapnia.  She would qualify for BiPAP based on the diagnosis of chronic hypercapnic respiratory failure. -She has benefited from diuretics, has a mild evolving contraction alkalosis.  I will give her Diamox for 3 days to try to correct this, should help bring down her compensated PCO2.  Would not use Diamox as a chronic medication -She will need pulmonary/sleep medicine follow-up as an outpatient  Permanent A fib, acute on chronic combined CHF, HTN. -Diuresis as mentioned above.  Temporary Diamox as mentioned above -Eliquis  Urinary tract infection -Should be able to stop ceftriaxone  Best practice:  Diet: heart healthy DVT prophylaxis: eliquis Mobility: as tolerated  Code Status: DNR  Disposition: Progressive   Labs    CMP Latest Ref Rng & Units 05/16/2020 05/15/2020 05/14/2020  Glucose 70 - 99 mg/dL 156(H) 147(H) 161(H)   BUN 8 - 23 mg/dL 50(H) 64(H) 81(H)  Creatinine 0.44 - 1.00 mg/dL 1.13(H) 1.18(H) 1.41(H)  Sodium 135 - 145 mmol/L 146(H) 145 144  Potassium 3.5 - 5.1 mmol/L 5.0 5.6(H) 5.3(H)  Chloride 98 - 111 mmol/L 98 101 99  CO2 22 - 32 mmol/L 41(H) 37(H) 36(H)  Calcium 8.9 - 10.3 mg/dL 9.6 9.7 9.9  Total Protein 6.5 - 8.1 g/dL - - -  Total Bilirubin 0.3 - 1.2 mg/dL - - -  Alkaline Phos 38 - 126 U/L - - -  AST 15 - 41 U/L - - -  ALT 0 - 44 U/L - - -    CBC Latest Ref Rng & Units 05/12/2020 05/10/2020 10/01/2019  WBC 4.0 - 10.5 K/uL 9.9 13.1(H) 7.8  Hemoglobin 12.0 - 15.0 g/dL 15.2(H) 16.3(H) 15.8  Hematocrit 36 - 46 % 51.0(H) 52.5(H) 49.3(H)  Platelets 150 - 400 K/uL 249 344 235    ABG    Component Value Date/Time   PHART 7.360 05/15/2020 2045   PCO2ART 75.1 (HH) 05/15/2020 2045   PO2ART 84.5 05/15/2020 2045   HCO3 41.5 (H) 05/15/2020 2045   O2SAT 97.5 05/15/2020 2045    CBG (last 3)  Recent Labs    05/15/20 1558 05/15/20 2123 05/16/20 0631  GLUCAP 125* 152* 152*     Signature:   Baltazar Apo, MD, PhD 05/16/2020, 8:53 AM [facility] [phone] or if no answer [phone]

## 2020-05-16 NOTE — NC FL2 (Signed)
[place] LEVEL OF CARE SCREENING TOOL     IDENTIFICATION  Patient Name: Amy Riddle Birthdate: 1946-02-09 Sex: female Admission Date (Current Location): 05/10/2020  [facility] and [place] Number:  Herbalist and Address:  [facility], [place] [address] [place],  [address]      Provider Number: [id number]  Attending Physician Name and Address:  Nolberto Hanlon, MD  Relative Name and Phone Number:  Sissi Padia    Current Level of Care: Hospital Recommended Level of Care: [place] Prior Approval Number:    Date Approved/Denied:   PASRR Number: [id number]  Discharge Plan: SNF    Current Diagnoses: Patient Active Problem List   Diagnosis Date Noted  . Cirrhosis ([place])   . Acute CHF (congestive heart failure) ([place]) 05/10/2020  . Hypotension 05/10/2020  . AKI (acute kidney injury) ([place]) 05/10/2020  . Permanent atrial fibrillation ([place]) 05/10/2020  . Atrial fibrillation ([place]) 08/28/2019  . Fibroid uterus 04/22/2013  . Postmenopausal bleeding 04/22/2013  . HTN (hypertension) 03/26/2013  . Diabetes ([place]) 03/26/2013  . Morbid obesity ([place]) 03/26/2013    Orientation RESPIRATION BLADDER Height & Weight     Self  O2 (Nasal Cannula) Incontinent, External catheter Weight: 275 lb 9.2 oz (125 kg) Height:  5' 2.5" (158.8 cm)  BEHAVIORAL SYMPTOMS/MOOD NEUROLOGICAL BOWEL NUTRITION STATUS      Incontinent    AMBULATORY STATUS COMMUNICATION OF NEEDS Skin   Limited Assist Verbally Normal                       Personal Care Assistance Level of Assistance  Bathing, Feeding, Dressing Bathing Assistance: Limited assistance Feeding assistance: Limited assistance Dressing Assistance: Limited assistance     Functional Limitations Info  Sight, Hearing, Speech Sight Info: Impaired Hearing Info: Adequate Speech Info: Adequate    SPECIAL CARE FACTORS FREQUENCY  PT (By licensed PT), OT (By licensed OT)      PT Frequency: 5x a week OT Frequency: 5x a week            Contractures Contractures Info: Not present    Additional Factors Info  Code Status, Allergies Code Status Info: DNR Allergies Info: Tylenol Arthritis Ext (acetaminophen)           Current Medications (05/16/2020):  This is the current hospital active medication list Current Facility-Administered Medications  Medication Dose Route Frequency Provider Last Rate Last Admin  . 0.9 %  sodium chloride infusion  250 mL Intravenous PRN Tu, Ching T, DO      . acetaminophen (TYLENOL) tablet 650 mg  650 mg Oral Q4H PRN Tu, Ching T, DO   650 mg at 05/12/20 0249  . acetaZOLAMIDE (DIAMOX) tablet 250 mg  250 mg Oral Daily Collene Gobble, MD   250 mg at 05/16/20 0908  . apixaban (ELIQUIS) tablet 5 mg  5 mg Oral BID Tu, Ching T, DO   5 mg at 05/16/20 2409  . cefTRIAXone (ROCEPHIN) 1 g in sodium chloride 0.9 % 100 mL IVPB  1 g Intravenous Q24H Candee Furbish, MD 200 mL/hr at 05/16/20 1500 1 g at 05/16/20 1500  . chlorhexidine (PERIDEX) 0.12 % solution 15 mL  15 mL Mouth Rinse BID Nolberto Hanlon, MD   15 mL at 05/16/20 0844  . Chlorhexidine Gluconate Cloth 2 % PADS 6 each  6 each Topical Daily Nolberto Hanlon, MD   6 each at 05/16/20 0908  . diltiazem (CARDIZEM CD)  24 hr capsule 360 mg  360 mg Oral Daily Tu, Ching T, DO   360 mg at 05/16/20 0809  . feeding supplement (NEPRO CARB STEADY) liquid 237 mL  237 mL Oral TID WC Nolberto Hanlon, MD   237 mL at 05/16/20 1538  . furosemide (LASIX) injection 40 mg  40 mg Intravenous BID Nolberto Hanlon, MD      . insulin aspart (novoLOG) injection 0-9 Units  0-9 Units Subcutaneous TID WC Nolberto Hanlon, MD   2 Units at 05/16/20 1158  . levalbuterol (XOPENEX) nebulizer solution 0.63 mg  0.63 mg Nebulization Q6H PRN Collene Gobble, MD      . MEDLINE mouth rinse  15 mL Mouth Rinse q12n4p Nolberto Hanlon, MD   15 mL at 05/16/20 1530  . metoprolol tartrate (LOPRESSOR) tablet 12.5 mg  12.5 mg Oral Q6H Nolberto Hanlon, MD    12.5 mg at 05/16/20 1529  . mometasone-formoterol (DULERA) 200-5 MCG/ACT inhaler 2 puff  2 puff Inhalation BID Collene Gobble, MD      . multivitamin with minerals tablet 1 tablet  1 tablet Oral Daily Nolberto Hanlon, MD   1 tablet at 05/16/20 0809  . ondansetron (ZOFRAN) injection 4 mg  4 mg Intravenous Q6H PRN Tu, Ching T, DO   4 mg at 05/14/20 1502  . sodium chloride flush (NS) 0.9 % injection 3 mL  3 mL Intravenous Q12H Tu, Ching T, DO   3 mL at 05/16/20 0810  . sodium chloride flush (NS) 0.9 % injection 3 mL  3 mL Intravenous PRN Tu, Ching T, DO         Discharge Medications: Please see discharge summary for a list of discharge medications.  Relevant Imaging Results:  Relevant Lab Results:   Additional Information [id number]  Neysa Hotter [place], [place]

## 2020-05-16 NOTE — Progress Notes (Addendum)
PROGRESS NOTE    Amy Riddle  QJF:[id number] DOB: [birth year] DOA: 05/10/2020 PCP: Leonard Downing, MD    Brief Narrative:  Amy Riddle is a 74 y.o. female with medical history significant for Hx of intermittent asthma, HTN, permanent atrial fibrillation on Eliquis, Type 2 DM, and morbid obesity who presents with worsening shortness of breath.   Patient reports that she has been having ongoing shortness of breath that is worse this week.  Worse especially with exertion and better at rest.  Had to increase to sleeping on 2 pillows.  Has had wheezing, increased cough with sputum production.  No fever.  Also noted bilateral lower extremity edema.  She does not have a formal diagnosis of CHF.  She was evaluated by her PCP about 4 days ago and had increases to her Advair and increase of her Lasix from 40 mg to 60 mg.  However she did not feel like she had increased urine output from that.  Patient presented presented to ED on CPAP and was found to have O2 saturation in 70s.  Not able to wean down to a nonrebreather on 4 L.  Has leukocytosis of 13.1. Creatinine elevated to 2.69 from a prior of 0.70. Elevated bilirubin of 1.7. Anion gap of 18. BNP of 605.4.   CXR shows progressive right pleural effusion with increasing fluid and cardiomegaly with vascular congestion.  She was given Solu-Medrol, DuoNeb and 40 mg Lasix in the ED.   Consultants:   pccm  Procedures:  Antimicrobials:   ceftiaxone and azithromycin   Subjective: On nasal cannula this a.m. lying in bed oriented to date and person but not place.  Denies worsening shortness of breath, chest pain, or dizziness   Objective: Vitals:   05/16/20 0800 05/16/20 0900 05/16/20 1115 05/16/20 1530  BP:   127/82   Pulse:   95   Resp: 20  20 20   Temp: 98.4 F (36.9 C)  98 F (36.7 C) 98.4 F (36.9 C)  TempSrc: Oral  Oral Oral  SpO2:   93%   Weight:  125 kg    Height:        Intake/Output Summary (Last  24 hours) at 05/16/2020 1534 Last data filed at 05/16/2020 0500 Gross per 24 hour  Intake 816.37 ml  Output 1950 ml  Net -1133.63 ml   Filed Weights   05/14/20 0612 05/15/20 0108 05/16/20 0900  Weight: 124.7 kg 124.2 kg 125 kg    Examination:  General exam: appears well, laying in bed, on restraints and on BiPAP respiratory system: Early cta, no w/r/r  Cardiovascular system: S1 & S2 heard, RRR. No JVD, murmurs, rubs, gallops or clicks.  Gastrointestinal system: Abdomen is nondistended, soft and nontender. Normal bowel sounds heard.  No guarding Central nervous system:  Awake  Oriented to time, person, not place. Extremities: trace edema b/lr.  Skin: Warm dry Psychiatry: mood acceptable for current setting.    Data Reviewed: I have personally reviewed following labs and imaging studies  CBC: Recent Labs  Lab 05/10/20 2010 05/12/20 0440  WBC 13.1* 9.9  NEUTROABS 10.3*  --   HGB 16.3* 15.2*  HCT 52.5* 51.0*  MCV 87.5 90.3  PLT 344 893   Basic Metabolic Panel: Recent Labs  Lab 05/12/20 0440 05/13/20 0735 05/14/20 0451 05/15/20 0715 05/16/20 0739  NA 137 140 144 145 146*  K 4.5 5.2* 5.3* 5.6* 5.0  CL 95* 95* 99 101 98  CO2 32 35* 36* 37* 41*  GLUCOSE  173* 176* 161* 147* 156*  BUN 95* 97* 81* 64* 50*  CREATININE 2.00* 1.78* 1.41* 1.18* 1.13*  CALCIUM 9.1 9.6 9.9 9.7 9.6  MG 2.1 2.6* 2.8* 2.7* 2.3  PHOS  --   --  2.9  --   --    GFR: Estimated Creatinine Clearance: 58.2 mL/min (A) (by C-G formula based on SCr of 1.13 mg/dL (H)). Liver Function Tests: Recent Labs  Lab 05/10/20 2010  AST 22  ALT 36  ALKPHOS 49  BILITOT 1.7*  PROT 6.5  ALBUMIN 3.7   No results for input(s): LIPASE, AMYLASE in the last 168 hours. No results for input(s): AMMONIA in the last 168 hours. Coagulation Profile: No results for input(s): INR, PROTIME in the last 168 hours. Cardiac Enzymes: No results for input(s): CKTOTAL, CKMB, CKMBINDEX, TROPONINI in the last 168 hours. BNP  (last 3 results) No results for input(s): PROBNP in the last 8760 hours. HbA1C: No results for input(s): HGBA1C in the last 72 hours. CBG: Recent Labs  Lab 05/15/20 1214 05/15/20 1558 05/15/20 2123 05/16/20 0631 05/16/20 1113  GLUCAP 176* 125* 152* 152* 179*   Lipid Profile: No results for input(s): CHOL, HDL, LDLCALC, TRIG, CHOLHDL, LDLDIRECT in the last 72 hours. Thyroid Function Tests: No results for input(s): TSH, T4TOTAL, FREET4, T3FREE, THYROIDAB in the last 72 hours. Anemia Panel: No results for input(s): VITAMINB12, FOLATE, FERRITIN, TIBC, IRON, RETICCTPCT in the last 72 hours. Sepsis Labs: No results for input(s): PROCALCITON, LATICACIDVEN in the last 168 hours.  Recent Results (from the past 240 hour(s))  SARS Coronavirus 2 by RT PCR (hospital order, performed in [facility] hospital lab) Nasopharyngeal Nasopharyngeal Swab     Status: None   Collection Time: 05/10/20  8:31 PM   Specimen: Nasopharyngeal Swab  Result Value Ref Range Status   SARS Coronavirus 2 NEGATIVE NEGATIVE Final    Comment: (NOTE) SARS-CoV-2 target nucleic acids are NOT DETECTED. The SARS-CoV-2 RNA is generally detectable in upper and lower respiratory specimens during the acute phase of infection. The lowest concentration of SARS-CoV-2 viral copies this assay can detect is 250 copies / mL. A negative result does not preclude SARS-CoV-2 infection and should not be used as the sole basis for treatment or other patient management decisions.  A negative result may occur with improper specimen collection / handling, submission of specimen other than nasopharyngeal swab, presence of viral mutation(s) within the areas targeted by this assay, and inadequate number of viral copies (<250 copies / mL). A negative result must be combined with clinical observations, patient history, and epidemiological information. Fact Sheet for Patients:   StrictlyIdeas.no Fact Sheet for  Healthcare Providers: BankingDealers.co.za This test is not yet approved or cleared  by the [place] FDA and has been authorized for detection and/or diagnosis of SARS-CoV-2 by FDA under an Emergency Use Authorization (EUA).  This EUA will remain in effect (meaning this test can be used) for the duration of the COVID-19 declaration under Section 564(b)(1) of the Act, 21 U.S.C. section 360bbb-3(b)(1), unless the authorization is terminated or revoked sooner. Performed at [facility], [place] [address], [place],  [address]   Culture, Urine     Status: Abnormal   Collection Time: 05/11/20  1:58 AM   Specimen: Urine, Catheterized  Result Value Ref Range Status   Specimen Description URINE, CATHETERIZED  Final   Special Requests ADDED  Final   Culture (A)  Final    >=100,000 COLONIES/mL GROUP B STREP(S.AGALACTIAE)ISOLATED TESTING AGAINST S.  AGALACTIAE NOT ROUTINELY PERFORMED DUE TO PREDICTABILITY OF AMP/PEN/VAN SUSCEPTIBILITY. Performed at [facility], [place] [address], [place], [place] [address]    Report Status 05/13/2020 FINAL  Final  Culture, blood (Routine X 2) w Reflex to ID Panel     Status: None   Collection Time: 05/11/20  3:20 PM   Specimen: BLOOD  Result Value Ref Range Status   Specimen Description BLOOD RIGHT ANTECUBITAL  Final   Special Requests   Final    BOTTLES DRAWN AEROBIC AND ANAEROBIC Blood Culture adequate volume   Culture   Final    NO GROWTH 5 DAYS Performed at [facility], [place] [address], [place], [place] [address]    Report Status 05/16/2020 FINAL  Final  Culture, blood (Routine X 2) w Reflex to ID Panel     Status: None   Collection Time: 05/11/20  3:20 PM   Specimen: BLOOD RIGHT HAND  Result Value Ref Range Status   Specimen Description BLOOD RIGHT HAND  Final   Special Requests   Final    BOTTLES DRAWN AEROBIC ONLY Blood Culture adequate volume   Culture   Final    NO GROWTH 5 DAYS Performed at [facility], [place] [address] [place], [place] [address]    Report Status 05/16/2020 FINAL  Final         Radiology Studies: No results found.      Scheduled Meds:  acetaZOLAMIDE  250 mg Oral Daily   apixaban  5 mg Oral BID   chlorhexidine  15 mL Mouth Rinse BID   Chlorhexidine Gluconate Cloth  6 each Topical Daily   diltiazem  360 mg Oral Daily   feeding supplement (NEPRO CARB STEADY)  237 mL Oral TID WC   furosemide  20 mg Intravenous BID   insulin aspart  0-9 Units Subcutaneous TID WC   mouth rinse  15 mL Mouth Rinse q12n4p   metoprolol tartrate  12.5 mg Oral Q6H   mometasone-formoterol  2 puff Inhalation BID   multivitamin with minerals  1 tablet Oral Daily   sodium chloride flush  3 mL Intravenous Q12H   Continuous Infusions:  sodium chloride     cefTRIAXone (ROCEPHIN)  IV 1 g (05/16/20 1500)    Assessment & Plan:   Principal Problem:   Acute CHF (congestive heart failure) (H[facility]) Active Problems:   Diabetes (H[facility])   Hypotension   AKI (acute kidney injury) ([place])   Permanent atrial fibrillation ([place])   Cirrhosis ([place])   Acute hypoxemic and hypercapnic respiratory failure -multifactoria. CHF and possible asthma exacerbation Last Echo is 45-50% in 08/2019.  Will repeat. Strict I's and O's.  Daily weights PRN Xopenex Pulmonary consulted, input was appreciated-held Lasix.  Started on IV steroids...>now discontinued as it may cause delirium -wean O2 for sats 90-95% -brovana + pulmicort BID -PRN xopenex  IV antibiotics empirically Rt pleural effusion may need ?thoracentesis . ABG elevated PCO2 , on Bipap (changes made by pccm) continue BiPAP QHS and PRN daytim-e sleep  EchO:EF 40-45%, rv ef mildly reduced, rv volume overload, rv enlarged, elevated PASP LE venous US: negative dvt VQ low probability of PE Plan: Increase lasix 40mg  iv bid Pulmonary input appreciated, added Diamox.  Agreement with increasing Lasix.  Trilogy is being set up for  the patient.   Hypotension Resolved.  AKI Likely from vascular congestion. Received lasix iv. Monitor closely, if no improvement will consult nephrology renal US: Small amount of left-sided perinephric fluid.  Improved, cr today 1.15  Hyperkalemia Given Kayexalate previously On low K diet. Continue to monitor   Permanent atrial fibrillation Nonsustained RVR , worsened by respiratory issues HR better with beta blk, will continue , increase as tolerated. Continue with Cardizem Continue Eliquis   Non-insulin-dependent type 2 diabetes Low-dose sliding scale  Goals of care: will consult palliative care   DVT prophylaxis: Eliquis Code Status: DNR Family Communication: called husband but did not pickup. Left vm I called Disposition Plan: Back to Home v.s. SNF Inpatient status Remains inpatient appropriate because:Inpatient level of care appropriate due to severity of illness  Dispo: The patient is from: Home  Anticipated d/c is to: Home  Anticipated d/c date is: > 3 days  Barrier:Patient currently is not medically stable to d/c.On bipap. Needs iv lasix, trilogy being setup. nees snf    LOS: 6 days   Time spent: 45 min with more than >50% on coc    Nolberto Hanlon, MD [facility] Pager [phone]  If 7PM-7AM, please contact night-coverage [email or web address] Password [facility] 05/16/2020, 3:34 PM

## 2020-05-17 DIAGNOSIS — J90 Pleural effusion, not elsewhere classified: Secondary | ICD-10-CM

## 2020-05-17 DIAGNOSIS — Z515 Encounter for palliative care: Secondary | ICD-10-CM

## 2020-05-17 DIAGNOSIS — J9612 Chronic respiratory failure with hypercapnia: Secondary | ICD-10-CM

## 2020-05-17 DIAGNOSIS — Z66 Do not resuscitate: Secondary | ICD-10-CM

## 2020-05-17 DIAGNOSIS — Z7189 Other specified counseling: Secondary | ICD-10-CM

## 2020-05-17 DIAGNOSIS — R0603 Acute respiratory distress: Secondary | ICD-10-CM

## 2020-05-17 DIAGNOSIS — I509 Heart failure, unspecified: Secondary | ICD-10-CM

## 2020-05-17 LAB — GLUCOSE, CAPILLARY
Glucose-Capillary: 139 mg/dL — ABNORMAL HIGH (ref 70–99)
Glucose-Capillary: 145 mg/dL — ABNORMAL HIGH (ref 70–99)
Glucose-Capillary: 139 mg/dL — ABNORMAL HIGH (ref 70–99)
Glucose-Capillary: 160 mg/dL — ABNORMAL HIGH (ref 70–99)

## 2020-05-17 LAB — BASIC METABOLIC PANEL
Anion gap: 7 (ref 5–15)
BUN: 43 mg/dL — ABNORMAL HIGH (ref 8–23)
CO2: 42 mmol/L — ABNORMAL HIGH (ref 22–32)
Calcium: 9.1 mg/dL (ref 8.9–10.3)
Chloride: 96 mmol/L — ABNORMAL LOW (ref 98–111)
Creatinine, Ser: 1.2 mg/dL — ABNORMAL HIGH (ref 0.44–1.00)
GFR calc Af Amer: 53 mL/min — ABNORMAL LOW (ref >60)
GFR calc non Af Amer: 45 mL/min — ABNORMAL LOW (ref >60)
Glucose, Bld: 148 mg/dL — ABNORMAL HIGH (ref 70–99)
Potassium: 4.4 mmol/L (ref 3.5–5.1)
Sodium: 145 mmol/L (ref 135–145)

## 2020-05-17 MED ORDER — LORAZEPAM 0.5 MG PO TABS
0.2500 mg | ORAL_TABLET | Freq: Two times a day (BID) | ORAL | Status: DC | PRN
  Administered 2020-05-17 – 2020-05-19 (×3): 0.25 mg via ORAL
  Filled 2020-05-17 (×3): qty 1

## 2020-05-17 MED ORDER — PRO-STAT SUGAR FREE PO LIQD
30.0000 mL | Freq: Two times a day (BID) | ORAL | Status: DC
  Administered 2020-05-17 – 2020-05-20 (×5): 30 mL via ORAL
  Filled 2020-05-17 (×6): qty 30

## 2020-05-17 MED ORDER — HALOPERIDOL LACTATE 5 MG/ML IJ SOLN: 1.0000 mg | Freq: Four times a day (QID) | INTRAMUSCULAR | Status: DC | PRN

## 2020-05-17 NOTE — Progress Notes (Signed)
PROGRESS NOTE    Amy Riddle  HUT:[id number] DOB: [birth year] DOA: 05/10/2020 PCP: Leonard Downing, MD    Brief Narrative:  Amy Riddle is a 74 y.o. female with medical history significant for Hx of intermittent asthma, HTN, permanent atrial fibrillation on Eliquis, Type 2 DM, and morbid obesity who presents with worsening shortness of breath.   Patient reports that she has been having ongoing shortness of breath that is worse this week.  Worse especially with exertion and better at rest.  Had to increase to sleeping on 2 pillows.  Has had wheezing, increased cough with sputum production.  No fever.  Also noted bilateral lower extremity edema.  She does not have a formal diagnosis of CHF.  She was evaluated by her PCP about 4 days ago and had increases to her Advair and increase of her Lasix from 40 mg to 60 mg.  However she did not feel like she had increased urine output from that.  Patient presented presented to ED on CPAP and was found to have O2 saturation in 70s.  Not able to wean down to a nonrebreather on 4 L.  Has leukocytosis of 13.1. Creatinine elevated to 2.69 from a prior of 0.70. Elevated bilirubin of 1.7. Anion gap of 18. BNP of 605.4.   CXR shows progressive right pleural effusion with increasing fluid and cardiomegaly with vascular congestion.  She was given Solu-Medrol, DuoNeb and 40 mg Lasix in the ED.   Consultants:   pccm  Procedures:  Antimicrobials:   ceftiaxone and azithromycin   Subjective: Sitting in bed.  Palliative care at bedside.  Patient reports feeling better.  Had issues using BiPAP last night.  Discussed compliance with BiPAP again with the patient.   Objective: Vitals:   05/17/20 0100 05/17/20 0321 05/17/20 0442 05/17/20 0500  BP:    127/79  Pulse:  94  99  Resp:  18  (!) 22  Temp: 98.5 F (36.9 C)  97.9 F (36.6 C) 97.9 F (36.6 C)  TempSrc: Oral  Oral Oral  SpO2:  96%  97%  Weight: 124.9 kg     Height:         Intake/Output Summary (Last 24 hours) at 05/17/2020 0847 Last data filed at 05/17/2020 6568 Gross per 24 hour  Intake 240 ml  Output 2775 ml  Net -2535 ml   Filed Weights   05/15/20 0108 05/16/20 0900 05/17/20 0100  Weight: 124.2 kg 125 kg 124.9 kg    Examination:  General exam: Sitting up in bed, more interactive today, on nasal cannula respiratory system:cta, no w/r/r  Cardiovascular system: S1 & S2 heard, RRR. No JVD, murmurs, rubs, gallops or clicks.  Gastrointestinal system: Abdomen is nondistended, soft and nontender. Normal bowel sounds heard.  Central nervous system:  Awake and alert x3 oriented, grossly intact  extremities: trace edema b/l.  No cyanosis Skin: Warm dry Psychiatry: mood acceptable for current setting.    Data Reviewed: I have personally reviewed following labs and imaging studies  CBC: Recent Labs  Lab 05/10/20 2010 05/12/20 0440  WBC 13.1* 9.9  NEUTROABS 10.3*  --   HGB 16.3* 15.2*  HCT 52.5* 51.0*  MCV 87.5 90.3  PLT 344 127   Basic Metabolic Panel: Recent Labs  Lab 05/12/20 0440 05/12/20 0440 05/13/20 0735 05/14/20 0451 05/15/20 0715 05/16/20 0739 05/17/20 0611  NA 137   < > 140 144 145 146* 145  K 4.5   < > 5.2* 5.3* 5.6* 5.0 4.4  CL 95*   < > 95* 99 101 98 96*  CO2 32   < > 35* 36* 37* 41* 42*  GLUCOSE 173*   < > 176* 161* 147* 156* 148*  BUN 95*   < > 97* 81* 64* 50* 43*  CREATININE 2.00*   < > 1.78* 1.41* 1.18* 1.13* 1.20*  CALCIUM 9.1   < > 9.6 9.9 9.7 9.6 9.1  MG 2.1  --  2.6* 2.8* 2.7* 2.3  --   PHOS  --   --   --  2.9  --   --   --    < > = values in this interval not displayed.   GFR: Estimated Creatinine Clearance: 54.8 mL/min (A) (by C-G formula based on SCr of 1.2 mg/dL (H)). Liver Function Tests: Recent Labs  Lab 05/10/20 2010  AST 22  ALT 36  ALKPHOS 49  BILITOT 1.7*  PROT 6.5  ALBUMIN 3.7   No results for input(s): LIPASE, AMYLASE in the last 168 hours. No results for input(s): AMMONIA in the  last 168 hours. Coagulation Profile: No results for input(s): INR, PROTIME in the last 168 hours. Cardiac Enzymes: No results for input(s): CKTOTAL, CKMB, CKMBINDEX, TROPONINI in the last 168 hours. BNP (last 3 results) No results for input(s): PROBNP in the last 8760 hours. HbA1C: No results for input(s): HGBA1C in the last 72 hours. CBG: Recent Labs  Lab 05/16/20 0631 05/16/20 1113 05/16/20 1607 05/16/20 2111 05/17/20 0616  GLUCAP 152* 179* 155* 182* 139*   Lipid Profile: No results for input(s): CHOL, HDL, LDLCALC, TRIG, CHOLHDL, LDLDIRECT in the last 72 hours. Thyroid Function Tests: No results for input(s): TSH, T4TOTAL, FREET4, T3FREE, THYROIDAB in the last 72 hours. Anemia Panel: No results for input(s): VITAMINB12, FOLATE, FERRITIN, TIBC, IRON, RETICCTPCT in the last 72 hours. Sepsis Labs: No results for input(s): PROCALCITON, LATICACIDVEN in the last 168 hours.  Recent Results (from the past 240 hour(s))  SARS Coronavirus 2 by RT PCR (hospital order, performed in [facility] hospital lab) Nasopharyngeal Nasopharyngeal Swab     Status: None   Collection Time: 05/10/20  8:31 PM   Specimen: Nasopharyngeal Swab  Result Value Ref Range Status   SARS Coronavirus 2 NEGATIVE NEGATIVE Final    Comment: (NOTE) SARS-CoV-2 target nucleic acids are NOT DETECTED. The SARS-CoV-2 RNA is generally detectable in upper and lower respiratory specimens during the acute phase of infection. The lowest concentration of SARS-CoV-2 viral copies this assay can detect is 250 copies / mL. A negative result does not preclude SARS-CoV-2 infection and should not be used as the sole basis for treatment or other patient management decisions.  A negative result may occur with improper specimen collection / handling, submission of specimen other than nasopharyngeal swab, presence of viral mutation(s) within the areas targeted by this assay, and inadequate number of viral copies (<250 copies / mL).  A negative result must be combined with clinical observations, patient history, and epidemiological information. Fact Sheet for Patients:   StrictlyIdeas.no Fact Sheet for Healthcare Providers: BankingDealers.co.za This test is not yet approved or cleared  by the [place] FDA and has been authorized for detection and/or diagnosis of SARS-CoV-2 by FDA under an Emergency Use Authorization (EUA).  This EUA will remain in effect (meaning this test can be used) for the duration of the COVID-19 declaration under Section 564(b)(1) of the Act, 21 U.S.C. section 360bbb-3(b)(1), unless the authorization is terminated or revoked sooner. Performed at [facility],  [address], [place], [place] [address]   Culture, Urine     Status: Abnormal   Collection Time: 05/11/20  1:58 AM   Specimen: Urine, Catheterized  Result Value Ref Range Status   Specimen Description URINE, CATHETERIZED  Final   Special Requests ADDED  Final   Culture (A)  Final    >=100,000 COLONIES/mL GROUP B STREP(S.AGALACTIAE)ISOLATED TESTING AGAINST S. AGALACTIAE NOT ROUTINELY PERFORMED DUE TO PREDICTABILITY OF AMP/PEN/VAN SUSCEPTIBILITY. Performed at [facility], [place] [address], [place], [place] [address]    Report Status 05/13/2020 FINAL  Final  Culture, blood (Routine X 2) w Reflex to ID Panel     Status: None   Collection Time: 05/11/20  3:20 PM   Specimen: BLOOD  Result Value Ref Range Status   Specimen Description BLOOD RIGHT ANTECUBITAL  Final   Special Requests   Final    BOTTLES DRAWN AEROBIC AND ANAEROBIC Blood Culture adequate volume   Culture   Final    NO GROWTH 5 DAYS Performed at [facility], [place] [address] [place], [place] [address]    Report Status 05/16/2020 FINAL  Final  Culture, blood (Routine X 2) w Reflex to ID Panel     Status: None   Collection Time: 05/11/20  3:20 PM   Specimen: BLOOD RIGHT HAND  Result Value Ref Range  Status   Specimen Description BLOOD RIGHT HAND  Final   Special Requests   Final    BOTTLES DRAWN AEROBIC ONLY Blood Culture adequate volume   Culture   Final    NO GROWTH 5 DAYS Performed at [facility], [place] [address], [place], [place] [address]    Report Status 05/16/2020 FINAL  Final         Radiology Studies: No results found.      Scheduled Meds: . acetaZOLAMIDE  250 mg Oral Daily  . apixaban  5 mg Oral BID  . chlorhexidine  15 mL Mouth Rinse BID  . Chlorhexidine Gluconate Cloth  6 each Topical Daily  . diltiazem  360 mg Oral Daily  . feeding supplement (NEPRO CARB STEADY)  237 mL Oral TID WC  . furosemide  40 mg Intravenous BID  . insulin aspart  0-9 Units Subcutaneous TID WC  . mouth rinse  15 mL Mouth Rinse q12n4p  . metoprolol tartrate  12.5 mg Oral Q6H  . mometasone-formoterol  2 puff Inhalation BID  . multivitamin with minerals  1 tablet Oral Daily  . sodium chloride flush  3 mL Intravenous Q12H   Continuous Infusions: . sodium chloride    . cefTRIAXone (ROCEPHIN)  IV 1 g (05/16/20 1500)    Assessment & Plan:   Principal Problem:   Acute CHF (congestive heart failure) (HCC) Active Problems:   Diabetes (HCC)   Hypotension   AKI (acute kidney injury) ([place])   Permanent atrial fibrillation ([place])   Cirrhosis ([place])   Acute hypoxemic and hypercapnic respiratory failure -multifactoria. CHF and possible asthma exacerbation Last Echo is 45-50% in 08/2019.  Will repeat. Strict I's and O's.  Daily weights PRN Xopenex Pulmonary consulted, input was appreciated-held Lasix.  Started on IV steroids...>now discontinued as it may cause delirium -wean O2 for sats 90-95% -brovana + pulmicort BID -PRN xopenex  IV antibiotics empirically Rt pleural effusion may need ?thoracentesis . ABG elevated PCO2 , on Bipap (changes made by pccm) continue BiPAP QHS and PRN daytim-e sleep  EchO:EF 40-45%, rv ef mildly reduced, rv volume overload, rv  enlarged, elevated  PASP LE venous US: negative dvt VQ low probability of PE Plan: Continue lasix, likely can change to po in am. Reduce Diamox in couple of days -for contraction alkalosis Need  Sleep study as outpt. Refused rehab, will go home Trilogy is being set up for the patient, needs to use prior to dc. Palliative care consulted-input appreciated- pt will need outpt palliative care.Currently may not be elligible for hospice. Ordered TOC for outpt palliative care   Hypotension Resolved.  AKI Likely from vascular congestion. renal US: Small amount of left-sided perinephric fluid. Today cr 1.20 Continue monitoring on lasix   Hyperkalemia Given Kayexalate previously On low K diet. resolved Continue to monitor   Permanent atrial fibrillation Nonsustained RVR , worsened by respiratory issues HR better with beta blk, will continue , increase as tolerated. Continue with Cardizem Continue Eliquis   Non-insulin-dependent type 2 diabetes Low-dose sliding scale  Goals of care: will consult palliative care   DVT prophylaxis: Eliquis Code Status: DNR Family Communication: called husband but did not pickup. Left vm I called Disposition Plan: home  Inpatient status Remains inpatient appropriate because:Inpatient level of care appropriate due to severity of illness  Dispo: The patient is from: Home  Anticipated d/c is to: Home  Anticipated d/c date is: 2 days  Barrier:Patient currently is not medically stable to d/c.On bipap. Needs iv lasix, trilogy being setup.     LOS: 7 days   Time spent: 45 min with more than >50% on coc    Nolberto Hanlon, MD [facility] Pager [phone]  If 7PM-7AM, please contact night-coverage [email or web address] Password [facility] 05/17/2020, 8:47 AM Patient ID: Amy Riddle, female   DOB: 1946-04-07, 74 y.o.   MRN: [id number]

## 2020-05-17 NOTE — Progress Notes (Signed)
Nutrition Follow-up  DOCUMENTATION CODES:   Morbid obesity  INTERVENTION:   -Continue Nepro Shake po TID, each supplement provides 425 kcal and 19 grams protein -Continue MVI with minerals daily -Continue Magic cup TID with meals, each supplement provides 290 kcal and 9 grams of protein -30 ml Prostat BID, each supplement provides 100 kcals and 15 grams protein  NUTRITION DIAGNOSIS:   Inadequate oral intake related to lethargy/confusion as evidenced by meal completion < 25%.  Ongoing  GOAL:   Patient will meet greater than or equal to 90% of their needs  Progressing   MONITOR:   Supplement acceptance, PO intake, Labs, Weight trends, Skin, I & O's  REASON FOR ASSESSMENT:   Rounds    ASSESSMENT:   Amy Riddle is a 74 y.o. female with medical history significant for Hx of intermittent asthma, HTN, permanent atrial fibrillation on Eliquis, Type 2 DM, and morbid obesity who presents with worsening shortness of breath.  Reviewed I/O's: -2.5 L x 24 hours and -7.9 L since admission  UOP: 2.8 L x 24 hours  Pt resting quietly; sitting up on the side of the bed at time of visit. Per RN notes, pt has been agitated and refusing bi-pap last night.   Pt more alert today than previous visit. Noted meal completion 0-25%. Pt is consuming Nepro supplements. Hopeful intake will increase now that pt is more alert.   Medications reviewed and include cardizem and lasix.   Reviewed wt hx; pt has experienced a 3% wt loss over the past week, likely related to diuresis (-7.9 L since admission).   Labs reviewed: CBGS: 155-182 (inpatient orders for glycemic control are 0-9 units insulin aspart TID with meals).   Diet Order:   Diet Order            Diet renal with fluid restriction Fluid restriction: 1500 mL Fluid; Room service appropriate? Yes; Fluid consistency: Thin  Diet effective now                 EDUCATION NEEDS:   No education needs have been identified at this  time  Skin:  Skin Assessment: Reviewed RN Assessment  Last BM:  05/17/20  Height:   Ht Readings from Last 1 Encounters:  05/11/20 5' 2.5" (1.588 m)    Weight:   Wt Readings from Last 1 Encounters:  05/17/20 124.9 kg    Ideal Body Weight:  51.1 kg  BMI:  Body mass index is 49.56 kg/m.  Estimated Nutritional Needs:   Kcal:  1800-200  Protein:  110-125 grams  Fluid:  > 1.8 L    Loistine Chance, RD, LDN, [place] Registered Dietitian II Certified Diabetes Care and Education Specialist Please refer to [facility] for RD and/or RD on-call/weekend/after hours pager

## 2020-05-17 NOTE — Consult Note (Addendum)
Consultation Note Date: 05/17/2020   Patient Name: Amy Riddle  DOB: 06-Jan-1946  MRN: [id number]  Age / Sex: 74 y.o., female  PCP: Leonard Downing, MD Referring Physician: Nolberto Hanlon, MD  Reason for Consultation: Establishing goals of care  HPI/Patient Profile: 74 y.o. female  with past medical history of Atrial fibrillation on Eliquis, DM type 2, HTN, and intermittent asthma admitted on 05/10/2020 with acute on chronic hypoxic and hypercapnic respiratory failure secondary to CHF and asthma exacerbation. She initially presented to the ED with worsening dyspnea on exertion x 2 weeks, wheezing, and orthopnea. ED workup revealed hypoxemia, fluid overload, and progressive right pleural effusion. PCCM was consulted for hypercapnea; has recommended mandatory nightly BiPAP. Patient also with AKI, likely from vascular congestion, now improved, creatinine noted to be 1.20 as of 6/15.  Palliative medicine has been consulted to assist with goals of care.   Clinical Assessment and Goals of Care: I have reviewed medical records including EPIC notes, labs and imaging, examined the patient and met at bedside with patient and husband  to discuss diagnosis, prognosis, GOC, disposition, and options. Patient is sitting up on the side of the bed, alert, oriented to person, place, and time. Patient has episodes of intermittent confusion during our encounter.   I introduced Palliative Medicine as specialized medical care for people living with serious illness. It focuses on providing relief from the symptoms and stress of a serious illness.   We discussed a brief life review of the patient. She has lived in [place] her entire life. She worked for years as a Scientist, clinical (histocompatibility and immunogenetics) at [employer]. She has been married to [place] for 59 years. They have 1 daughter, Amy Riddle, who lives in [place]. She enjoys reading and crocheting as hobbies. She  reports being a spiritual person, and practices the [employer].   As far as functional and nutritional status, she lives at home with Amy Riddle. It appears she was doing relatively well, until the past 3-4 months when she started having "problems breathing". Amy Riddle states the current disposition plan is for rehab, as he is unable to care for her at home in her current state. Patient is upset by this, stating multiple times that she wants to go home.   We discussed her current illness and what it means in the larger context of her ongoing co-morbidities.  Natural disease trajectory of heart failure was discussed. Patient and husband seem to understand this. Patient does make reference to her mother, who lived with heart failure for years. I provided education about good management of the condition at home (fluid restrictions, taking medications as prescribed, etc) can help improve quality of life. Discussed need for her to use BiPAP,  as well as nursing notes stating she removed BiPAP mask several times last night. Patient verbalizes that she dislikes the mask but will wear it if necessary.  Advanced directives, concepts specific to code status, artifical feeding and hydration, and rehospitalization were considered and discussed. MOST form completed and placed on the chart. Patient is clearly  able to verbalize her wishes regarding advanced directives; however due to intermittent confusion I had both patient and husband to sign the MOST form.     Hospice and Palliative Care services outpatient were explained and offered. Patient and husband are both agreeable to outpatient palliative care. I do not think she would currently be eligible for hospice.   Questions and concerns were addressed.  The patient and husband were encouraged to call with questions or concerns.   Primary decision maker: Patient, with support from husband Amy Riddle. He is her HCPOA if needed, I have asked him to bring a copy of this  document.    SUMMARY OF RECOMMENDATIONS   - DNR/DNI - ativan 0.25 mg twice daily prn for sleep or anxiety (recommend giving at bedtime to increase BiPAP compliance) - TOC order for outpatient palliative care referral   Code Status/Advance Care Planning:  DNR  Scope of treatment (per MOST form completed 05/17/2020):   Cardiopulmonary Resuscitation: Do Not Attempt Resuscitation (DNR/No CPR)  Medical Interventions: Limited Additional Interventions: Use medical treatment, IV fluids and cardiac monitoring as indicated. DO NOT USE intubation or mechanical ventilation. May consider use of less invasive airway support such as BiPAP or CPAP. Also provide comfort measures. Transfer to the hospital if indicated. Avoid intensive care.   Antibiotics: - Antibiotic if indicated  IV Fluids: - No IV fluids  Feeding Tube: - No feeding tube      Palliative Prophylaxis:   Delirium Protocol, Frequent Pain Assessment and Turn Reposition   Psycho-social/Spiritual:   Desire for further Chaplaincy support:no  Prognosis:   Unable to determine  Discharge Planning: [place] for rehab with Palliative care service follow-up      Primary Diagnoses: Present on Admission: **None**   I have reviewed the medical record, interviewed the patient and family, and examined the patient. The following aspects are pertinent.  Past Medical History:  Diagnosis Date  . Arthritis   . Asthma   . Diabetes mellitus without complication (HCC)    TYPE II no meds  . Eczema   . Fibroid   . Hypertension   . Osteoarthritis   . Ovarian neoplasm    PRODUCING TESTOSTERONE  . Submucous myoma of uterus    Social History   Socioeconomic History  . Marital status: Married    Spouse name: Not on file  . Number of children: Not on file  . Years of education: Not on file  . Highest education level: Not on file  Occupational History  . Not on file  Tobacco Use  . Smoking status: Former Smoker     Quit date: 03/26/1974    Years since quitting: 46.1  . Smokeless tobacco: Never Used  Substance and Sexual Activity  . Alcohol use: No  . Drug use: No  . Sexual activity: Not Currently  Other Topics Concern  . Not on file  Social History Narrative   Lives with husband.  One daughter adopted.      Family History  Problem Relation Age of Onset  . Heart disease Mother   . Cancer Father        PANCREATIC  . Hypertension Brother   . Breast cancer Maternal Aunt   . Hypertension Maternal Grandmother   . Heart disease Maternal Grandfather    Scheduled Meds: . acetaZOLAMIDE  250 mg Oral Daily  . apixaban  5 mg Oral BID  . chlorhexidine  15 mL Mouth Rinse BID  . Chlorhexidine Gluconate Cloth  6 each  Topical Daily  . diltiazem  360 mg Oral Daily  . feeding supplement (NEPRO CARB STEADY)  237 mL Oral TID WC  . feeding supplement (PRO-STAT SUGAR FREE 64)  30 mL Oral BID WC  . furosemide  40 mg Intravenous BID  . insulin aspart  0-9 Units Subcutaneous TID WC  . mouth rinse  15 mL Mouth Rinse q12n4p  . metoprolol tartrate  12.5 mg Oral Q6H  . mometasone-formoterol  2 puff Inhalation BID  . multivitamin with minerals  1 tablet Oral Daily  . sodium chloride flush  3 mL Intravenous Q12H   Continuous Infusions: . sodium chloride    . cefTRIAXone (ROCEPHIN)  IV 1 g (05/16/20 1500)   PRN Meds:.sodium chloride, acetaminophen, haloperidol lactate, levalbuterol, ondansetron (ZOFRAN) IV, sodium chloride flush  Medications Prior to Admission:  Prior to Admission medications   Medication Sig Start Date End Date Taking? Authorizing Provider  acetaminophen (TYLENOL) 500 MG tablet Take 1,000 mg by mouth every 8 (eight) hours as needed for mild pain or moderate pain.   Yes [provider]  ADVAIR DISKUS 500-50 MCG/DOSE AEPB Inhale 1 puff into the lungs 2 (two) times daily. 05/05/20  Yes [provider]  albuterol (VENTOLIN HFA) 108 (90 Base) MCG/ACT inhaler Inhale 1-2 puffs into the  lungs 2 (two) times daily as needed for shortness of breath.  07/24/19  Yes [provider]  Clobetasol Prop Emollient Base 0.05 % emollient cream Apply 1 application topically daily as needed (eczema).    Yes [provider]  diclofenac sodium (VOLTAREN) 1 % GEL Apply 2 g topically 2 (two) times daily.   Yes [provider]  diltiazem (CARDIZEM CD) 360 MG 24 hr capsule Take 1 capsule (360 mg total) by mouth daily. 01/27/20  Yes Lendon Colonel, NP  ELIQUIS 5 MG TABS tablet Take 1 tablet (5 mg total) by mouth 2 (two) times daily. 03/09/20  Yes Minus Breeding, MD  furosemide (LASIX) 40 MG tablet Take 40 mg by mouth 2 (two) times daily.  07/21/19  Yes [provider]  loratadine (CLARITIN) 10 MG tablet Take 10 mg by mouth daily as needed for allergies. 05/04/20  Yes [provider]  losartan (COZAAR) 50 MG tablet Take 1 tablet (50 mg total) by mouth at bedtime. 12/28/19  Yes Lendon Colonel, NP  metFORMIN (GLUCOPHAGE) 1000 MG tablet Take 500-1,000 mg by mouth See admin instructions. Take 1000 mg in the morning and 500 mg in the evening 07/28/19  Yes [provider]  nystatin cream (MYCOSTATIN) Apply 1 application topically daily as needed (yeast under breast).  05/10/19  Yes [provider]   Allergies  Allergen Reactions  . Tylenol Arthritis Ext [Acetaminophen] Other (See Comments)    Does not help   Review of Systems  Respiratory: Positive for shortness of breath.   Neurological: Positive for weakness.    Physical Exam Vitals reviewed.  Constitutional:      General: She is not in acute distress.    Appearance: She is obese. She is ill-appearing.  HENT:     Head: Normocephalic and atraumatic.  Cardiovascular:     Rate and Rhythm: Rhythm irregular.  Pulmonary:     Effort: Pulmonary effort is normal.  Neurological:     Mental Status: She is alert and oriented to person, place, and time.     Comments: Intermittent confusion      Vital Signs: BP 116/71 (BP Location: Left Arm)   Pulse 89  Temp 98 F (36.7 C) (Oral)   Resp (!) 22   Ht 5' 2.5" (1.588 m)   Wt 124.9 kg   SpO2 96%   BMI 49.56 kg/m  Pain Scale: 0-10   Pain Score: 0-No pain   SpO2: SpO2: 96 % O2 Device:SpO2: 96 % O2 Flow Rate: .O2 Flow Rate (L/min): 5 L/min  IO: Intake/output summary:   Intake/Output Summary (Last 24 hours) at 05/17/2020 1342 Last data filed at 05/17/2020 8241 Gross per 24 hour  Intake 480 ml  Output 3275 ml  Net -2795 ml    LBM: Last BM Date: 05/17/20 Baseline Weight: Weight: 128.8 kg Most recent weight: Weight: 124.9 kg      Palliative Assessment/Data: 40-50%     Time In: 10:10 Time Out: 11:20 Time Total: 70 minutes Greater than 50%  of this time was spent counseling and coordinating care related to the above assessment and plan.  Signed by: Lavena Bullion, NP   Please contact Palliative Medicine Team phone at [phone] for questions and concerns.  For individual provider: See Shea Evans

## 2020-05-17 NOTE — Progress Notes (Signed)
NAME:  Amy Riddle, MRN:  [id number], DOB:  November 25, 1946, LOS: 7 ADMISSION DATE:  05/10/2020, CONSULTATION DATE: 05/11/20  REFERRING MD:  Dr. Kurtis Bushman / [facility] , CHIEF COMPLAINT:  SOB  Brief History   74 yo female presented with worsening dyspnea for 2 weeks with productive cough, wheezing, leg edema with concern for asthma and CHF exacerbation.  Past Medical History  A-Fib - on eliquis, cardizem.  Has required cardioversion   HTN - on losartan, lasix.  Prior LVEF 40-45% Asthma - on advair  DM II - on metformin  Morbid Obesity  Former Smoker - quit [facility]   6/08 Admit  6/09 PCCM consulted   Consults:    Procedures:    Significant Diagnostic Tests:   Limited ECHO 6/9 >> LVEF ~ 40-45%, LV mildly decreased function, LV demonstrates global hypokinesis, interventricular septum flattened in systole consistent with RV pressure overload, RV systolic function moderately reduced, RV is severely enlarged, LA mildly dilated, RA severely dilated  Renal US 6/9 >> no mass or hydronephrosis, small amount left-sided perinephric fluid  V/Q scan 6/11 >> low probability for PE  Micro Data:  COVID 6/8 >> negative   Antimicrobials:  Rocephin 6/9 >>  Azithro 6/9 >> 6/11  Interim history/subjective:  Refused to wear BiPAP during the night.  She has a negative I&O.  Objective   Blood pressure 116/71, pulse 89, temperature 98.4 F (36.9 C), temperature source Oral, resp. rate (!) 22, height 5' 2.5" (1.588 m), weight 124.9 kg, SpO2 96 %.        Intake/Output Summary (Last 24 hours) at 05/17/2020 1203 Last data filed at 05/17/2020 0928 Gross per 24 hour  Intake 480 ml  Output 3275 ml  Net -2795 ml   Filed Weights   05/15/20 0108 05/16/20 0900 05/17/20 0100  Weight: 124.2 kg 125 kg 124.9 kg    Examination:  Neurologically.  Awake alert follows commands refuses to wear noninvasive mechanical ventilatory support HEENT no neck Chest decreased air movement  throughout Cardiac heart sounds are distant Abdomen is obese multiple folds of the pannus positive bowel sounds Extremity with 2-3+ edema  Resolved Hospital Problem list      Assessment & Plan:   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 Continue Dulera Unable to tolerate noninvasive mechanical ventilatory support she absolutely refuses to use it. Supplemental oxygen as needed Continue diuresis as tolerated currently on Diamox for contraction alkalosis.  Consider reducing Diamox in near future Pulmonary sleep study as an outpatient although she refused to use noninvasive mechanical ventilatory support     Permanent A fib, acute on chronic combined CHF, HTN.  Intake/Output Summary (Last 24 hours) at 05/17/2020 1205 Last data filed at 05/17/2020 0928 Gross per 24 hour  Intake 480 ml  Output 3275 ml  Net -2795 ml   Diuresis as tolerated Diamox as a temporary measure Eliquis   Urinary tract infection   Best practice:  Diet: heart healthy DVT prophylaxis: eliquis Mobility: as tolerated  Code Status: DNR  Disposition: Progressive   Labs    CMP Latest Ref Rng & Units 05/17/2020 05/16/2020 05/15/2020  Glucose 70 - 99 mg/dL 148(H) 156(H) 147(H)  BUN 8 - 23 mg/dL 43(H) 50(H) 64(H)  Creatinine 0.44 - 1.00 mg/dL 1.20(H) 1.13(H) 1.18(H)  Sodium 135 - 145 mmol/L 145 146(H) 145  Potassium 3.5 - 5.1 mmol/L 4.4 5.0 5.6(H)  Chloride 98 - 111 mmol/L 96(L)  98 101  CO2 22 - 32 mmol/L 42(H) 41(H) 37(H)  Calcium 8.9 - 10.3 mg/dL 9.1 9.6 9.7  Total Protein 6.5 - 8.1 g/dL - - -  Total Bilirubin 0.3 - 1.2 mg/dL - - -  Alkaline Phos 38 - 126 U/L - - -  AST 15 - 41 U/L - - -  ALT 0 - 44 U/L - - -    CBC Latest Ref Rng & Units 05/12/2020 05/10/2020 10/01/2019  WBC 4.0 - 10.5 K/uL 9.9 13.1(H) 7.8  Hemoglobin 12.0 - 15.0 g/dL 15.2(H) 16.3(H) 15.8  Hematocrit 36 - 46 % 51.0(H) 52.5(H) 49.3(H)    Platelets 150 - 400 K/uL 249 344 235    ABG    Component Value Date/Time   PHART 7.337 (L) 05/16/2020 2115   PCO2ART 81.8 (HH) 05/16/2020 2115   PO2ART 83.1 05/16/2020 2115   HCO3 42.5 (H) 05/16/2020 2115   O2SAT 96.1 05/16/2020 2115    CBG (last 3)  Recent Labs    05/16/20 1607 05/16/20 2111 05/17/20 0616  GLUCAP 155* 182* 139*     Signature:   Richardson Landry Aliza Moret ACNP Acute Care Nurse Practitioner [place] Please consult Amion 05/17/2020, 12:03 PM\

## 2020-05-17 NOTE — Progress Notes (Signed)
Pt agitated and noncompliant, refusing BIPAP. MD paged. Pt has removed BIPAP mask 4x tonight. Encouraged and educated pt on importance, however pt stated "it's my choice". Monitoring O2, currently HFNC 5L, 96% sat.

## 2020-05-17 NOTE — TOC Progression Note (Addendum)
Transition of [facility]) - Progression Note    Patient Details  Name: Amy Riddle MRN: [id number] Date of Birth: 1946-01-30  [facility] CM/SW Contact  Carles Collet, RN Phone Number: 05/17/2020, 2:40 PM  Clinical Narrative:    Spoke to patient at bedside. She states that she will be going home at [place]. She states that her spouse is arranging a home health aid to cover from 2-9 when he is at work.  She states that she is agreeable for me to order her Trilogy NIV. Referral placed to [place]. Order will be placed on chart that need MD signature. Messaged Dr Lamonte Sakai for order.  [place] services arranged through [place].  Trilogy order signed by attending, faxed to [place].  Patient will also likely need order for home oxygen  Expected Discharge Plan: [place] Barriers to Discharge: Continued Medical Work up  Expected Discharge Plan and Services Expected Discharge Plan: [place] In-house Referral: Clinical Social Work Discharge Planning Services: CM Consult Post Acute Care Choice: [place] arrangements for the past 2 months: Single Family Home                 DME Arranged: NIV DME Agency: [place] Date DME Agency Contacted: 05/17/20 Time DME Agency Contacted: 1440 Representative spoke with at DME Agency: Magda Paganini HH Arranged: RN, PT, OT, Nurse's Aide, Social Work [employer] Agency: [place] Date [place]: 05/17/20 Time [place]: 1440 Representative spoke with at [place]: [place] ([place]) Interventions    Readmission Risk Interventions No flowsheet data found.

## 2020-05-17 NOTE — Care Management (Cosign Needed)
Patient continues to exhibit signs of hypercapnia associated with chronic respiratory failure secondary to severe chronic hypercapnic respiratory failure. Patient requires the use of NIV both at sleep and in the daytime to help with exacerbation periods. The use of the NIV will treat both the patients high PCO2 levels and can reduce the risk of exacerbations and future hospitalizations when used at night and during the day. The patient will need these advanced settings in conjunction with their current medication regimen; BIPAP is not an option due to its functional limitations and the severity of the patient's condition. Failure to have NIV available for use over a 24 hour period could lead to death.  Patient is able to clear airway and manage secretions.

## 2020-05-17 NOTE — Progress Notes (Signed)
Physical Therapy Treatment Patient Details Name: Amy Riddle MRN: [id number] DOB: 1946/11/11 Today's Date: 05/17/2020    History of Present Illness 74 yo female presented with worsening dyspnea for 2 weeks with productive cough, wheezing, leg edema with concern for asthma and CHF exacerbation.    PT Comments    Pt up in chair and motivated to participate in PT upon arrival to room. Pt appears more cognitively intact vs on eval, but still lacks insight into deficits and how deficits carryover into function. Pt ambulated short room distance x2 with use of RW and close guard for safety, pt limited by dyspnea on exertion as well as drop in SpO2 to 87% on 4LO2. Pt recovered well with cues for breathing technique, seated rest, and not talking. PT continues to feel SNF is most appropriate option for pt upon d/c, pt is adamant she wants to d/c home. If pt d/cs home, will need maximal HH services.    Follow Up Recommendations  SNF;Supervision/Assistance - 24 hour     Equipment Recommendations  None recommended by PT    Recommendations for Other Services       Precautions / Restrictions Precautions Precautions: Fall Restrictions Weight Bearing Restrictions: No    Mobility  Bed Mobility Overal bed mobility: Needs Assistance Bed Mobility: Sit to Supine       Sit to supine: Min assist;HOB elevated   General bed mobility comments: Min assist for LE lifting into bed, scooting pt over in bed with use of bed pad.  Transfers Overall transfer level: Needs assistance Equipment used: Rolling walker (2 wheeled) Transfers: Sit to/from Stand Sit to Stand: Min guard;From elevated surface         General transfer comment: for safety, increased time to rise and steady. VC for hand placement when rising not followed. Sit to stand x4, from recliner, desk chair in room, and from EOB x2.  Ambulation/Gait Ambulation/Gait assistance: Min guard Gait Distance (Feet): 12 Feet  (12+5) Assistive device: Rolling walker (2 wheeled) Gait Pattern/deviations: Step-through pattern;Decreased stride length;Trunk flexed Gait velocity: decr   General Gait Details: Min guard for safety, verbal cuing for placement in RW, upright posture, breathing technique during mobility. SpO2 min  87% on 4LO2, recovered with breathing technique and seated rests x2.   Stairs             Wheelchair Mobility    Modified Rankin (Stroke Patients Only)       Balance Overall balance assessment: Needs assistance Sitting-balance support: No upper extremity supported;Feet supported Sitting balance-Leahy Scale: Fair     Standing balance support: Bilateral upper extremity supported;During functional activity Standing balance-Leahy Scale: Poor Standing balance comment: relies on UE support for balance.                             Cognition Arousal/Alertness: Awake/alert Behavior During Therapy: WFL for tasks assessed/performed Overall Cognitive Status: Impaired/Different from baseline Area of Impairment: Safety/judgement;Problem solving                         Safety/Judgement: Decreased awareness of deficits;Decreased awareness of safety   Problem Solving: Requires verbal cues;Requires tactile cues General Comments: Confusion appears better, A&O but still lacks insight into deficits and how mobility issues with make d/c home difficult.      Exercises Other Exercises Other Exercises: breathing technique to recover SpO2    General Comments  Pertinent Vitals/Pain Pain Assessment: No/denies pain    Home Living                      Prior Function            PT Goals (current goals can now be found in the care plan section) Acute Rehab PT Goals Patient Stated Goal: to go home PT Goal Formulation: With patient Time For Goal Achievement: 05/30/20 Potential to Achieve Goals: Good Progress towards PT goals: Progressing toward  goals    Frequency    Min 3X/week      PT Plan Current plan remains appropriate    Co-evaluation              AM-PAC PT "6 Clicks" Mobility   Outcome Measure  Help needed turning from your back to your side while in a flat bed without using bedrails?: A Little Help needed moving from lying on your back to sitting on the side of a flat bed without using bedrails?: A Little Help needed moving to and from a bed to a chair (including a wheelchair)?: A Little Help needed standing up from a chair using your arms (e.g., wheelchair or bedside chair)?: A Little Help needed to walk in hospital room?: A Little Help needed climbing 3-5 steps with a railing? : Total 6 Click Score: 16    End of Session Equipment Utilized During Treatment: Gait belt;Oxygen Activity Tolerance: Patient limited by fatigue Patient left: with call bell/phone within reach;in bed;with bed alarm set Nurse Communication: Mobility status PT Visit Diagnosis: Muscle weakness (generalized) (M62.81);Other abnormalities of gait and mobility (R26.89)     Time: 9233-0076 PT Time Calculation (min) (ACUTE ONLY): 28 min  Charges:  $Gait Training: 8-22 mins $Therapeutic Activity: 8-22 mins                     Nehemiah Mcfarren E, PT Acute Rehabilitation Services Pager [phone]  Office [phone]   [place] D Elonda Husky 05/17/2020, 5:08 PM

## 2020-05-17 NOTE — TOC Progression Note (Signed)
Transition of Care [facility]) - Progression Note    Patient Details  Name: Amy Riddle MRN: [id number] Date of Birth: [birth year]  Transition of Care [facility]) CM/SW Contact  Carles Collet, RN Phone Number: 05/17/2020, 10:33 AM  Clinical Narrative:    [name] w patient's spouse over the phone. He confirms that he cannot care for at home, would like SNF.  He states that she does not currently have home oxygen, or any other breathing machine. Requested Lazarus Gowda to review chart to see if patient would qualify for a NIV/ Trilogy.    Expected Discharge Plan: [place] Barriers to Discharge: Continued Medical Work up, Ship broker  Expected Discharge Plan and Services Expected Discharge Plan: [place] In-house Referral: Clinical Social Work     Living arrangements for the past 2 months: Single Family Home                                       Social Determinants of Health (SDOH) Interventions    Readmission Risk Interventions No flowsheet data found.

## 2020-05-18 LAB — BASIC METABOLIC PANEL
Anion gap: 12 (ref 5–15)
BUN: 46 mg/dL — ABNORMAL HIGH (ref 8–23)
CO2: 35 mmol/L — ABNORMAL HIGH (ref 22–32)
Calcium: 9 mg/dL (ref 8.9–10.3)
Chloride: 96 mmol/L — ABNORMAL LOW (ref 98–111)
Creatinine, Ser: 1.17 mg/dL — ABNORMAL HIGH (ref 0.44–1.00)
GFR calc Af Amer: 54 mL/min — ABNORMAL LOW (ref >60)
GFR calc non Af Amer: 47 mL/min — ABNORMAL LOW (ref >60)
Glucose, Bld: 137 mg/dL — ABNORMAL HIGH (ref 70–99)
Potassium: 4.2 mmol/L (ref 3.5–5.1)
Sodium: 143 mmol/L (ref 135–145)

## 2020-05-18 LAB — GLUCOSE, CAPILLARY
Glucose-Capillary: 133 mg/dL — ABNORMAL HIGH (ref 70–99)
Glucose-Capillary: 228 mg/dL — ABNORMAL HIGH (ref 70–99)
Glucose-Capillary: 128 mg/dL — ABNORMAL HIGH (ref 70–99)
Glucose-Capillary: 179 mg/dL — ABNORMAL HIGH (ref 70–99)

## 2020-05-18 LAB — BRAIN NATRIURETIC PEPTIDE: B Natriuretic Peptide: 451.5 pg/mL — ABNORMAL HIGH (ref 0.0–100.0)

## 2020-05-18 NOTE — Progress Notes (Signed)
Patient desatting 82-86% while RT at bedside with flowmeter on 7L.  RT could not feel oxygen coming from the nasal prongs.  RT changed out oxygen tubing.  Patient now on 5L satting 97%.  RT will continue to monitor.

## 2020-05-18 NOTE — Progress Notes (Signed)
PROGRESS NOTE    Amy Riddle  YBO:[id number] DOB: [birth year] DOA: 05/10/2020 PCP: Leonard Downing, MD    Brief Narrative:Amy F Campbellis a 74 y.o.femalewith medical history significant forHx of intermittent asthma, HTN, permanent atrial fibrillation on Eliquis, Type 2 DM, and morbid obesity who presents with worsening shortness of breath.  Patient reports that she has been having ongoing shortness of breath that is worse this week. Worse especially with exertion and better at rest. Had to increase to sleeping on 2 pillows. Has had wheezing, increased cough with sputum production. No fever. Also noted bilateral lower extremity edema. She does not have a formal diagnosis of CHF. She was evaluated by her PCP about 4 days ago and had increases to her Advair and increase of her Lasix from 40 mg to 60 mg. However she did not feel like she had increased urine output from that.  Patient presented presented to ED on CPAP and was found to have O2 saturation in 70s.Not able to wean down to a nonrebreather on 4 L.  Has leukocytosis of 13.1. Creatinine elevated to 2.69 from a prior of 0.70. Elevated bilirubin of 1.7. Anion gap of 18. BNP of 605.4.   CXR shows progressive right pleural effusion with increasing fluid and cardiomegaly with vascular congestion.  She was given Solu-Medrol, DuoNeb and 40 mg Lasix in the ED.  Assessment & Plan:   Principal Problem:   Acute CHF (congestive heart failure) (HCC) Active Problems:   Diabetes (HCC)   Hypotension   AKI (acute kidney injury) ([place])   Permanent atrial fibrillation (HCC)   Cirrhosis (HCC)   Respiratory distress   Pleural effusion   Palliative care by specialist   DNR (do not resuscitate)   Goals of care, counseling/discussion   Acute heart failure ([place])   Chronic respiratory failure with hypoxia and hypercapnia ([place])    #1 acute on chronic hypoxic hypercapnic respiratory failure secondary to CHF and  asthma exacerbation with morbid obesity likely sleep apnea and obesity hypoventilation- She was able to tolerate BiPAP for few hours last night. Continue Lasix, Xopenex Titrate to keep her oxygen above 88% Continue Brovana Pulmicort She finished a course of antibiotics with Rocephin and azithromycin. BiPAP nightly and as needed Echo ejection fraction 40 to 45% 6/9 Finished Diamox Follow-up with pulmonary as an outpatient for sleep study Discussed with patient's husband who has had recent shoulder surgery and is concerned that he will not be able to care for her at home.  He is requesting the patient to go to rehab. Follow-up with palliative care as an outpatient. Trelegy upon discharge. Chest x-ray-6/12-persistent right base retrocardiac opacification likely effusion/atelectasis I's and O's-negative by 9.5 L ABG 05/16/2020 7.33/81/83  #2 AKI monitor closely on Lasix Renal ultrasound  #3 chronic atrial fibrillation on Cardizem and Eliquis continue  #4 type 2 diabetes on ssi.  CBG (last 3)  Recent Labs    05/17/20 1547 05/17/20 2135 05/18/20 0600  GLUCAP 139* 160* 133*     #5 goals of care patient is DNR  Nutrition Problem: Inadequate oral intake Etiology: lethargy/confusion     Signs/Symptoms: meal completion < 25%    Interventions: Ensure Enlive (each supplement provides 350kcal and 20 grams of protein), MVI, Magic cup  Estimated body mass index is 50.08 kg/m as calculated from the following:   Height as of this encounter: 5' 2.5" (1.588 m).   Weight as of this encounter: 126.2 kg.  DVT prophylaxis: Eliquis Code Status: DNR Family Communication: Discussed  with her husband Disposition Plan:  Status is: Inpatient  Dispo: The patient is from: Home              Anticipated d/c is to: SNF              Anticipated d/c date is: 2 to 3 days              Patient currently is not medically stable to d/c.  Patient on IV diuresis for hypoxic hypercapnic respiratory  failure due to CHF and asthma on nebulizers    Consultants:   PCCM and palliative care  Procedures: None Antimicrobials: Finished course of azithromycin and Rocephin  Subjective: She is resting in bed asleep when I walked in When I called her name she opened her eyes but go straight back to sleep Overnight she was able to tolerate BiPAP for few hours  Objective: Vitals:   05/18/20 0425 05/18/20 0426 05/18/20 0500 05/18/20 0756  BP:      Pulse: 96 64    Resp: 18 18    Temp:    98.5 F (36.9 C)  TempSrc:    Oral  SpO2: 92% 94%    Weight:   126.2 kg   Height:        Intake/Output Summary (Last 24 hours) at 05/18/2020 1048 Last data filed at 05/18/2020 0436 Gross per 24 hour  Intake 720 ml  Output 2100 ml  Net -1380 ml   Filed Weights   05/17/20 0100 05/18/20 0012 05/18/20 0500  Weight: 124.9 kg 126.2 kg 126.2 kg    Examination:  General exam: Appears calm and comfortable  Respiratory system diminished breath sounds at the bases to auscultation. Respiratory effort normal. Cardiovascular system: S1 & S2 heard, RRR. No JVD, murmurs, rubs, gallops or clicks. No pedal edema. Gastrointestinal system: Abdomen is nondistended, soft and nontender. No organomegaly or masses felt. Normal bowel sounds heard. Central nervous system: Alert and oriented. No focal neurological deficits. Extremities: 2+ pitting edema Skin: No rashes, lesions or ulcers Psychiatry: Judgement and insight appear normal. Mood & affect appropriate.     Data Reviewed: I have personally reviewed following labs and imaging studies  CBC: Recent Labs  Lab 05/12/20 0440  WBC 9.9  HGB 15.2*  HCT 51.0*  MCV 90.3  PLT 786   Basic Metabolic Panel: Recent Labs  Lab 05/12/20 0440 05/12/20 0440 05/13/20 0735 05/13/20 0735 05/14/20 0451 05/15/20 0715 05/16/20 0739 05/17/20 0611 05/18/20 0443  NA 137   < > 140   < > 144 145 146* 145 143  K 4.5   < > 5.2*   < > 5.3* 5.6* 5.0 4.4 4.2  CL 95*   < >  95*   < > 99 101 98 96* 96*  CO2 32   < > 35*   < > 36* 37* 41* 42* 35*  GLUCOSE 173*   < > 176*   < > 161* 147* 156* 148* 137*  BUN 95*   < > 97*   < > 81* 64* 50* 43* 46*  CREATININE 2.00*   < > 1.78*   < > 1.41* 1.18* 1.13* 1.20* 1.17*  CALCIUM 9.1   < > 9.6   < > 9.9 9.7 9.6 9.1 9.0  MG 2.1  --  2.6*  --  2.8* 2.7* 2.3  --   --   PHOS  --   --   --   --  2.9  --   --   --   --    < > =  values in this interval not displayed.   GFR: Estimated Creatinine Clearance: 56.6 mL/min (A) (by C-G formula based on SCr of 1.17 mg/dL (H)). Liver Function Tests: No results for input(s): AST, ALT, ALKPHOS, BILITOT, PROT, ALBUMIN in the last 168 hours. No results for input(s): LIPASE, AMYLASE in the last 168 hours. No results for input(s): AMMONIA in the last 168 hours. Coagulation Profile: No results for input(s): INR, PROTIME in the last 168 hours. Cardiac Enzymes: No results for input(s): CKTOTAL, CKMB, CKMBINDEX, TROPONINI in the last 168 hours. BNP (last 3 results) No results for input(s): PROBNP in the last 8760 hours. HbA1C: No results for input(s): HGBA1C in the last 72 hours. CBG: Recent Labs  Lab 05/17/20 0616 05/17/20 1205 05/17/20 1547 05/17/20 2135 05/18/20 0600  GLUCAP 139* 145* 139* 160* 133*   Lipid Profile: No results for input(s): CHOL, HDL, LDLCALC, TRIG, CHOLHDL, LDLDIRECT in the last 72 hours. Thyroid Function Tests: No results for input(s): TSH, T4TOTAL, FREET4, T3FREE, THYROIDAB in the last 72 hours. Anemia Panel: No results for input(s): VITAMINB12, FOLATE, FERRITIN, TIBC, IRON, RETICCTPCT in the last 72 hours. Sepsis Labs: No results for input(s): PROCALCITON, LATICACIDVEN in the last 168 hours.  Recent Results (from the past 240 hour(s))  SARS Coronavirus 2 by RT PCR (hospital order, performed in [facility] hospital lab) Nasopharyngeal Nasopharyngeal Swab     Status: None   Collection Time: 05/10/20  8:31 PM   Specimen: Nasopharyngeal Swab  Result Value  Ref Range Status   SARS Coronavirus 2 NEGATIVE NEGATIVE Final    Comment: (NOTE) SARS-CoV-2 target nucleic acids are NOT DETECTED. The SARS-CoV-2 RNA is generally detectable in upper and lower respiratory specimens during the acute phase of infection. The lowest concentration of SARS-CoV-2 viral copies this assay can detect is 250 copies / mL. A negative result does not preclude SARS-CoV-2 infection and should not be used as the sole basis for treatment or other patient management decisions.  A negative result may occur with improper specimen collection / handling, submission of specimen other than nasopharyngeal swab, presence of viral mutation(s) within the areas targeted by this assay, and inadequate number of viral copies (<250 copies / mL). A negative result must be combined with clinical observations, patient history, and epidemiological information. Fact Sheet for Patients:   StrictlyIdeas.no Fact Sheet for Healthcare Providers: BankingDealers.co.za This test is not yet approved or cleared  by the [place] FDA and has been authorized for detection and/or diagnosis of SARS-CoV-2 by FDA under an Emergency Use Authorization (EUA).  This EUA will remain in effect (meaning this test can be used) for the duration of the COVID-19 declaration under Section 564(b)(1) of the Act, 21 U.S.C. section 360bbb-3(b)(1), unless the authorization is terminated or revoked sooner. Performed at [facility], [place] [address], [place], [place] [address]   Culture, Urine     Status: Abnormal   Collection Time: 05/11/20  1:58 AM   Specimen: Urine, Catheterized  Result Value Ref Range Status   Specimen Description URINE, CATHETERIZED  Final   Special Requests ADDED  Final   Culture (A)  Final    >=100,000 COLONIES/mL GROUP B STREP(S.AGALACTIAE)ISOLATED TESTING AGAINST S. AGALACTIAE NOT ROUTINELY PERFORMED DUE TO PREDICTABILITY OF AMP/PEN/VAN  SUSCEPTIBILITY. Performed at [facility], [place] [address] [place],  [address]    Report Status 05/13/2020 FINAL  Final  Culture, blood (Routine X 2) w Reflex to ID Panel     Status: None   Collection Time:  05/11/20  3:20 PM   Specimen: BLOOD  Result Value Ref Range Status   Specimen Description BLOOD RIGHT ANTECUBITAL  Final   Special Requests   Final    BOTTLES DRAWN AEROBIC AND ANAEROBIC Blood Culture adequate volume   Culture   Final    NO GROWTH 5 DAYS Performed at [facility], [address] [place], [place] [address]    Report Status 05/16/2020 FINAL  Final  Culture, blood (Routine X 2) w Reflex to ID Panel     Status: None   Collection Time: 05/11/20  3:20 PM   Specimen: BLOOD RIGHT HAND  Result Value Ref Range Status   Specimen Description BLOOD RIGHT HAND  Final   Special Requests   Final    BOTTLES DRAWN AEROBIC ONLY Blood Culture adequate volume   Culture   Final    NO GROWTH 5 DAYS Performed at [facility], [place] [address] [place], [place] [address]    Report Status 05/16/2020 FINAL  Final         Radiology Studies: No results found.      Scheduled Meds: . apixaban  5 mg Oral BID  . chlorhexidine  15 mL Mouth Rinse BID  . Chlorhexidine Gluconate Cloth  6 each Topical Daily  . diltiazem  360 mg Oral Daily  . feeding supplement (NEPRO CARB STEADY)  237 mL Oral TID WC  . feeding supplement (PRO-STAT SUGAR FREE 64)  30 mL Oral BID WC  . furosemide  40 mg Intravenous BID  . insulin aspart  0-9 Units Subcutaneous TID WC  . mouth rinse  15 mL Mouth Rinse q12n4p  . metoprolol tartrate  12.5 mg Oral Q6H  . mometasone-formoterol  2 puff Inhalation BID  . multivitamin with minerals  1 tablet Oral Daily  . sodium chloride flush  3 mL Intravenous Q12H   Continuous Infusions: . sodium chloride       LOS: 8 days     Georgette Shell, MD 05/18/2020, 10:48 AM

## 2020-05-18 NOTE — Progress Notes (Signed)
Daily Progress Note   Patient Name: Amy Riddle       Date: 05/18/2020 DOB: 1946/11/18  Age: 74 y.o. MRN#: [id number] Attending Physician: Georgette Shell, MD Primary Care Physician: Leonard Downing, MD Admit Date: 05/10/2020  Reason for Consultation/Follow-up: Non pain symptom management, continued GOC discussion  HPI/Patient Profile: 74 y.o. female  with past medical history of Atrial fibrillation on Eliquis, DM type 2, HTN, and intermittent asthma admitted on 05/10/2020 with acute on chronic hypoxic and hypercapnic respiratory failure secondary to CHF and asthma exacerbation. She initially presented to the ED with worsening dyspnea on exertion x 2 weeks, wheezing, and orthopnea. ED workup revealed hypoxemia, fluid overload, and progressive right pleural effusion. PCCM was consulted for hypercapnea; has recommended mandatory nightly BiPAP. Patient also with AKI, likely from vascular congestion, now improved, creatinine noted to be 1.20 as of 6/15.  Palliative medicine has been consulted to assist with goals of care.   Subjective: Patient sitting up on the side of the bed, alert and oriented x 3. Reports she "wore the mask" all night (referring to BiPAP). Per MAR review patient was given ativan at 23:19 last night, she reports this helped greatly and she was able to sleep. Patient states again (as she did yesterday) that she wants to go home and does not want to go to rehab. I provided reassurance that it would only be for a short time.   Length of Stay: 8  Current Medications: Scheduled Meds:  . apixaban  5 mg Oral BID  . chlorhexidine  15 mL Mouth Rinse BID  . Chlorhexidine Gluconate Cloth  6 each Topical Daily  . diltiazem  360 mg Oral Daily  . feeding supplement (NEPRO  CARB STEADY)  237 mL Oral TID WC  . feeding supplement (PRO-STAT SUGAR FREE 64)  30 mL Oral BID WC  . furosemide  40 mg Intravenous BID  . insulin aspart  0-9 Units Subcutaneous TID WC  . mouth rinse  15 mL Mouth Rinse q12n4p  . metoprolol tartrate  12.5 mg Oral Q6H  . mometasone-formoterol  2 puff Inhalation BID  . multivitamin with minerals  1 tablet Oral Daily  . sodium chloride flush  3 mL Intravenous Q12H    Continuous Infusions: . sodium chloride      PRN Meds: sodium  chloride, acetaminophen, haloperidol lactate, levalbuterol, LORazepam, ondansetron (ZOFRAN) IV, sodium chloride flush  Physical Exam Vitals reviewed.  HENT:     Head: Normocephalic and atraumatic.  Cardiovascular:     Rate and Rhythm: Rhythm irregular.     Comments: A-fib on monitor Pulmonary:     Effort: Pulmonary effort is normal.     Comments: O2 at 5L Neurological:     Mental Status: She is alert and oriented to person, place, and time.             Vital Signs: BP (!) 94/44   Pulse 64   Temp 98.5 F (36.9 C) (Oral)   Resp 18   Ht 5' 2.5" (1.588 m)   Wt 126.2 kg   SpO2 94%   BMI 50.08 kg/m  SpO2: SpO2: 94 % O2 Device: O2 Device: High Flow Nasal Cannula O2 Flow Rate: O2 Flow Rate (L/min): 5 L/min  Intake/output summary:   Intake/Output Summary (Last 24 hours) at 05/18/2020 1206 Last data filed at 05/18/2020 0436 Gross per 24 hour  Intake 720 ml  Output 2100 ml  Net -1380 ml   LBM: Last BM Date: 05/17/20 Baseline Weight: Weight: 128.8 kg Most recent weight: Weight: 126.2 kg       Palliative Assessment/Data: 40-50%      Patient Active Problem List   Diagnosis Date Noted  . Respiratory distress   . Pleural effusion   . Palliative care by specialist   . DNR (do not resuscitate)   . Goals of care, counseling/discussion   . Acute heart failure ([place])   . Chronic respiratory failure with hypoxia and hypercapnia (HCC)   . Cirrhosis ([place])   . Acute CHF (congestive heart failure)  ([place]) 05/10/2020  . Hypotension 05/10/2020  . AKI (acute kidney injury) ([place]) 05/10/2020  . Permanent atrial fibrillation ([place]) 05/10/2020  . Atrial fibrillation ([place]) 08/28/2019  . Fibroid uterus 04/22/2013  . Postmenopausal bleeding 04/22/2013  . HTN (hypertension) 03/26/2013  . Diabetes ([place]) 03/26/2013  . Morbid obesity ([place]) 03/26/2013    Palliative Care Assessment & Plan   Assessment: Patient is much less confused compared to yesterday, likely due to compliance with BiPAP last night. Ativan appears to have helped with sleep and agitation.  Recommendations/Plan: - continue ativan 0.25 mg twice daily prn for sleep or anxiety (recommend giving at bedtime to increase BiPAP compliance) - patient will be followed by outpatient palliative care  Code Status: DNR/DNI  Scope of treatment(per MOST form completed 05/17/2020):  Cardiopulmonary Resuscitation: Do Not Attempt Resuscitation (DNR/No CPR)  Medical Interventions: Limited Additional Interventions: Use medical treatment, IV fluids and cardiac monitoring as indicated. DO NOT USE intubation or mechanical ventilation. May consider use of less invasive airway support such as BiPAP or CPAP. Also provide comfort measures. Transfer to the hospital if indicated. Avoid intensive care.  Antibiotics: - Antibiotic if indicated  IV Fluids: - No IV fluids  Feeding Tube: - No feeding tube    Prognosis:   Unable to determine  Discharge Planning:  [place] for rehab with Palliative care service follow-up  Care plan was discussed with case management  Thank you for allowing the Palliative Medicine Team to assist in the care of this patient.   Total Time 35 minutes Prolonged Time Billed  no       Greater than 50%  of this time was spent counseling and coordinating care related to the above assessment and plan.  Lavena Bullion, NP  Please contact Palliative Medicine  Team phone at [phone] for questions and  concerns.

## 2020-05-18 NOTE — Care Management (Signed)
Spoke w patient at bedside again. Discussed options and preferences for discharge plan. Patient continues to refuse SNF placement. She understands that her spouse would prefer SNF placement, however she has the autonomy to decide for herself. I suggested to her that she discuss her preferences with her spouse. [place] services have been arranged. TOC will continue to follow.

## 2020-05-18 NOTE — TOC Progression Note (Signed)
Transition of Care [facility]) - Progression Note    Patient Details  Name: Amy Riddle MRN: [id number] Date of Birth: 19-Oct-1946  [facility] of [facility]) CM/SW Contact  Carles Collet, RN Phone Number: 05/18/2020, 12:09 PM  Clinical Narrative:    Confirmed w patient that she is still requesting home health services, does not want SNF. Patient agreeable to outpatient palliative services. Discussed providers, and referral placed to [facility].     Expected Discharge Plan: [place] Barriers to Discharge: Continued Medical Work up  Expected Discharge Plan and Services Expected Discharge Plan: [place] In-house Referral: Clinical Social Work Discharge Planning Services: CM Consult Post Acute Care Choice: [place] arrangements for the past 2 months: Single Family Home                 DME Arranged: [facility] DME Agency: [place] Date DME Agency Contacted: 05/17/20 Time DME Agency Contacted: 1440 Representative spoke with at DME Agency: Magda Paganini HH Arranged: RN, PT, OT, Nurse's Aide, Social Work [employer] Agency: [place] Date [place]: 05/17/20 Time [place]: 1440 Representative spoke with at [place]: [place] ([place]) Interventions    Readmission Risk Interventions No flowsheet data found.

## 2020-05-18 NOTE — Progress Notes (Signed)
SATURATION QUALIFICATIONS: (This note is used to comply with regulatory documentation for home oxygen)  Patient Saturations on Room Air at Rest = 85%  Patient Saturations on Room Air while Ambulating = NT as desats on RA at rest  Patient Saturations on 6 Liters of oxygen while Ambulating = 88%  Please briefly explain why patient needs home oxygen:Pt needs O2 at rest and with activity.   Annette Liotta W,PT Acute Rehabilitation Services Pager:  [phone]  Office:  [phone]

## 2020-05-18 NOTE — Progress Notes (Addendum)
Physical Therapy Treatment Patient Details Name: Amy Riddle MRN: [id number] DOB: 1946-04-08 Today's Date: 05/18/2020    History of Present Illness 74 yo female presented with worsening dyspnea for 2 weeks with productive cough, wheezing, leg edema with concern for asthma and CHF exacerbation.    PT Comments    Pt admitted with above diagnosis. Pt session focused on up and down stairs.  Pt was able to ascend and descend 5 steps with +2 min assist however with poor safety overall.  Pt needs further practice. Pt also needed 6LO2 with activity to keep sats >88%.  Had discussion with pt and husband (he came in once pt was back in room) about pts functional status and need for SNF for rehab prior to d/c home. Pt does not want to go however husband states he cannot assist pt (walked in with cane himself and he works).  Pt seems to realize she needs SNF but is worried to go due to Covid. Husband and this PT continue to try to encourage further therapy for pt prior to d/c home so that pt is stronger. Pt also still has incr O2 needs which she did not have PTA.   Pt currently with functional limitations due to the deficits listed below (see PT Problem List). Pt will benefit from skilled PT to increase their independence and safety with mobility to allow discharge to the venue listed below.     Follow Up Recommendations  SNF;Supervision/Assistance - 24 hour, HHPT and HHOT if pt refuses SNF     Equipment Recommendations  None recommended by PT, home O2   Recommendations for Other Services       Precautions / Restrictions Precautions Precautions: Fall Restrictions Weight Bearing Restrictions: No    Mobility  Bed Mobility Overal bed mobility: Needs Assistance Bed Mobility: Sit to Supine     Supine to sit: Min assist Sit to supine: Min assist;HOB elevated   General bed mobility comments: Min assist for LE lifting into bed, scooting pt over in bed with use of bed  pad.  Transfers Overall transfer level: Needs assistance Equipment used: Rolling walker (2 wheeled) Transfers: Sit to/from Stand Sit to Stand: Min guard;From elevated surface         General transfer comment: for safety, increased time to rise and steady. VC for hand placement when rising not followed.   Ambulation/Gait Ambulation/Gait assistance: Min guard;Min assist Gait Distance (Feet): 4 Feet Assistive device: 1 person hand held assist Gait Pattern/deviations: Step-through pattern;Decreased stride length;Trunk flexed Gait velocity: decr Gait velocity interpretation: <1.31 ft/sec, indicative of household ambulator General Gait Details: Min guard to min assist for safety to take a few steps to the stairs and then back to the chair with need of HHA for support.     Stairs Stairs: Yes Stairs assistance: Min assist;+2 safety/equipment Stair Management: One rail Right;Step to pattern;Forwards Number of Stairs: 5 General stair comments: Pt has 8 steps at home to get to the bathroom from the kitchen level.  Pt able to ascend and descend 5 steps with assist but with cues for technique with notable left knee instability going up and pt crossing her feet over each other descending steps. Pt with poor safety awareness overall and needed +2 for safety.    Wheelchair Mobility    Modified Rankin (Stroke Patients Only)       Balance Overall balance assessment: Needs assistance Sitting-balance support: No upper extremity supported;Feet supported Sitting balance-Leahy Scale: Fair     Standing balance  support: Bilateral upper extremity supported;During functional activity Standing balance-Leahy Scale: Poor Standing balance comment: relies on UE support for balance.                             Cognition Arousal/Alertness: Awake/alert Behavior During Therapy: WFL for tasks assessed/performed Overall Cognitive Status: Impaired/Different from baseline Area of Impairment:  Safety/judgement;Problem solving                 Orientation Level: Place;Time;Situation   Memory: Decreased short-term memory Following Commands: Follows one step commands inconsistently;Follows one step commands with increased time Safety/Judgement: Decreased awareness of deficits;Decreased awareness of safety   Problem Solving: Requires verbal cues;Requires tactile cues General Comments: Confusion appears better, A&O but still lacks insight into deficits and how mobility issues with make d/c home difficult.      Exercises General Exercises - Lower Extremity Ankle Circles/Pumps: AROM;Both;10 reps;Seated Long Arc Quad: AROM;Both;10 reps;Seated Other Exercises Other Exercises: breathing technique to recover SpO2    General Comments General comments (skin integrity, edema, etc.): Pt sats on arrival with pt on 4LO2 were 91%.  Needed 6LO2 to maintain sats > 88% with activity.        Pertinent Vitals/Pain Pain Assessment: No/denies pain    Home Living                      Prior Function            PT Goals (current goals can now be found in the care plan section) Acute Rehab PT Goals Patient Stated Goal: to go home Progress towards PT goals: Progressing toward goals    Frequency    Min 3X/week      PT Plan Current plan remains appropriate    Co-evaluation              AM-PAC PT "6 Clicks" Mobility   Outcome Measure  Help needed turning from your back to your side while in a flat bed without using bedrails?: A Little Help needed moving from lying on your back to sitting on the side of a flat bed without using bedrails?: A Little Help needed moving to and from a bed to a chair (including a wheelchair)?: A Little Help needed standing up from a chair using your arms (e.g., wheelchair or bedside chair)?: A Little Help needed to walk in hospital room?: A Little Help needed climbing 3-5 steps with a railing? : A Lot 6 Click Score: 17    End of  Session Equipment Utilized During Treatment: Gait belt;Oxygen Activity Tolerance: Patient limited by fatigue Patient left: with call bell/phone within reach;in chair;with chair alarm set;with family/visitor present Nurse Communication: Mobility status PT Visit Diagnosis: Muscle weakness (generalized) (M62.81);Other abnormalities of gait and mobility (R26.89)     Time: 4497-5300 PT Time Calculation (min) (ACUTE ONLY): 36 min  Charges:  $Gait Training: 23-37 mins                     Demetris Capell W,PT [place] Pager:  [phone]  Office:  [place] 05/18/2020, 12:24 PM

## 2020-05-18 NOTE — Progress Notes (Signed)
NAME:  Amy Riddle, MRN:  [id number], DOB:  December 30, 1945, LOS: 8 ADMISSION DATE:  05/10/2020, CONSULTATION DATE: 05/11/20  REFERRING MD:  Dr. Kurtis Bushman / [facility] , CHIEF COMPLAINT:  SOB  Brief History   74 yo female presented with worsening dyspnea for 2 weeks with productive cough, wheezing, leg edema with concern for asthma and CHF exacerbation.  Past Medical History  A-Fib - on eliquis, cardizem.  Has required cardioversion   HTN - on losartan, lasix.  Prior LVEF 40-45% Asthma - on advair  DM II - on metformin  Morbid Obesity  Former Smoker - quit [place] [facility] Events   6/08 Admit  6/09 PCCM consulted   Consults:    Procedures:    Significant Diagnostic Tests:   Limited ECHO 6/9 >> LVEF ~ 40-45%, LV mildly decreased function, LV demonstrates global hypokinesis, interventricular septum flattened in systole consistent with RV pressure overload, RV systolic function moderately reduced, RV is severely enlarged, LA mildly dilated, RA severely dilated  Renal US 6/9 >> no mass or hydronephrosis, small amount left-sided perinephric fluid  V/Q scan 6/11 >> low probability for PE  Micro Data:  COVID 6/8 >> negative   Antimicrobials:  Rocephin 6/9 >>  Azithro 6/9 >> 6/11  Interim history/subjective:   Wear BiPAP overnight.  States that she will try harder to tolerate NIPPV at home nightly  Objective   Blood pressure (!) 94/44, pulse 64, temperature 98.5 F (36.9 C), temperature source Oral, resp. rate 18, height 5' 2.5" (1.588 m), weight 126.2 kg, SpO2 94 %.        Intake/Output Summary (Last 24 hours) at 05/18/2020 1018 Last data filed at 05/18/2020 0436 Gross per 24 hour  Intake 720 ml  Output 2100 ml  Net -1380 ml   Filed Weights   05/17/20 0100 05/18/20 0012 05/18/20 0500  Weight: 124.9 kg 126.2 kg 126.2 kg    Examination: Obese woman, laying comfortably in bed Awake, alert, interacts appropriately, answers questions, moves all extremities HEENT  large neck, no stridor Decreased bilaterally especially bases, no wheeze, no crackles Distant, no murmur Large pannus, obese, nondistended, positive bowel sounds 1+ lower extremity edema  Resolved Hospital Problem list      Assessment & Plan:   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 Continue Dulera Planning for trilogy ventilator for home to wear nightly and while napping Supplemental oxygen as ordered Diuresis as she can tolerate.  Diamox completed Pulmonary/sleep medicine follow-up as an outpatient    Permanent A fib, acute on chronic combined CHF, HTN.  Intake/Output Summary (Last 24 hours) at 05/18/2020 1018 Last data filed at 05/18/2020 0436 Gross per 24 hour  Intake 720 ml  Output 2100 ml  Net -1380 ml   Diuresis as tolerated Eliquis   Urinary tract infection Please stop the ceftriaxone.  She has been on it for 8 days   Best practice:  Diet: heart healthy DVT prophylaxis: eliquis Mobility: as tolerated  Code Status: DNR  Disposition: Progressive,? Home with trilogy soon.    Labs    CMP Latest Ref Rng & Units 05/18/2020 05/17/2020 05/16/2020  Glucose 70 - 99 mg/dL 137(H) 148(H) 156(H)  BUN 8 - 23 mg/dL 46(H) 43(H) 50(H)  Creatinine 0.44 - 1.00 mg/dL 1.17(H) 1.20(H) 1.13(H)  Sodium 135 - 145 mmol/L 143 145 146(H)  Potassium 3.5 - 5.1 mmol/L 4.2 4.4 5.0  Chloride 98 - 111 mmol/L 96(L) 96(L) 98  CO2 22 - 32 mmol/L 35(H) 42(H) 41(H)  Calcium 8.9 - 10.3 mg/dL 9.0 9.1 9.6  Total Protein 6.5 - 8.1 g/dL - - -  Total Bilirubin 0.3 - 1.2 mg/dL - - -  Alkaline Phos 38 - 126 U/L - - -  AST 15 - 41 U/L - - -  ALT 0 - 44 U/L - - -    CBC Latest Ref Rng & Units 05/12/2020 05/10/2020 10/01/2019  WBC 4.0 - 10.5 K/uL 9.9 13.1(H) 7.8  Hemoglobin 12.0 - 15.0 g/dL 15.2(H) 16.3(H) 15.8  Hematocrit 36 - 46 % 51.0(H) 52.5(H) 49.3(H)  Platelets 150 - 400 K/uL 249 344 235    ABG     Component Value Date/Time   PHART 7.337 (L) 05/16/2020 2115   PCO2ART 81.8 (HH) 05/16/2020 2115   PO2ART 83.1 05/16/2020 2115   HCO3 42.5 (H) 05/16/2020 2115   O2SAT 96.1 05/16/2020 2115    CBG (last 3)  Recent Labs    05/17/20 1547 05/17/20 2135 05/18/20 0600  GLUCAP 139* 160* 133*     Signature:    Baltazar Apo, MD, PhD 05/18/2020, 10:24 AM [facility] [phone] or if no answer [phone]

## 2020-05-18 NOTE — Progress Notes (Signed)
Rt placed patient on home Trilogy machine with 5L bleed in.  Patient vitals stable.  RT will continue to monitor.

## 2020-05-19 ENCOUNTER — Inpatient Hospital Stay (HOSPITAL_COMMUNITY): Payer: Medicare Other

## 2020-05-19 LAB — GLUCOSE, CAPILLARY
Glucose-Capillary: 122 mg/dL — ABNORMAL HIGH (ref 70–99)
Glucose-Capillary: 156 mg/dL — ABNORMAL HIGH (ref 70–99)
Glucose-Capillary: 108 mg/dL — ABNORMAL HIGH (ref 70–99)
Glucose-Capillary: 184 mg/dL — ABNORMAL HIGH (ref 70–99)

## 2020-05-19 NOTE — TOC Progression Note (Addendum)
Transition of Care [facility]) - Progression Note    Patient Details  Name: Amy Riddle MRN: [id number] Date of Birth: Oct 25, 1946  Transition of [facility]) CM/SW [place], RN Phone Number: 05/19/2020, 4:33 PM  Clinical Narrative:     Husband came to speak to this CM regarding his wife's refusal to go to SNF. He is concerned as he cannot be at home 24/7 he still works, and he does not feel that he can adequately take care of her.. He states tht he has had surgery on his neck, he walks with a cane, , and she needs to go up stairs to get to bedroom and bathroom. They do not have many people to call on. I spoke to [name]. She states that she does not want to go to rehab. She needs to go home. She has been here 2 weeks. She has the trilogy, which is helping a great deal. She has gotten comfortable with it. She has a plan for a good friend to stay with her. Her daughter lives nearby and can come if needed. She wants to try. She states that if she finds that it does not work out, then she will get additional help or think about rehab. "I think I can do it though" Is already set up with home health ,[facility], and has trilogy, needs home o2 will go through [facility], which gave her trilogy. Will need qualimeters.   Expected Discharge Plan: [place] Barriers to Discharge: Continued Medical Work up  Expected Discharge Plan and Services Expected Discharge Plan: [place] In-house Referral: Clinical Social Work Discharge Planning Services: CM Consult Post Acute Care Choice: [place] arrangements for the past 2 months: Single Family Home                 DME Arranged: [facility] DME Agency: [place] Date DME Agency Contacted: 05/17/20 Time DME Agency Contacted: 1440 Representative spoke with at DME Agency: Magda Paganini HH Arranged: RN, PT, OT, Nurse's Aide, Social Work [employer] Agency: [place] Date [place]:  05/17/20 Time [place]: 1440 Representative spoke with at [place]: [place] ([place]) Interventions    Readmission Risk Interventions No flowsheet data found.

## 2020-05-19 NOTE — Progress Notes (Signed)
SATURATION QUALIFICATIONS: (This note is used to comply with regulatory documentation for home oxygen)  Patient Saturations on Room Air at Rest = 92%  Patient Saturations on Room Air while Ambulating = 87%  Patient Saturations on 3 Liters of oxygen while Ambulating = 92%  Please briefly explain why patient needs home oxygen: 

## 2020-05-19 NOTE — Progress Notes (Signed)
PROGRESS NOTE    Amy Riddle  IRS:[id number] DOB: [birth year] DOA: 05/10/2020 PCP: Leonard Downing, MD    Brief Narrative: Amy Walla Campbellis a 74 y.o.femalewith medical history significant forHx of intermittent asthma, HTN, permanent atrial fibrillation on Eliquis, Type 2 DM, and morbid obesity who presents with worsening shortness of breath.  Patient reports that she has been having ongoing shortness of breath that is worse this week. Worse especially with exertion and better at rest. Had to increase to sleeping on 2 pillows. Has had wheezing, increased cough with sputum production. No fever. Also noted bilateral lower extremity edema. She does not have a formal diagnosis of CHF. She was evaluated by her PCP about 4 days ago and had increases to her Advair and increase of her Lasix from 40 mg to 60 mg. However she did not feel like she had increased urine output from that.  Patient presented presented to ED on CPAP and was found to have O2 saturation in 70s.Not able to wean down to a nonrebreather on 4 L.  Has leukocytosis of 13.1. Creatinine elevated to 2.69 from a prior of 0.70. Elevated bilirubin of 1.7. Anion gap of 18. BNP of 605.4.   CXR shows progressive right pleural effusion with increasing fluid and cardiomegaly with vascular congestion.  She was given Solu-Medrol, DuoNeb and 40 mg Lasix in the ED  Assessment & Plan:   Principal Problem:   Acute CHF (congestive heart failure) ([place]) Active Problems:   Diabetes ([place])   Hypotension   AKI (acute kidney injury) ([place])   Permanent atrial fibrillation (HCC)   Cirrhosis (HCC)   Respiratory distress   Pleural effusion   Palliative care by specialist   DNR (do not resuscitate)   Goals of care, counseling/discussion   Acute heart failure ([place])   Chronic respiratory failure with hypoxia and hypercapnia ([place])   #1 acute on chronic hypoxic hypercapnic respiratory failure secondary to CHF and  asthma exacerbation with morbid obesity likely sleep apnea and obesity hypoventilation-she has not had a chest x-ray since the 12th.  Will order a chest x-ray for today. She was able to tolerate BiPAP for few hours last night. Continue Lasix, Xopenex Titrate to keep her oxygen above 88% Continue Brovana Pulmicort She finished a course of antibiotics with Rocephin and azithromycin. BiPAP nightly and as needed Echo ejection fraction 40 to 45% 6/9 Finished Diamox Follow-up with pulmonary as an outpatient for sleep study Discussed with patient's husband who has had recent shoulder surgery and is concerned that he will not be able to care for her at home.  He is requesting the patient to go to rehab. Follow-up with palliative care as an outpatient. Trelegy upon discharge. Chest x-ray-6/12-persistent right base retrocardiac opacification likely effusion/atelectasis I's and O's-negative by10.9 L from  9.5 L yest ABG 05/16/2020 7.33/81/83  #2 AKI monitor closely on Lasix creatinine 1.17  #3 chronic atrial fibrillation on Cardizem and Eliquis continue  #4 type 2 diabetes on SSI CBG (last 3)  Recent Labs    05/18/20 2113 05/19/20 0621 05/19/20 1118  GLUCAP 179* 122* 156*      #5 goals of care patient is DNR.  Patient is adamant about going home.  She is awake alert and oriented this morning when she is had a long conversation with me and it is her wish that she should go home and she wants to go home.  She told me that she has a childhood friend who would come and be with  her during the day when her husband is working.  She will get home health physical therapy upon discharge.  However she is not ready to be discharged today yet she still on 6 L of high flow nasal cannula. Her husband would like her to go to rehab since he thinks he cannot or will not be able to care for her at home.  Nutrition Problem: Inadequate oral intake Etiology: lethargy/confusion     Signs/Symptoms: meal  completion < 25%    Interventions: Ensure Enlive (each supplement provides 350kcal and 20 grams of protein), MVI, Magic cup  Estimated body mass index is 49.76 kg/m as calculated from the following:   Height as of this encounter: 5' 2.5" (1.588 m).   Weight as of this encounter: 125.4 kg.   DVT prophylaxis: Eliquis Code Status: DNR Family Communication: Discussed with her husband Disposition Plan:  Status is: Inpatient  Dispo: The patient is from: Home  Anticipated d/c is to: SNF  Anticipated d/c date is: 2 to 3 days  Patient currently is not medically stable to d/c.  Patient on IV diuresis for hypoxic hypercapnic respiratory failure due to CHF and asthma on nebulizers    Consultants:   PCCM and palliative care  Procedures: None Antimicrobials: Finished course of azithromycin and Rocephin   Subjective: Patient up in chair eating breakfast awake alert reports that she used BiPAP last night for few hours and she was able to tolerate it better she is anxious to know if she can go home today. Objective: Vitals:   05/19/20 0022 05/19/20 0400 05/19/20 0700 05/19/20 1116  BP: 119/75   (!) 111/98  Pulse: 76     Resp: 20 17    Temp: 98.7 F (37.1 C) 98 F (36.7 C) 97.9 F (36.6 C) 98.2 F (36.8 C)  TempSrc: Oral Oral Oral Oral  SpO2: 94% 96%  95%  Weight:  125.4 kg    Height:        Intake/Output Summary (Last 24 hours) at 05/19/2020 1303 Last data filed at 05/19/2020 0830 Gross per 24 hour  Intake 720 ml  Output 2650 ml  Net -1930 ml   Filed Weights   05/18/20 0012 05/18/20 0500 05/19/20 0400  Weight: 126.2 kg 126.2 kg 125.4 kg    Examination:  General exam: Appears calm and comfortable  Respiratory system: Diminished breath sounds at the bases to auscultation. Respiratory effort normal. Cardiovascular system: S1 & S2 heard, RRR. No JVD, murmurs, rubs, gallops or clicks. No pedal edema. Gastrointestinal system: Abdomen is  nondistended, soft and nontender. No organomegaly or masses felt. Normal bowel sounds heard. Central nervous system: Alert and oriented. No focal neurological deficits. Extremities 1+ pitting edema  skin: No rashes, lesions or ulcers Psychiatry: Judgement and insight appear normal. Mood & affect appropriate.     Data Reviewed: I have personally reviewed following labs and imaging studies  CBC: No results for input(s): WBC, NEUTROABS, HGB, HCT, MCV, PLT in the last 168 hours. Basic Metabolic Panel: Recent Labs  Lab 05/13/20 0735 05/13/20 0735 05/14/20 0451 05/15/20 0715 05/16/20 0739 05/17/20 0611 05/18/20 0443  NA 140   < > 144 145 146* 145 143  K 5.2*   < > 5.3* 5.6* 5.0 4.4 4.2  CL 95*   < > 99 101 98 96* 96*  CO2 35*   < > 36* 37* 41* 42* 35*  GLUCOSE 176*   < > 161* 147* 156* 148* 137*  BUN 97*   < >  81* 64* 50* 43* 46*  CREATININE 1.78*   < > 1.41* 1.18* 1.13* 1.20* 1.17*  CALCIUM 9.6   < > 9.9 9.7 9.6 9.1 9.0  MG 2.6*  --  2.8* 2.7* 2.3  --   --   PHOS  --   --  2.9  --   --   --   --    < > = values in this interval not displayed.   GFR: Estimated Creatinine Clearance: 56.3 mL/min (A) (by C-G formula based on SCr of 1.17 mg/dL (H)). Liver Function Tests: No results for input(s): AST, ALT, ALKPHOS, BILITOT, PROT, ALBUMIN in the last 168 hours. No results for input(s): LIPASE, AMYLASE in the last 168 hours. No results for input(s): AMMONIA in the last 168 hours. Coagulation Profile: No results for input(s): INR, PROTIME in the last 168 hours. Cardiac Enzymes: No results for input(s): CKTOTAL, CKMB, CKMBINDEX, TROPONINI in the last 168 hours. BNP (last 3 results) No results for input(s): PROBNP in the last 8760 hours. HbA1C: No results for input(s): HGBA1C in the last 72 hours. CBG: Recent Labs  Lab 05/18/20 1215 05/18/20 1703 05/18/20 2113 05/19/20 0621 05/19/20 1118  GLUCAP 228* 128* 179* 122* 156*   Lipid Profile: No results for input(s): CHOL, HDL,  LDLCALC, TRIG, CHOLHDL, LDLDIRECT in the last 72 hours. Thyroid Function Tests: No results for input(s): TSH, T4TOTAL, FREET4, T3FREE, THYROIDAB in the last 72 hours. Anemia Panel: No results for input(s): VITAMINB12, FOLATE, FERRITIN, TIBC, IRON, RETICCTPCT in the last 72 hours. Sepsis Labs: No results for input(s): PROCALCITON, LATICACIDVEN in the last 168 hours.  Recent Results (from the past 240 hour(s))  SARS Coronavirus 2 by RT PCR (hospital order, performed in [facility] hospital lab) Nasopharyngeal Nasopharyngeal Swab     Status: None   Collection Time: 05/10/20  8:31 PM   Specimen: Nasopharyngeal Swab  Result Value Ref Range Status   SARS Coronavirus 2 NEGATIVE NEGATIVE Final    Comment: (NOTE) SARS-CoV-2 target nucleic acids are NOT DETECTED. The SARS-CoV-2 RNA is generally detectable in upper and lower respiratory specimens during the acute phase of infection. The lowest concentration of SARS-CoV-2 viral copies this assay can detect is 250 copies / mL. A negative result does not preclude SARS-CoV-2 infection and should not be used as the sole basis for treatment or other patient management decisions.  A negative result may occur with improper specimen collection / handling, submission of specimen other than nasopharyngeal swab, presence of viral mutation(s) within the areas targeted by this assay, and inadequate number of viral copies (<250 copies / mL). A negative result must be combined with clinical observations, patient history, and epidemiological information. Fact Sheet for Patients:   StrictlyIdeas.no Fact Sheet for Healthcare Providers: BankingDealers.co.za This test is not yet approved or cleared  by the [place] FDA and has been authorized for detection and/or diagnosis of SARS-CoV-2 by FDA under an Emergency Use Authorization (EUA).  This EUA will remain in effect (meaning this test can be used) for the  duration of the COVID-19 declaration under Section 564(b)(1) of the Act, 21 U.S.C. section 360bbb-3(b)(1), unless the authorization is terminated or revoked sooner. Performed at [facility], [place] [address], [place], [place] [address]   Culture, Urine     Status: Abnormal   Collection Time: 05/11/20  1:58 AM   Specimen: Urine, Catheterized  Result Value Ref Range Status   Specimen Description URINE, CATHETERIZED  Final   Special Requests ADDED  Final  Culture (A)  Final    >=100,000 COLONIES/mL GROUP B STREP(S.AGALACTIAE)ISOLATED TESTING AGAINST S. AGALACTIAE NOT ROUTINELY PERFORMED DUE TO PREDICTABILITY OF AMP/PEN/VAN SUSCEPTIBILITY. Performed at [facility], [place] [address] [place], [place] [address]    Report Status 05/13/2020 FINAL  Final  Culture, blood (Routine X 2) w Reflex to ID Panel     Status: None   Collection Time: 05/11/20  3:20 PM   Specimen: BLOOD  Result Value Ref Range Status   Specimen Description BLOOD RIGHT ANTECUBITAL  Final   Special Requests   Final    BOTTLES DRAWN AEROBIC AND ANAEROBIC Blood Culture adequate volume   Culture   Final    NO GROWTH 5 DAYS Performed at [facility], [place] [address], [place], [place] [address]    Report Status 05/16/2020 FINAL  Final  Culture, blood (Routine X 2) w Reflex to ID Panel     Status: None   Collection Time: 05/11/20  3:20 PM   Specimen: BLOOD RIGHT HAND  Result Value Ref Range Status   Specimen Description BLOOD RIGHT HAND  Final   Special Requests   Final    BOTTLES DRAWN AEROBIC ONLY Blood Culture adequate volume   Culture   Final    NO GROWTH 5 DAYS Performed at [facility], [place] [address], [place], [place] [address]    Report Status 05/16/2020 FINAL  Final         Radiology Studies: No results found.      Scheduled Meds: . apixaban  5 mg Oral BID  . chlorhexidine  15 mL Mouth Rinse BID  . Chlorhexidine Gluconate Cloth  6 each Topical Daily  . diltiazem  360 mg  Oral Daily  . feeding supplement (NEPRO CARB STEADY)  237 mL Oral TID WC  . feeding supplement (PRO-STAT SUGAR FREE 64)  30 mL Oral BID WC  . furosemide  40 mg Intravenous BID  . insulin aspart  0-9 Units Subcutaneous TID WC  . mouth rinse  15 mL Mouth Rinse q12n4p  . metoprolol tartrate  12.5 mg Oral Q6H  . mometasone-formoterol  2 puff Inhalation BID  . multivitamin with minerals  1 tablet Oral Daily  . sodium chloride flush  3 mL Intravenous Q12H   Continuous Infusions: . sodium chloride       LOS: 9 days     Georgette Shell, MD  05/19/2020, 1:03 PM

## 2020-05-20 ENCOUNTER — Telehealth: Payer: Self-pay

## 2020-05-20 LAB — GLUCOSE, CAPILLARY
Glucose-Capillary: 130 mg/dL — ABNORMAL HIGH (ref 70–99)
Glucose-Capillary: 157 mg/dL — ABNORMAL HIGH (ref 70–99)

## 2020-05-20 MED ORDER — LORAZEPAM 0.5 MG PO TABS: 0.2500 mg | ORAL_TABLET | Freq: Two times a day (BID) | ORAL | 0 refills | Status: DC | PRN

## 2020-05-20 MED ORDER — LEVALBUTEROL HCL 1.25 MG/0.5ML IN NEBU: 0.6300 mg | INHALATION_SOLUTION | Freq: Four times a day (QID) | RESPIRATORY_TRACT | 12 refills | Status: DC | PRN

## 2020-05-20 NOTE — Progress Notes (Signed)
NAME:  Amy Riddle, MRN:  [id number], DOB:  1946-06-25, LOS: 78 ADMISSION DATE:  05/10/2020, CONSULTATION DATE: 05/11/20  REFERRING MD:  Dr. Kurtis Bushman / [facility] , CHIEF COMPLAINT:  SOB  Brief History   74 yo female presented with worsening dyspnea for 2 weeks with productive cough, wheezing, leg edema with concern for asthma and CHF exacerbation.  Past Medical History  A-Fib - on eliquis, cardizem.  Has required cardioversion   HTN - on losartan, lasix.  Prior LVEF 40-45% Asthma - on advair  DM II - on metformin  Morbid Obesity  Former Smoker - quit [facility]   6/08 Admit  6/09 PCCM consulted   Consults:    Procedures:    Significant Diagnostic Tests:   Limited ECHO 6/9 >> LVEF ~ 40-45%, LV mildly decreased function, LV demonstrates global hypokinesis, interventricular septum flattened in systole consistent with RV pressure overload, RV systolic function moderately reduced, RV is severely enlarged, LA mildly dilated, RA severely dilated  Renal US 6/9 >> no mass or hydronephrosis, small amount left-sided perinephric fluid  V/Q scan 6/11 >> low probability for PE  Micro Data:  COVID 6/8 >> negative   Antimicrobials:  Rocephin 6/9 >>  Azithro 6/9 >> 6/11  Interim history/subjective:   Patient states that she has been wearing trilogy device every evening, likes the mask, likes the fit.  She is definitely willing to wear it at home  Objective   Blood pressure 105/65, pulse 96, temperature 97.7 F (36.5 C), temperature source Oral, resp. rate 20, height 5' 2.5" (1.588 m), weight 123.5 kg, SpO2 96 %.        Intake/Output Summary (Last 24 hours) at 05/20/2020 0958 Last data filed at 05/20/2020 8841 Gross per 24 hour  Intake 360 ml  Output 2275 ml  Net -1915 ml   Filed Weights   05/18/20 0500 05/19/20 0400 05/20/20 0644  Weight: 126.2 kg 125.4 kg 123.5 kg    Examination: Obese woman, sitting up at bedside Awake, alert, interacts  appropriately, answers questions, moves all extremities HEENT large neck, no stridor Decreased bilaterally, few inspiratory crackles at both bases, no wheezing Distant, no murmur Unchanged large pannus, obese, nondistended, positive bowel sounds 1+ lower extremity edema  Resolved Hospital Problem list      Assessment & Plan:   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 Continue Dulera Agree with discharge to home with trilogy ventilator for her multifactorial chronic hypoxic and hypercapnic respiratory failure Has benefited from diuresis, continue as able Follow-up arranged with Dr. Elsworth Soho 07/25/2020 at 930am   Permanent A fib, acute on chronic combined CHF, HTN.  Intake/Output Summary (Last 24 hours) at 05/20/2020 0958 Last data filed at 05/20/2020 6606 Gross per 24 hour  Intake 360 ml  Output 2275 ml  Net -1915 ml   Continue Diuresis as tolerated Continue Eliquis   Urinary tract infection Completed ceftriaxone    Best practice:  Diet: heart healthy DVT prophylaxis: eliquis Mobility: as tolerated  Code Status: DNR  Disposition: Progressive,? Home with trilogy soon.    Labs    CMP Latest Ref Rng & Units 05/18/2020 05/17/2020 05/16/2020  Glucose 70 - 99 mg/dL 137(H) 148(H) 156(H)  BUN 8 - 23 mg/dL 46(H) 43(H) 50(H)  Creatinine 0.44 - 1.00 mg/dL 1.17(H) 1.20(H) 1.13(H)  Sodium 135 - 145 mmol/L 143 145 146(H)  Potassium 3.5 - 5.1 mmol/L 4.2 4.4 5.0  Chloride 98 -  111 mmol/L 96(L) 96(L) 98  CO2 22 - 32 mmol/L 35(H) 42(H) 41(H)  Calcium 8.9 - 10.3 mg/dL 9.0 9.1 9.6  Total Protein 6.5 - 8.1 g/dL - - -  Total Bilirubin 0.3 - 1.2 mg/dL - - -  Alkaline Phos 38 - 126 U/L - - -  AST 15 - 41 U/L - - -  ALT 0 - 44 U/L - - -    CBC Latest Ref Rng & Units 05/12/2020 05/10/2020 10/01/2019  WBC 4.0 - 10.5 K/uL 9.9 13.1(H) 7.8  Hemoglobin 12.0 - 15.0 g/dL 15.2(H) 16.3(H) 15.8  Hematocrit  36 - 46 % 51.0(H) 52.5(H) 49.3(H)  Platelets 150 - 400 K/uL 249 344 235    ABG    Component Value Date/Time   PHART 7.337 (L) 05/16/2020 2115   PCO2ART 81.8 (HH) 05/16/2020 2115   PO2ART 83.1 05/16/2020 2115   HCO3 42.5 (H) 05/16/2020 2115   O2SAT 96.1 05/16/2020 2115    CBG (last 3)  Recent Labs    05/19/20 1637 05/19/20 2025 05/20/20 0608  GLUCAP 108* 184* 130*     Signature:    Baltazar Apo, MD, PhD 05/20/2020, 9:58 AM [facility] [phone] or if no answer [phone]

## 2020-05-20 NOTE — Telephone Encounter (Signed)
Received a message stating that Mrs. Campbells husband wishes to speak to Case Manager. Called cell phone as requested left message for him to return call.

## 2020-05-20 NOTE — TOC Transition Note (Signed)
Transition of Care [facility]) - CM/SW Discharge Note   Patient Details  Name: Amy Riddle MRN: [id number] Date of Birth: 1946/11/21  Transition of Care [facility]) CM/SW Contact:  Verdell Carmine, RN Phone Number: 05/20/2020, 10:07 AM   Clinical Narrative:    [name] to Mr Wheeland and [name] separately several times. Both have reasons for their feelings of placement. [name] wants to go home, for her mental health, and comfort, to be in her place, her own bed. [name], self proclaimed realist, is very concerned that something will happen to her, that it is not a safe situation, that he will not be able to take care of her, he is not well himself, had surgery a few months ago on his neck, walks with a cane. [name] states that if it does not work out that she will "change it up," but she doe snot want to go tho rehab. She has a friend that will be with her that is willing. When her husband is not there.  Social work is consulted and she will have [name] by [facility] Lemmings. Has Home o2 and trilogy at night. All attempts made to make the transition as safe as possible.    Final next level of care: Home w Home Health Services Barriers to Discharge: No Barriers Identified   Patient Goals and CMS Choice Patient states their goals for this hospitalization and ongoing recovery are:: GOing home, has friend that will stay with her when her husband is at work [employer].gov Compare Post Acute Care list provided to:: Patient Represenative (must comment) Elenore Rota) Choice offered to / list presented to : Patient  Discharge Placement  Home with Home health, Trilogy and home o2 by [employer]                     Discharge Plan and Services In-house Referral: Clinical Social Work Discharge Planning Services: CM Consult Post Acute Care Choice: Home Health          DME Arranged: Oxygen DME Agency: [place] Date DME Agency Contacted: 05/17/20 Time DME Agency  Contacted: 1440 Representative spoke with at DME Agency: Magda Paganini HH Arranged: RN, PT, OT, Nurse's Aide, Social Work [employer] Agency: [place] Date Roundup: 05/17/20 Time [place]: 1440 Representative spoke with at [place]: [place] ([place]) Interventions     Readmission Risk Interventions No flowsheet data found.

## 2020-05-20 NOTE — Discharge Summary (Signed)
Physician Discharge Summary  Amy Riddle FGH:[id number] DOB: 02-Aug-1946 DOA: 05/10/2020  PCP: Leonard Downing, MD  Admit date: 05/10/2020 Discharge date: 05/20/2020  Admitted From: Home Disposition: Home Recommendations for Outpatient Follow-up:  1. Follow up with PCP in 1-2 weeks 2. Please obtain BMP/CBC in one week 3. Please follow up with Dr. Elsworth Soho 07/25/2020 at 9:30 AM  Home Health: Yes Equipment/Devices: 3 L of oxygen Discharge Condition stable CODE STATUS DNR Diet recommendation: Cardiac Brief/Interim Summary:Amy F Campbellis a 74 y.o.femalewith medical history significant forHx of intermittent asthma, HTN, permanent atrial fibrillation on Eliquis, Type 2 DM, and morbid obesity who presents with worsening shortness of breath.  Patient reports that she has been having ongoing shortness of breath that is worse this week. Worse especially with exertion and better at rest. Had to increase to sleeping on 2 pillows. Has had wheezing, increased cough with sputum production. No fever. Also noted bilateral lower extremity edema. She does not have a formal diagnosis of CHF. She was evaluated by her PCP about 4 days ago and had increases to her Advair and increase of her Lasix from 40 mg to 60 mg. However she did not feel like she had increased urine output from that.  Patient presented presented to ED on CPAP and was found to have O2 saturation in 70s.Not able to wean down to a nonrebreather on 4 L.  Has leukocytosis of 13.1. Creatinine elevated to 2.69 from a prior of 0.70. Elevated bilirubin of 1.7. Anion gap of 18. BNP of 605.4.   CXR shows progressive right pleural effusion with increasing fluid and cardiomegaly with vascular congestion.  She was given Solu-Medrol, DuoNeb and 40 mg Lasix in the ED Discharge Diagnoses:  Principal Problem:   Acute CHF (congestive heart failure) ([place]) Active Problems:   Diabetes ([place])   Hypotension   AKI (acute kidney  injury) ([place])   Permanent atrial fibrillation ([facility])   Cirrhosis ([place])   Respiratory distress   Pleural effusion   Palliative care by specialist   DNR (do not resuscitate)   Goals of care, counseling/discussion   Acute heart failure ([place])   Chronic respiratory failure with hypoxia and hypercapnia ([place])     #1 acute on chronic hypoxic hypercapnic respiratory failure secondary to CHF and asthma exacerbation with morbid obesity likely sleep apnea and obesity hypoventilation- She was admitted to the hospital treated with Lasix, nebulizers, Brovana and Pulmicort along with a course of Rocephin and azithromycin.  She was also on BiPAP every night and as needed which she tolerated prior to discharge.   She will be discharged home today with trilogy and 3 L of oxygen.  Chest x-ray day prior to discharge showed mild interval improvement moderate right base opacification likely effusion with atelectasis and stable cardiomegaly.  She was seen in consultation by PCCM and palliative care.   Echo ejection fraction 40 to 45%6/9 Follow-up with pulmonary as an outpatient for sleep study Patient will be discharged home with PT OT aide and social worker. She was seen in consultation by PT in the hospital and recommended SNF however patient adamantly refused going to SNF.  #2 AKI -stable and resolved.    #3 chronic atrial fibrillation on Cardizem and Eliquis continue  #4 type 2 diabetes on SSI continue home meds    Nutrition Problem: Inadequate oral intake Etiology: lethargy/confusion    Signs/Symptoms: meal completion < 25%     Interventions: Ensure Enlive (each supplement provides 350kcal and 20 grams of protein), MVI, Magic  cup  Estimated body mass index is 49 kg/m as calculated from the following:   Height as of this encounter: 5' 2.5" (1.588 m).   Weight as of this encounter: 123.5 kg.  Discharge Instructions  Discharge Instructions    Call MD for:  difficulty breathing, headache  or visual disturbances   Complete by: As directed    Call MD for:  persistant nausea and vomiting   Complete by: As directed    Diet - low sodium heart healthy   Complete by: As directed    Increase activity slowly   Complete by: As directed      Allergies as of 05/20/2020      Reactions   Tylenol Arthritis Ext [acetaminophen] Other (See Comments)   Does not help      Medication List    STOP taking these medications   losartan 50 MG tablet Commonly known as: COZAAR     TAKE these medications   acetaminophen 500 MG tablet Commonly known as: TYLENOL Take 1,000 mg by mouth every 8 (eight) hours as needed for mild pain or moderate pain.   Advair Diskus 500-50 MCG/DOSE Aepb Generic drug: Fluticasone-Salmeterol Inhale 1 puff into the lungs 2 (two) times daily.   albuterol 108 (90 Base) MCG/ACT inhaler Commonly known as: VENTOLIN HFA Inhale 1-2 puffs into the lungs 2 (two) times daily as needed for shortness of breath.   Clobetasol Prop Emollient Base 0.05 % emollient cream Apply 1 application topically daily as needed (eczema).   diclofenac sodium 1 % Gel Commonly known as: VOLTAREN Apply 2 g topically 2 (two) times daily.   diltiazem 360 MG 24 hr capsule Commonly known as: CARDIZEM CD Take 1 capsule (360 mg total) by mouth daily.   Eliquis 5 MG Tabs tablet Generic drug: apixaban Take 1 tablet (5 mg total) by mouth 2 (two) times daily.   furosemide 40 MG tablet Commonly known as: LASIX Take 40 mg by mouth 2 (two) times daily.   levalbuterol 1.25 MG/0.5ML nebulizer solution Commonly known as: XOPENEX Take 0.63 mg by nebulization every 6 (six) hours as needed for wheezing or shortness of breath.   loratadine 10 MG tablet Commonly known as: CLARITIN Take 10 mg by mouth daily as needed for allergies.   LORazepam 0.5 MG tablet Commonly known as: ATIVAN Take 0.5 tablets (0.25 mg total) by mouth 2 (two) times daily as needed for anxiety or sleep.   metFORMIN 1000  MG tablet Commonly known as: GLUCOPHAGE Take 500-1,000 mg by mouth See admin instructions. Take 1000 mg in the morning and 500 mg in the evening   nystatin cream Commonly known as: MYCOSTATIN Apply 1 application topically daily as needed (yeast under breast).            Durable Medical Equipment  (From admission, onward)         Start     Ordered   05/19/20 1827  For home use only DME oxygen  Once       Comments: Bleed into NIV as well  Question Answer Comment  Length of Need Lifetime   Mode or (Route) Nasal cannula   Liters per Minute 3   Oxygen conserving device Yes   Oxygen delivery system Gas      05/19/20 1827          Follow-up Information    [facility] Follow up.   Specialty: Home Health Services Why: for home health services  Contact information: [place]  [address] [place] [address] [phone]        [facility] Follow up.   Why: For outpatient palliative services. They will call a few days after discharge to arrange a time to meet with you Contact information: [place], Wilson Oliver, MD. Go on 05/25/2020.   Specialty: Family Medicine Why: @10 :Max Sane information: [place] [address] [phone]        Rigoberto Noel, MD Follow up on 07/25/2020.   Specialty: Pulmonary Disease Why: 9:30am Contact information: [address] [place] [address] [phone]              Allergies  Allergen Reactions  . Tylenol Arthritis Ext [Acetaminophen] Other (See Comments)    Does not help    Consultations:  PCCM   Procedures/Studies: DG Chest 1 View  Result Date: 05/19/2020 CLINICAL DATA:  Hypoxemia. EXAM: CHEST  1 VIEW COMPARISON:  05/14/2020 FINDINGS: Lungs are adequately inflated demonstrate opacification over the right base with slightly better aeration in the right base. Findings are likely due to  effusion and atelectasis. Stable cardiomegaly. Remainder the exam is unchanged IMPRESSION: 1. Mild interval improvement moderate right base opacification likely effusion with atelectasis. 2. Stable cardiomegaly. Electronically Signed   By: Marin Olp M.D.   On: 05/19/2020 15:18   DG Chest 1 View  Result Date: 05/12/2020 CLINICAL DATA:  Pleural effusion.  History of asthma EXAM: CHEST  1 VIEW COMPARISON:  05/10/2020 FINDINGS: Cardiopericardial enlargement and vascular pedicle widening. Pleural effusion on the right with pulmonary opacity/obscuration. No pneumothorax IMPRESSION: Unchanged cardiomegaly and right pleural effusion. Electronically Signed   By: Monte Fantasia M.D.   On: 05/12/2020 08:57   DG Chest 2 View  Result Date: 05/06/2020 CLINICAL DATA:  Cough and short of breath EXAM: CHEST - 2 VIEW COMPARISON:  08/19/2019 FINDINGS: Cardiac enlargement with mild vascular congestion. Small to moderate right pleural effusion with right lower lobe atelectasis. Left lung clear. IMPRESSION: Mild to moderate right pleural effusion with vascular congestion. Findings suggest mild fluid overload. Electronically Signed   By: Franchot Gallo M.D.   On: 05/06/2020 15:33   NM Pulmonary Perfusion  Result Date: 05/13/2020 CLINICAL DATA:  Hypoxia.  Shortness of breath. EXAM: NUCLEAR MEDICINE PERFUSION LUNG SCAN TECHNIQUE: Perfusion images were obtained in multiple projections after intravenous injection of radiopharmaceutical. Ventilation scans not performed, patient with altered mental status and had difficulty tolerating the exam, terminated the exam after perfusion imaging. RADIOPHARMACEUTICALS:  1.6 mCi Tc-23m MAA IV COMPARISON:  Chest radiograph 05/12/2020 FINDINGS: An oblique area of linear photopenia in the right lung parallels the fissure and likely corresponds to fluid in the fissure as seen on radiograph. Photopenia at the right costophrenic angle related to pleural effusion. There is otherwise homogeneous  distribution of radiotracer throughout the lungs. No peripheral or wedge-shaped perfusion defects. IMPRESSION: 1. Low probability for pulmonary embolus. 2. Right pleural effusion with fluid in the fissure. Electronically Signed   By: Keith Rake M.D.   On: 05/13/2020 17:01   US RENAL  Result Date: 05/11/2020 CLINICAL DATA:  Acute renal insufficiency. EXAM: RENAL / URINARY TRACT ULTRASOUND COMPLETE COMPARISON:  None. FINDINGS: Right Kidney: Renal measurements: 11.2 cm x 6.3 cm x 6.6 cm = volume: 242.5 mL . Echogenicity within normal limits. No mass or hydronephrosis visualized. Left Kidney: Renal measurements: 11.9 cm x 6.2 cm x 6.4 cm = volume: 246.6 mL. Echogenicity  within normal limits. No mass or hydronephrosis visualized. A small amount of perinephric fluid is seen on the left. Bladder: The urinary bladder is not clearly visualized. Other: None. IMPRESSION: Small amount of left-sided perinephric fluid. Electronically Signed   By: Virgina Norfolk M.D.   On: 05/11/2020 16:26   DG Chest Port 1 View  Result Date: 05/14/2020 CLINICAL DATA:  Abnormal respirations. EXAM: PORTABLE CHEST 1 VIEW COMPARISON:  05/12/2020 FINDINGS: Patient is moderately rotated to the right. Lungs are adequately inflated without focal airspace consolidation or effusion. Inferolateral right lung base cut off the image. Moderate stable cardiomegaly. Persistent opacification in the right retrocardiac region likely atelectasis/effusion. Remainder the exam is unchanged. IMPRESSION: Persistent right base/retrocardiac opacification likely effusion/atelectasis. Moderate stable cardiomegaly. Electronically Signed   By: Marin Olp M.D.   On: 05/14/2020 10:30   DG Chest Port 1 View  Result Date: 05/10/2020 CLINICAL DATA:  Shortness of breath and chest pain. EXAM: PORTABLE CHEST 1 VIEW COMPARISON:  Radiograph 4 days ago 05/06/2020, additional priors. FINDINGS: Cardiomegaly. Progressive right pleural effusion with increasing fluid in  the minor fissure. Increasing right lung base opacity likely compressive atelectasis. Aortic atherosclerosis. Vascular congestion. No pneumothorax. IMPRESSION: 1. Progressive right pleural effusion with increasing fluid in the minor fissure over the past 4 days. 2. Cardiomegaly with vascular congestion. Electronically Signed   By: Keith Rake M.D.   On: 05/10/2020 20:42   ECHOCARDIOGRAM COMPLETE  Result Date: 05/11/2020    ECHOCARDIOGRAM REPORT   Patient Name:   Amy Riddle Date of Exam: 05/11/2020 Medical Rec #:  [id number]            Height:       62.0 in Accession #:    [id number]           Weight:       284.0 lb Date of Birth:  [birth year]            BSA:          2.219 m Patient Age:    3 years             BP:           121/60 mmHg Patient Gender: F                    HR:           67 bpm. Exam Location:  Inpatient Procedure: 2D Echo, Cardiac Doppler and Color Doppler Indications:    CHF-Acute Systolic 867.61 / P50.93  History:        Patient has prior history of Echocardiogram examinations, most                 recent 09/01/2019. CHF, Arrythmias:Atrial Fibrillation; Risk                 Factors:Hypertension, Diabetes and Former Smoker.  Sonographer:    Vickie Epley RDCS Referring Phys: [id number] [place] T TU  Sonographer Comments: No subcostal window. IMPRESSIONS  1. Left ventricular ejection fraction, by estimation, is 40 to 45%. The left ventricle has mildly decreased function. The left ventricle demonstrates global hypokinesis. Left ventricular diastolic function could not be evaluated. There is the interventricular septum is flattened in systole, consistent with right ventricular pressure overload.  2. Right ventricular systolic function is moderately reduced. The right ventricular size is severely enlarged. Tricuspid regurgitation signal is inadequate for assessing PA pressure.  3. Left atrial size was mildly dilated.  4. Right atrial size was  severely dilated.  5. The mitral valve is normal in  structure. Trivial mitral valve regurgitation.  6. The aortic valve was not well visualized. Aortic valve regurgitation is not visualized. No aortic stenosis is present. Conclusion(s)/Recommendation(s): LV EF reduced, but RV size/function is severely abnormal. TR ject inadequate for assessing PA pressure, and IVC not well seen. On direct comparison to prior, RV size/function appeared mildly abnormal before, but this is a  significant change. Concerning for RV pressure overload. No definitive McConnell's sign but appears consistent with pulmonary hypertension or PE. FINDINGS  Left Ventricle: Left ventricular ejection fraction, by estimation, is 40 to 45%. The left ventricle has mildly decreased function. The left ventricle demonstrates global hypokinesis. The left ventricular internal cavity size was normal in size. There is  no left ventricular hypertrophy. The interventricular septum is flattened in systole, consistent with right ventricular pressure overload. Left ventricular diastolic function could not be evaluated due to atrial fibrillation. Left ventricular diastolic function could not be evaluated. Right Ventricle: The right ventricular size is severely enlarged. No increase in right ventricular wall thickness. Right ventricular systolic function is moderately reduced. Tricuspid regurgitation signal is inadequate for assessing PA pressure. Left Atrium: Left atrial size was mildly dilated. Right Atrium: Right atrial size was severely dilated. Pericardium: A small pericardial effusion is present. Mitral Valve: The mitral valve is normal in structure. Trivial mitral valve regurgitation. Tricuspid Valve: The tricuspid valve is normal in structure. Tricuspid valve regurgitation is trivial. No evidence of tricuspid stenosis. Aortic Valve: The aortic valve was not well visualized. . There is mild thickening and mild calcification of the aortic valve. Aortic valve regurgitation is not visualized. No aortic stenosis  is present. Mild aortic valve annular calcification. There is mild thickening of the aortic valve. There is mild calcification of the aortic valve. Pulmonic Valve: The pulmonic valve was not well visualized. Pulmonic valve regurgitation is not visualized. No evidence of pulmonic stenosis. Aorta: The aortic root is normal in size and structure. Venous: The inferior vena cava was not well visualized. IAS/Shunts: The atrial septum is grossly normal.  LEFT VENTRICLE PLAX 2D LVOT diam:     2.50 cm LV SV:         50 LV SV Index:   22 LVOT Area:     4.91 cm  LV Volumes (MOD) LV vol d, MOD A2C: 67.3 ml LV vol d, MOD A4C: 69.8 ml LV vol s, MOD A2C: 38.7 ml LV vol s, MOD A4C: 44.3 ml LV SV MOD A2C:     28.6 ml LV SV MOD A4C:     69.8 ml LV SV MOD BP:      27.4 ml RIGHT VENTRICLE TAPSE (M-mode): 1.1 cm LEFT ATRIUM             Index       RIGHT ATRIUM           Index LA Vol (A2C):   46.2 ml 20.80 ml/m RA Area:     27.00 cm LA Vol (A4C):   54.5 ml 24.56 ml/m RA Volume:   97.10 ml  43.76 ml/m LA Biplane Vol: 54.7 ml 24.65 ml/m  AORTIC VALVE LVOT Vmax:   73.20 cm/s LVOT Vmean:  45.600 cm/s LVOT VTI:    0.101 m  AORTA Ao Root diam: 3.40 cm  SHUNTS Systemic VTI:  0.10 m Systemic Diam: 2.50 cm Buford Dresser MD Electronically signed by Buford Dresser MD Signature Date/Time: 05/11/2020/9:52:49 PM    Final    VAS  [place] LOWER EXTREMITY VENOUS (DVT)  Result Date: 05/12/2020  Lower Venous DVTStudy Indications: Swelling.  Risk Factors: None identified. Limitations: Body habitus, poor ultrasound/tissue interface and patient positioning, patient movement. Comparison Study: No prior studies. Performing Technologist: Oliver Hum RVT  Examination Guidelines: A complete evaluation includes B-mode imaging, spectral Doppler, color Doppler, and power Doppler as needed of all accessible portions of each vessel. Bilateral testing is considered an integral part of a complete examination. Limited examinations for reoccurring  indications may be performed as noted. The reflux portion of the exam is performed with the patient in reverse Trendelenburg.  +---------+---------------+---------+-----------+----------+--------------+ RIGHT    CompressibilityPhasicitySpontaneityPropertiesThrombus Aging +---------+---------------+---------+-----------+----------+--------------+ CFV      Full           Yes      Yes                                 +---------+---------------+---------+-----------+----------+--------------+ SFJ      Full                                                        +---------+---------------+---------+-----------+----------+--------------+ FV Prox  Full                                                        +---------+---------------+---------+-----------+----------+--------------+ FV Mid   Full                                                        +---------+---------------+---------+-----------+----------+--------------+ FV DistalFull                                                        +---------+---------------+---------+-----------+----------+--------------+ PFV      Full                                                        +---------+---------------+---------+-----------+----------+--------------+ POP      Full           Yes      Yes                                 +---------+---------------+---------+-----------+----------+--------------+ PTV      Full                                                        +---------+---------------+---------+-----------+----------+--------------+ PERO     Full                                                        +---------+---------------+---------+-----------+----------+--------------+   +---------+---------------+---------+-----------+----------+--------------+  LEFT     CompressibilityPhasicitySpontaneityPropertiesThrombus Aging  +---------+---------------+---------+-----------+----------+--------------+ CFV      Full           Yes      Yes                                 +---------+---------------+---------+-----------+----------+--------------+ SFJ      Full                                                        +---------+---------------+---------+-----------+----------+--------------+ FV Prox  Full                                                        +---------+---------------+---------+-----------+----------+--------------+ FV Mid   Full                                                        +---------+---------------+---------+-----------+----------+--------------+ FV DistalFull                                                        +---------+---------------+---------+-----------+----------+--------------+ PFV      Full                                                        +---------+---------------+---------+-----------+----------+--------------+ POP      Full           Yes      Yes                                 +---------+---------------+---------+-----------+----------+--------------+ PTV      Full                                                        +---------+---------------+---------+-----------+----------+--------------+ PERO     Full                                                        +---------+---------------+---------+-----------+----------+--------------+     Summary: RIGHT: - There is no evidence of deep vein thrombosis in the lower extremity.  - No cystic structure found in the popliteal fossa.  LEFT: - There is no evidence of deep vein thrombosis in the lower extremity.  - No  cystic structure found in the popliteal fossa.  *See table(s) above for measurements and observations. Electronically signed by Servando Snare MD on 05/12/2020 at 4:58:25 PM.    Final    ECHOCARDIOGRAM LIMITED  Result Date: 05/12/2020    ECHOCARDIOGRAM LIMITED REPORT    Patient Name:   Amy Riddle Date of Exam: 05/12/2020 Medical Rec #:  [id number]            Height:       62.5 in Accession #:    [id number]           Weight:       278.2 lb Date of Birth:  1946/03/16            BSA:          2.213 m Patient Age:    63 years             BP:           104/78 mmHg Patient Gender: F                    HR:           110 bpm. Exam Location:  Inpatient Procedure: Limited Color Doppler, Limited Echo and Cardiac Doppler Indications:    acute respiratory insufficiency 518.82  History:        Patient has prior history of Echocardiogram examinations, most                 recent 05/11/2020. CHF, Arrythmias:Atrial Fibrillation,                 Signs/Symptoms:Shortness of Breath; Risk Factors:Diabetes.  Sonographer:    Johny Chess Referring Phys: [id number] [place]  1. Left ventricular ejection fraction, by estimation, is 40 to 45%. The left ventricle has mildly decreased function. The left ventricle demonstrates global hypokinesis. There is mild concentric left ventricular hypertrophy. Left ventricular diastolic function could not be evaluated. There is the interventricular septum is flattened in diastole ('D' shaped left ventricle), consistent with right ventricular volume overload.  2. Right ventricular systolic function is mildly reduced. The right ventricular size is moderately enlarged. There is moderately elevated pulmonary artery systolic pressure.  3. Right atrial size was moderately dilated.  4. The aortic valve is tricuspid.  5. The inferior vena cava is dilated in size with <50% respiratory variability, suggesting right atrial pressure of 15 mmHg. FINDINGS  Left Ventricle: Left ventricular ejection fraction, by estimation, is 40 to 45%. The left ventricle has mildly decreased function. The left ventricle demonstrates global hypokinesis. There is mild concentric left ventricular hypertrophy. The interventricular septum is flattened in diastole ('D' shaped left  ventricle), consistent with right ventricular volume overload. Right Ventricle: The right ventricular size is moderately enlarged. Right ventricular systolic function is mildly reduced. There is moderately elevated pulmonary artery systolic pressure. The tricuspid regurgitant velocity is 3.05 m/s, and with an assumed right atrial pressure of 15 mmHg, the estimated right ventricular systolic pressure is 02.6 mmHg. Right Atrium: Right atrial size was moderately dilated. Pericardium: Trivial pericardial effusion is present. Tricuspid Valve: Tricuspid valve regurgitation is trivial. Aortic Valve: The aortic valve is tricuspid. Venous: The inferior vena cava is dilated in size with less than 50% respiratory variability, suggesting right atrial pressure of 15 mmHg.  LEFT VENTRICLE PLAX 2D LVIDd:         5.10 cm LVIDs:         4.00 cm LV PW:  1.10 cm LV IVS:        1.10 cm LVOT diam:     2.30 cm LVOT Area:     4.15 cm  IVC IVC diam: 3.20 cm LEFT ATRIUM         Index      RIGHT ATRIUM           Index LA diam:    3.80 cm 1.72 cm/m RA Area:     23.80 cm                                RA Volume:   73.10 ml  33.04 ml/m   AORTA Ao Root diam: 3.20 cm TRICUSPID VALVE TR Peak grad:   37.2 mmHg TR Vmax:        305.00 cm/s  SHUNTS Systemic Diam: 2.30 cm Skeet Latch MD Electronically signed by Skeet Latch MD Signature Date/Time: 05/12/2020/1:38:53 PM    Final    US Abdomen Limited RUQ  Result Date: 05/13/2020 CLINICAL DATA:  Cirrhosis EXAM: ULTRASOUND ABDOMEN LIMITED RIGHT UPPER QUADRANT COMPARISON:  07/05/2005 abdominal CT FINDINGS: Gallbladder: Partial distension of the gallbladder. No gallstones or wall thickening visualized. No sonographic Murphy sign noted by sonographer. Common bile duct: Diameter: 4 mm Liver: Lobulated liver surface and large central fissures, correlating with history of cirrhosis. No evidence of mass lesion. Portal vein is patent on color Doppler imaging with normal direction of blood  flow towards the liver. Other: Right pleural effusion that is known from chest x-ray yesterday. IMPRESSION: 1. Cirrhosis without ascites or visible mass. 2. Right pleural effusion. Electronically Signed   By: Monte Fantasia M.D.   On: 05/13/2020 11:38    (Echo, Carotid, EGD, Colonoscopy, ERCP)    Subjective:  Patient resting in bed no acute distress anxious to go home Tolerated BiPAP overnight  discharge Exam: Vitals:   05/20/20 0605 05/20/20 0803  BP: 110/76 105/65  Pulse: 77 96  Resp: 18 20  Temp: 97.6 F (36.4 C) 97.7 F (36.5 C)  SpO2: (!) 85% 96%   Vitals:   05/20/20 0142 05/20/20 0605 05/20/20 0644 05/20/20 0803  BP: (!) 125/91 110/76  105/65  Pulse: 76 77  96  Resp: 17 18  20   Temp: (!) 97.5 F (36.4 C) 97.6 F (36.4 C)  97.7 F (36.5 C)  TempSrc: Oral Oral  Oral  SpO2: 97% (!) 85%  96%  Weight:   123.5 kg   Height:        General: Pt is alert, awake, not in acute distress Cardiovascular: RRR, S1/S2 +, no rubs, no gallops Respiratory: CTA bilaterally, no wheezing, no rhonchi Abdominal: Soft, NT, ND, bowel sounds + Extremities: Trace edema  The results of significant diagnostics from this hospitalization (including imaging, microbiology, ancillary and laboratory) are listed below for reference.     Microbiology: Recent Results (from the past 240 hour(s))  SARS Coronavirus 2 by RT PCR (hospital order, performed in [facility] hospital lab) Nasopharyngeal Nasopharyngeal Swab     Status: None   Collection Time: 05/10/20  8:31 PM   Specimen: Nasopharyngeal Swab  Result Value Ref Range Status   SARS Coronavirus 2 NEGATIVE NEGATIVE Final    Comment: (NOTE) SARS-CoV-2 target nucleic acids are NOT DETECTED. The SARS-CoV-2 RNA is generally detectable in upper and lower respiratory specimens during the acute phase of infection. The lowest concentration of SARS-CoV-2 viral copies this assay can detect is 250 copies /  mL. A negative result does not preclude  SARS-CoV-2 infection and should not be used as the sole basis for treatment or other patient management decisions.  A negative result may occur with improper specimen collection / handling, submission of specimen other than nasopharyngeal swab, presence of viral mutation(s) within the areas targeted by this assay, and inadequate number of viral copies (<250 copies / mL). A negative result must be combined with clinical observations, patient history, and epidemiological information. Fact Sheet for Patients:   StrictlyIdeas.no Fact Sheet for Healthcare Providers: BankingDealers.co.za This test is not yet approved or cleared  by the [place] FDA and has been authorized for detection and/or diagnosis of SARS-CoV-2 by FDA under an Emergency Use Authorization (EUA).  This EUA will remain in effect (meaning this test can be used) for the duration of the COVID-19 declaration under Section 564(b)(1) of the Act, 21 U.S.C. section 360bbb-3(b)(1), unless the authorization is terminated or revoked sooner. Performed at [facility], [place] [address], [place], [place] [address]   Culture, Urine     Status: Abnormal   Collection Time: 05/11/20  1:58 AM   Specimen: Urine, Catheterized  Result Value Ref Range Status   Specimen Description URINE, CATHETERIZED  Final   Special Requests ADDED  Final   Culture (A)  Final    >=100,000 COLONIES/mL GROUP B STREP(S.AGALACTIAE)ISOLATED TESTING AGAINST S. AGALACTIAE NOT ROUTINELY PERFORMED DUE TO PREDICTABILITY OF AMP/PEN/VAN SUSCEPTIBILITY. Performed at [facility], [place] [address], [place], [place] [address]    Report Status 05/13/2020 FINAL  Final  Culture, blood (Routine X 2) w Reflex to ID Panel     Status: None   Collection Time: 05/11/20  3:20 PM   Specimen: BLOOD  Result Value Ref Range Status   Specimen Description BLOOD RIGHT ANTECUBITAL  Final   Special Requests   Final    BOTTLES  DRAWN AEROBIC AND ANAEROBIC Blood Culture adequate volume   Culture   Final    NO GROWTH 5 DAYS Performed at [facility], [place] [address], [place], [place] [address]    Report Status 05/16/2020 FINAL  Final  Culture, blood (Routine X 2) w Reflex to ID Panel     Status: None   Collection Time: 05/11/20  3:20 PM   Specimen: BLOOD RIGHT HAND  Result Value Ref Range Status   Specimen Description BLOOD RIGHT HAND  Final   Special Requests   Final    BOTTLES DRAWN AEROBIC ONLY Blood Culture adequate volume   Culture   Final    NO GROWTH 5 DAYS Performed at [facility], [place] [address], [place], [place] [address]    Report Status 05/16/2020 FINAL  Final     Labs: BNP (last 3 results) Recent Labs    05/15/20 0715 05/16/20 1100 05/18/20 0443  BNP 924.5* 938.2* 025.8*   Basic Metabolic Panel: Recent Labs  Lab 05/14/20 0451 05/15/20 0715 05/16/20 0739 05/17/20 0611 05/18/20 0443  NA 144 145 146* 145 143  K 5.3* 5.6* 5.0 4.4 4.2  CL 99 101 98 96* 96*  CO2 36* 37* 41* 42* 35*  GLUCOSE 161* 147* 156* 148* 137*  BUN 81* 64* 50* 43* 46*  CREATININE 1.41* 1.18* 1.13* 1.20* 1.17*  CALCIUM 9.9 9.7 9.6 9.1 9.0  MG 2.8* 2.7* 2.3  --   --   PHOS 2.9  --   --   --   --    Liver Function Tests: No results  for input(s): AST, ALT, ALKPHOS, BILITOT, PROT, ALBUMIN in the last 168 hours. No results for input(s): LIPASE, AMYLASE in the last 168 hours. No results for input(s): AMMONIA in the last 168 hours. CBC: No results for input(s): WBC, NEUTROABS, HGB, HCT, MCV, PLT in the last 168 hours. Cardiac Enzymes: No results for input(s): CKTOTAL, CKMB, CKMBINDEX, TROPONINI in the last 168 hours. BNP: Invalid input(s): POCBNP CBG: Recent Labs  Lab 05/19/20 0621 05/19/20 1118 05/19/20 1637 05/19/20 2025 05/20/20 0608  GLUCAP 122* 156* 108* 184* 130*   D-Dimer No results for input(s): DDIMER in the last 72 hours. Hgb A1c No results for input(s): HGBA1C in the last 72  hours. Lipid Profile No results for input(s): CHOL, HDL, LDLCALC, TRIG, CHOLHDL, LDLDIRECT in the last 72 hours. Thyroid function studies No results for input(s): TSH, T4TOTAL, T3FREE, THYROIDAB in the last 72 hours.  Invalid input(s): FREET3 Anemia work up No results for input(s): VITAMINB12, FOLATE, FERRITIN, TIBC, IRON, RETICCTPCT in the last 72 hours. Urinalysis    Component Value Date/Time   COLORURINE YELLOW 05/11/2020 0158   APPEARANCEUR CLEAR 05/11/2020 0158   LABSPEC 1.012 05/11/2020 0158   PHURINE 5.0 05/11/2020 0158   GLUCOSEU NEGATIVE 05/11/2020 0158   HGBUR MODERATE (A) 05/11/2020 0158   BILIRUBINUR NEGATIVE 05/11/2020 0158   KETONESUR NEGATIVE 05/11/2020 0158   PROTEINUR 30 (A) 05/11/2020 0158   NITRITE NEGATIVE 05/11/2020 0158   LEUKOCYTESUR LARGE (A) 05/11/2020 0158   Sepsis Labs Invalid input(s): PROCALCITONIN,  WBC,  LACTICIDVEN Microbiology Recent Results (from the past 240 hour(s))  SARS Coronavirus 2 by RT PCR (hospital order, performed in [place] hospital lab) Nasopharyngeal Nasopharyngeal Swab     Status: None   Collection Time: 05/10/20  8:31 PM   Specimen: Nasopharyngeal Swab  Result Value Ref Range Status   SARS Coronavirus 2 NEGATIVE NEGATIVE Final    Comment: (NOTE) SARS-CoV-2 target nucleic acids are NOT DETECTED. The SARS-CoV-2 RNA is generally detectable in upper and lower respiratory specimens during the acute phase of infection. The lowest concentration of SARS-CoV-2 viral copies this assay can detect is 250 copies / mL. A negative result does not preclude SARS-CoV-2 infection and should not be used as the sole basis for treatment or other patient management decisions.  A negative result may occur with improper specimen collection / handling, submission of specimen other than nasopharyngeal swab, presence of viral mutation(s) within the areas targeted by this assay, and inadequate number of viral copies (<250 copies / mL). A negative  result must be combined with clinical observations, patient history, and epidemiological information. Fact Sheet for Patients:   StrictlyIdeas.no Fact Sheet for Healthcare Providers: BankingDealers.co.za This test is not yet approved or cleared  by the [place] FDA and has been authorized for detection and/or diagnosis of SARS-CoV-2 by FDA under an Emergency Use Authorization (EUA).  This EUA will remain in effect (meaning this test can be used) for the duration of the COVID-19 declaration under Section 564(b)(1) of the Act, 21 U.S.C. section 360bbb-3(b)(1), unless the authorization is terminated or revoked sooner. Performed at [facility], [place] [address] [place], [place] [address]   Culture, Urine     Status: Abnormal   Collection Time: 05/11/20  1:58 AM   Specimen: Urine, Catheterized  Result Value Ref Range Status   Specimen Description URINE, CATHETERIZED  Final   Special Requests ADDED  Final   Culture (A)  Final    >=100,000 COLONIES/mL GROUP B STREP(S.AGALACTIAE)ISOLATED TESTING AGAINST S.  AGALACTIAE NOT ROUTINELY PERFORMED DUE TO PREDICTABILITY OF AMP/PEN/VAN SUSCEPTIBILITY. Performed at [facility], [place] [address] [place], [place] [address]    Report Status 05/13/2020 FINAL  Final  Culture, blood (Routine X 2) w Reflex to ID Panel     Status: None   Collection Time: 05/11/20  3:20 PM   Specimen: BLOOD  Result Value Ref Range Status   Specimen Description BLOOD RIGHT ANTECUBITAL  Final   Special Requests   Final    BOTTLES DRAWN AEROBIC AND ANAEROBIC Blood Culture adequate volume   Culture   Final    NO GROWTH 5 DAYS Performed at [facility], [place] [address], [place], [place] [address]    Report Status 05/16/2020 FINAL  Final  Culture, blood (Routine X 2) w Reflex to ID Panel     Status: None   Collection Time: 05/11/20  3:20 PM   Specimen: BLOOD RIGHT HAND  Result Value Ref Range Status    Specimen Description BLOOD RIGHT HAND  Final   Special Requests   Final    BOTTLES DRAWN AEROBIC ONLY Blood Culture adequate volume   Culture   Final    NO GROWTH 5 DAYS Performed at [facility], [place] [address] [place], [place] [address]    Report Status 05/16/2020 FINAL  Final     Time coordinating discharge:  38 minutes  SIGNED:   Georgette Shell, MD  [facility] 05/20/2020, 10:14 AM Pager   If 7PM-7AM, please contact night-coverage [email or web address] Password [email or web address]

## 2020-05-20 NOTE — Progress Notes (Signed)
Physical Therapy Treatment Patient Details Name: Amy Riddle MRN: [id number] DOB: Sep 07, 1946 Today's Date: 05/20/2020    History of Present Illness 74 yo female presented with worsening dyspnea for 2 weeks with productive cough, wheezing, leg edema with concern for asthma and CHF exacerbation.    PT Comments    Pt admitted with above diagnosis. Pt was able to ambulate with RW with min guard to min assist and incr distance needing cues and guard assist for safety.  Progressed distance. Pt adamant that she is going home therefore will need HHPT and HHOT.  Pt aware that PT recommends SNF. Pt currently with functional limitations due to balance and endurance deficits. Pt will benefit from skilled PT to increase their independence and safety with mobility to allow discharge to the venue listed below.    Follow Up Recommendations  Home health PT;Supervision/Assistance - 24 hour (Pt refusing SNF therefore REcommend HHPT and HHOT)     Equipment Recommendations  None recommended by PT , geomat cushion recommended, issued gait belt   Recommendations for Other Services       Precautions / Restrictions Precautions Precautions: Fall Restrictions Weight Bearing Restrictions: No    Mobility  Bed Mobility Overal bed mobility: Needs Assistance             General bed mobility comments: Pt was sitting on EOB on arrival.   Transfers Overall transfer level: Needs assistance Equipment used: Rolling walker (2 wheeled) Transfers: Sit to/from Stand Sit to Stand: Min guard;From elevated surface         General transfer comment: for safety, increased time to rise and steady. VC for hand placement when rising not followed.   Ambulation/Gait Ambulation/Gait assistance: Min guard;Min assist Gait Distance (Feet): 145 Feet Assistive device: Rolling walker (2 wheeled) Gait Pattern/deviations: Step-through pattern;Decreased stride length;Trunk flexed Gait velocity: decr Gait velocity  interpretation: <1.31 ft/sec, indicative of household ambulator General Gait Details: min assist to min guard assist to ambulate wtih cues for safety with RW as well as for proximity to RW. Foollowed pt with chair however did not need to sit but was able to turn around and walk back to room.    Stairs             Wheelchair Mobility    Modified Rankin (Stroke Patients Only)       Balance Overall balance assessment: Needs assistance Sitting-balance support: No upper extremity supported;Feet supported Sitting balance-Leahy Scale: Fair     Standing balance support: Bilateral upper extremity supported;During functional activity Standing balance-Leahy Scale: Poor Standing balance comment: relies on UE support for balance.                             Cognition Arousal/Alertness: Awake/alert Behavior During Therapy: WFL for tasks assessed/performed Overall Cognitive Status: Impaired/Different from baseline Area of Impairment: Safety/judgement;Problem solving                 Orientation Level: Place;Time;Situation   Memory: Decreased short-term memory Following Commands: Follows one step commands inconsistently;Follows one step commands with increased time Safety/Judgement: Decreased awareness of deficits;Decreased awareness of safety   Problem Solving: Requires verbal cues;Requires tactile cues General Comments: Confusion appears better, A&O but still lacks insight into deficits and how mobility issues with make d/c home difficult.      Exercises General Exercises - Lower Extremity Ankle Circles/Pumps: AROM;Both;10 reps;Seated Long Arc Quad: AROM;Both;10 reps;Seated Other Exercises Other Exercises: breathing technique to recover SpO2  General Comments General comments (skin integrity, edema, etc.): Pt continues to need 4LO2 with activity      Pertinent Vitals/Pain Pain Assessment: No/denies pain    Home Living                       Prior Function            PT Goals (current goals can now be found in the care plan section) Acute Rehab PT Goals Patient Stated Goal: to go home Progress towards PT goals: Progressing toward goals    Frequency    Min 3X/week      PT Plan Discharge plan needs to be updated    Co-evaluation              AM-PAC PT "6 Clicks" Mobility   Outcome Measure  Help needed turning from your back to your side while in a flat bed without using bedrails?: A Little Help needed moving from lying on your back to sitting on the side of a flat bed without using bedrails?: A Little Help needed moving to and from a bed to a chair (including a wheelchair)?: A Little Help needed standing up from a chair using your arms (e.g., wheelchair or bedside chair)?: A Little Help needed to walk in hospital room?: A Little Help needed climbing 3-5 steps with a railing? : A Lot 6 Click Score: 17    End of Session Equipment Utilized During Treatment: Gait belt;Oxygen Activity Tolerance: Patient limited by fatigue Patient left: with call bell/phone within reach;in chair;with chair alarm set Nurse Communication: Mobility status PT Visit Diagnosis: Muscle weakness (generalized) (M62.81);Other abnormalities of gait and mobility (R26.89)     Time: 5361-4431 PT Time Calculation (min) (ACUTE ONLY): 23 min  Charges:  $Gait Training: 8-22 mins $Therapeutic Exercise: 8-22 mins                     Amy Riddle,PT Acute Rehabilitation Services Pager:  [phone]  Office:  [place] 05/20/2020, 1:02 PM

## 2020-05-20 NOTE — Plan of Care (Signed)
  Problem: Education: Goal: Knowledge of General Education information will improve Description: Including pain rating scale, medication(s)/side effects and non-pharmacologic comfort measures Outcome: Adequate for Discharge   Problem: Health Behavior/Discharge Planning: Goal: Ability to manage health-related needs will improve Outcome: Adequate for Discharge   Problem: Clinical Measurements: Goal: Ability to maintain clinical measurements within normal limits will improve Outcome: Adequate for Discharge Goal: Will remain free from infection Outcome: Adequate for Discharge Goal: Diagnostic test results will improve Outcome: Adequate for Discharge Goal: Respiratory complications will improve Outcome: Adequate for Discharge Goal: Cardiovascular complication will be avoided Outcome: Adequate for Discharge   Problem: Activity: Goal: Risk for activity intolerance will decrease Outcome: Adequate for Discharge   Problem: Nutrition: Goal: Adequate nutrition will be maintained Outcome: Adequate for Discharge   Problem: Coping: Goal: Level of anxiety will decrease Outcome: Adequate for Discharge   Problem: Elimination: Goal: Will not experience complications related to bowel motility Outcome: Adequate for Discharge Goal: Will not experience complications related to urinary retention Outcome: Adequate for Discharge   Problem: Pain Managment: Goal: General experience of comfort will improve Outcome: Adequate for Discharge   Problem: Safety: Goal: Ability to remain free from injury will improve Outcome: Adequate for Discharge   Problem: Skin Integrity: Goal: Risk for impaired skin integrity will decrease Outcome: Adequate for Discharge   Problem: Education: Goal: Ability to demonstrate management of disease process will improve Outcome: Adequate for Discharge Goal: Ability to verbalize understanding of medication therapies will improve Outcome: Adequate for Discharge    Problem: Activity: Goal: Capacity to carry out activities will improve Outcome: Adequate for Discharge   Problem: Cardiac: Goal: Ability to achieve and maintain adequate cardiopulmonary perfusion will improve Outcome: Adequate for Discharge

## 2020-05-24 ENCOUNTER — Telehealth: Payer: Self-pay

## 2020-05-24 NOTE — Telephone Encounter (Signed)
Phone call placed to patient's husband to introduce Palliative care and to offer to schedule visit with NP. Verbal consent obtained. Visit scheduled for 06/02/20 @ 12noon.

## 2020-05-27 ENCOUNTER — Other Ambulatory Visit: Payer: 59 | Admitting: Internal Medicine

## 2020-05-27 ENCOUNTER — Other Ambulatory Visit: Payer: Self-pay

## 2020-05-27 DIAGNOSIS — Z515 Encounter for palliative care: Secondary | ICD-10-CM

## 2020-05-27 DIAGNOSIS — Z7189 Other specified counseling: Secondary | ICD-10-CM

## 2020-05-27 NOTE — Progress Notes (Signed)
June 25th, 2021 [facility] Palliative Care Consult Note Telephone: [phone]  Fax: [phone]  PATIENT NAME: Amy Riddle DOB: 1945-12-12 MRN: [id number]  [place] [id number]  PRIMARY CARE PROVIDER:   Leonard Downing, MD  REFERRING PROVIDER:  Leonard Downing, MD [place],  Amy Riddle [address]  RESPONSIBLE PARTY:  Amy Riddle, Amy Riddle (Spouse)  [phone] [facility] Phone)   [place] [phone]  ASSESSMENT / RECOMMENDATIONS:  1. Advance Care Planning: A. Directives: Discussed. She was DNR previous hospitalization. I provided an extra coupy of the DNR form to patient. They have it posted on their entrance door.   B. Goals of Care: Patient is hoping to obtain an order for a portable oxygen compressor tank.so can more easily travel outside the home and increase her activity level. Shes hoping to be able to go up and down the stairs as previously, and to get back to cooking.  2. Cognitive / Functional status: Patient is independent in her ADLs, including bathing, hygiene, dressing, and eating. Currently stays upstairs as there are no bathrooms downstairs, and she wont hear of using a commode for occasional downstairs use. She can ambulate up and down the stairs but slowly. PT is working with her. We reviewed some low impact exercises and I left a copy of these with her.  She denies dyspnea. Maintains oxygen at 3 LPM, with sats of 96%. She wears nocturnal CPAP; only able to tolerate for about 4 hours/night d/t feeling claustrophobic.   Good appetite; improved since hospital discharge. Current weight is 123.5kg. At a height of 5'2.5" her BMI is 49kg/m2. She has diet controlled DM; last CBG yeasteday was 106. She checks her CBGs about QOD. [name]  Occasional anxiety for which she will take a rare prn Ativan. She avoids b/c it makes her feel loopy. I suggested that she break the tab in half.  4.  Family Supports: Home Helath, PT/OT/aide/SW. Pt refused SNF post hospitalization. Amy Riddle. Married for 44 yrs. Her spouse quit his job so that he could be available to assist patient at home. He also is suffering from some medical issues. One adopted daughter now age 84 is supportive.  5. Follow up Palliative Care Visit: patient will call us on a prn basis. Feels she has a Adequate support at this time. She has my contact information.  I spent 60 minutes providing this consultation from 11am-noon. More than 50% of the time in this consultation was spent coordinating communication.   HISTORY OF PRESENT ILLNESS:  Amy Riddle is a 74 y.o.  female with medical history significant for asthma with excerbations, HTN, permanent atrial fibrillation (Eliquis/Cardizem), Type 2 DM (oral meds and SSI), morbid obesity, AKI, cirrhosis, chronic respiratory failure with hypoxia and hypercapnia.   6/8-6/18/2021 hospitalized with acute on chronic hypoxic hypercapnic respiratory failure 2/2 CHF/asthma exacerbation/morbid obesity/sleep apnea/obesity hypoventilation  Echo: EF 40-49%. F/u pulm OP sleep study  Palliative Care was asked to help address goals of care.   CODE STATUS: DNR  PPS: 70%  HOSPICE ELIGIBILITY/DIAGNOSIS: TBD  PAST MEDICAL HISTORY:  Past Medical History:  Diagnosis Date   Arthritis    Asthma    Diabetes mellitus without complication (HCC)    TYPE II no meds   Eczema    Fibroid    Hypertension    Osteoarthritis    Ovarian neoplasm    PRODUCING TESTOSTERONE   Submucous myoma of uterus  SOCIAL HX:  Social History   Tobacco Use   Smoking status: Former Smoker    Quit date: 03/26/1974    Years since quitting: 46.2   Smokeless tobacco: Never Used  Substance Use Topics   Alcohol use: No    ALLERGIES:  Allergies  Allergen Reactions   Tylenol Arthritis Ext [Acetaminophen] Other (See Comments)    Does not help     PERTINENT MEDICATIONS:  Outpatient  Encounter Medications as of 05/27/2020  Medication Sig   acetaminophen (TYLENOL) 500 MG tablet Take 1,000 mg by mouth every 8 (eight) hours as needed for mild pain or moderate pain.   ADVAIR DISKUS 500-50 MCG/DOSE AEPB Inhale 1 puff into the lungs 2 (two) times daily.   albuterol (VENTOLIN HFA) 108 (90 Base) MCG/ACT inhaler Inhale 1-2 puffs into the lungs 2 (two) times daily as needed for shortness of breath.    Clobetasol Prop Emollient Base 0.05 % emollient cream Apply 1 application topically daily as needed (eczema).    diclofenac sodium (VOLTAREN) 1 % GEL Apply 2 g topically 2 (two) times daily.   diltiazem (CARDIZEM CD) 360 MG 24 hr capsule Take 1 capsule (360 mg total) by mouth daily.   ELIQUIS 5 MG TABS tablet Take 1 tablet (5 mg total) by mouth 2 (two) times daily.   furosemide (LASIX) 40 MG tablet Take 40 mg by mouth 2 (two) times daily.    levalbuterol (XOPENEX) 1.25 MG/0.5ML nebulizer solution Take 0.63 mg by nebulization every 6 (six) hours as needed for wheezing or shortness of breath.   loratadine (CLARITIN) 10 MG tablet Take 10 mg by mouth daily as needed for allergies.   LORazepam (ATIVAN) 0.5 MG tablet Take 0.5 tablets (0.25 mg total) by mouth 2 (two) times daily as needed for anxiety or sleep.   metFORMIN (GLUCOPHAGE) 1000 MG tablet Take 500-1,000 mg by mouth See admin instructions. Take 1000 mg in the morning and 500 mg in the evening   nystatin cream (MYCOSTATIN) Apply 1 application topically daily as needed (yeast under breast).    No facility-administered encounter medications on file as of 05/27/2020.    Amy Handler, NP  [place]

## 2020-05-29 ENCOUNTER — Encounter: Payer: Self-pay | Admitting: Internal Medicine

## 2020-05-30 ENCOUNTER — Other Ambulatory Visit: Payer: Self-pay | Admitting: Cardiology

## 2020-05-30 NOTE — Telephone Encounter (Signed)
Prescription refill request for Eliquis received. Indication: Atrial Fibrillation Last office visit: Amy Riddle 01/18/20 Scr: 1.17 05/18/20 Age: 74 Weight: 123.5 kg Prescription Refilled

## 2020-06-01 ENCOUNTER — Telehealth: Payer: Self-pay | Admitting: Adult Health

## 2020-06-01 MED ORDER — DILTIAZEM HCL ER COATED BEADS 240 MG PO CP24: 240.0000 mg | ORAL_CAPSULE | Freq: Every day | ORAL | 3 refills | Status: DC

## 2020-06-01 MED ORDER — METOPROLOL TARTRATE 50 MG PO TABS
50.0000 mg | ORAL_TABLET | Freq: Two times a day (BID) | ORAL | 3 refills | Status: DC
Start: 2020-06-01 — End: 2020-06-13

## 2020-06-01 NOTE — Telephone Encounter (Signed)
Spoke with pt who states she was recently discharged from hospital for acute CHF and respiratory failure.  Pt is currently receiving home health services, is on O2 - 3L q24hr.  Pt reports continued bilateral lower extremity edema with Furosemide 80mg  daily.  Pt continues to have to sleep in recliner and is following Na+ restricted diet.  Pt asking if there is anything else that can be done to help with swelling.  Pt has had f/u visit with her PCP and appt is scheduled for 07/25/2020 with Dr Percival Spanish.  Pt would like to make Wynona Luna aware of hospitalization and requests thoughts on continued swelling.  Pt advised will forward information to NP for review and any additional recommendations.  Pt verbalizes understanding and agrees with current plan.

## 2020-06-01 NOTE — Telephone Encounter (Signed)
Pt updated with recommendations and verbalized understanding. Pt state she would like to try medication change first to see if it works before scheduling an appointment.

## 2020-06-01 NOTE — Telephone Encounter (Signed)
New message   Patient want to know if there ia any medication that will slow down her heart without causing swelling. Please advise.

## 2020-06-01 NOTE — Telephone Encounter (Signed)
I think that we can decrease the diltiazem back to 240 mg as her heart pumping function may not allow for higher dose. However, we will need to add metoprolol tartrate 50 mg BID to make up for the decrease in diltiazem.  She can increase lasix to 60 mg in the am and 40 mg in the pm for 3 days. Do not go down on diltiazem until she has the metoprolol on board.  See if she can see Dr. Percival Spanish sooner, please.  My schedule is limited  KL

## 2020-06-02 ENCOUNTER — Other Ambulatory Visit: Payer: Self-pay | Admitting: Internal Medicine

## 2020-06-11 DIAGNOSIS — I5041 Acute combined systolic (congestive) and diastolic (congestive) heart failure: Secondary | ICD-10-CM | POA: Insufficient documentation

## 2020-06-11 NOTE — Progress Notes (Signed)
Cardiology Office Note   Date:  06/13/2020   ID:  Amy, Riddle 1946-11-05, MRN [id number]  PCP:  Leonard Downing, MD  Cardiologist:   Minus Breeding, MD    Chief Complaint  Patient presents with  . Acute Respiratory Failure      History of Present Illness: Amy Riddle is a 74 y.o. female who presents for follow up of SOB.  She was in the hospital in June with respiratory distress.  I reviewed these records for this visit.     She was treated for multifactorial  Respiratory failure.  She had sleep apnea and hypoventilation obesity syndrome.  She was also thought to have acute systolic and diastolic HF.  She was treated with antibiotics.   She continued to have swelling an dyspnea at home.  We decreased her Dilt and increased her Lasix for a few days.  Metoprolol was added for rate control.  She is followed at home by palliative care.    Her weight has gone down from 268 pounds when she got home to 254.  She is watching her salt.  Her leg swelling is much improved on the increased Lasix.  She tries to wear CPAP but cannot sleep all night with it.  She is wearing oxygen 3 L.  She denies any chest pressure, neck or arm discomfort.  She had no palpitations, presyncope or syncope.  Of note her ejection fraction in the hospital was 45% which is relatively unchanged from previous.   Past Medical History:  Diagnosis Date  . Arthritis   . Asthma   . Diabetes mellitus without complication (HCC)    TYPE II no meds  . Eczema   . Fibroid   . Hypertension   . Osteoarthritis   . Ovarian neoplasm    PRODUCING TESTOSTERONE  . Submucous myoma of uterus     Past Surgical History:  Procedure Laterality Date  . CARDIOVERSION N/A 10/05/2019   Procedure: CARDIOVERSION;  Surgeon: Fay Records, MD;  Location: [place];  Service: Cardiovascular;  Laterality: N/A;  . CARPAL TUNNEL RELEASE Left   . CARPAL TUNNEL RELEASE Left 03/23/2015   Procedure: LEFT CARPAL  TUNNEL RELEASE;  Surgeon: Leandrew Koyanagi, MD;  Location: [place];  Service: Orthopedics;  Laterality: Left;  . DERMOID CYST  EXCISION    . HERNIA REPAIR     X2  . HYSTEROSCOPY     D & C  . PELVIC LAPAROSCOPY       Current Outpatient Medications  Medication Sig Dispense Refill  . acetaminophen (TYLENOL) 500 MG tablet Take 1,000 mg by mouth every 8 (eight) hours as needed for mild pain or moderate pain.    [name] ADVAIR DISKUS 500-50 MCG/DOSE AEPB Inhale 1 puff into the lungs 2 (two) times daily.    [name] albuterol (VENTOLIN HFA) 108 (90 Base) MCG/ACT inhaler Inhale 1-2 puffs into the lungs 2 (two) times daily as needed for shortness of breath.     . Clobetasol Prop Emollient Base 0.05 % emollient cream Apply 1 application topically daily as needed (eczema).     . diclofenac sodium (VOLTAREN) 1 % GEL Apply 2 g topically 2 (two) times daily.    [name] diltiazem (CARDIZEM CD) 120 MG 24 hr capsule Take 1 capsule (120 mg total) by mouth daily. 90 capsule 1  . ELIQUIS 5 MG TABS tablet TAKE 1 TABLET BY MOUTH TWICE A DAY 180 tablet 1  . furosemide (LASIX) 40  MG tablet Take 40 mg by mouth 2 (two) times daily.     [name] levalbuterol (XOPENEX) 1.25 MG/0.5ML nebulizer solution Take 0.63 mg by nebulization every 6 (six) hours as needed for wheezing or shortness of breath. 1 each 12  . loratadine (CLARITIN) 10 MG tablet Take 10 mg by mouth daily as needed for allergies.    [name] LORazepam (ATIVAN) 0.5 MG tablet Take 0.5 tablets (0.25 mg total) by mouth 2 (two) times daily as needed for anxiety or sleep. 30 tablet 0  . metFORMIN (GLUCOPHAGE) 1000 MG tablet Take 500-1,000 mg by mouth See admin instructions. Take 1000 mg in the morning and 500 mg in the evening    . metoprolol tartrate 75 MG TABS Take 75 mg by mouth 2 (two) times daily. 180 tablet 3  . nystatin cream (MYCOSTATIN) Apply 1 application topically daily as needed (yeast under breast).      No current facility-administered medications for this visit.      Allergies:   Tylenol arthritis ext [acetaminophen]    ROS:  Please see the history of present illness.   Otherwise, review of systems are positive for none.   All other systems are reviewed and negative.    PHYSICAL EXAM: VS:  BP 126/82   Pulse 62   Ht 5' 2.5" (1.588 m)   Wt 254 lb 9.6 oz (115.5 kg)   SpO2 100%   BMI 45.82 kg/m  , BMI Body mass index is 45.82 kg/m. GENERAL:  Well appearing NECK:  No jugular venous distention, waveform within normal limits, carotid upstroke brisk and symmetric, no bruits, no thyromegaly LUNGS:  Clear to auscultation bilaterally CHEST:  Unremarkable HEART:  PMI not displaced or sustained,S1 and S2 within normal limits, no S3, no clicks, no rubs, no murmurs, irregular  ABD:  Flat, positive bowel sounds normal in frequency in pitch, no bruits, no rebound, no guarding, no midline pulsatile mass, no hepatomegaly, no splenomegaly EXT:  2 plus pulses throughout, mild edema, no cyanosis no clubbing  EKG:  EKG is not ordered today. The ekg ordered today demonstrates NA   Recent Labs: 05/10/2020: ALT 36 05/12/2020: Hemoglobin 15.2; Platelets 249 05/16/2020: Magnesium 2.3 05/18/2020: B Natriuretic Peptide 451.5; BUN 46; Creatinine, Ser 1.17; Potassium 4.2; Sodium 143    Lipid Panel No results found for: CHOL, TRIG, HDL, CHOLHDL, VLDL, LDLCALC, LDLDIRECT    Wt Readings from Last 3 Encounters:  06/13/20 254 lb 9.6 oz (115.5 kg)  05/20/20 272 lb 4.3 oz (123.5 kg)  01/18/20 276 lb 6.4 oz (125.4 kg)      Other studies Reviewed: Additional studies/ records that were reviewed today include: Hospital records. Review of the above records demonstrates:  Please see elsewhere in the note.     ASSESSMENT AND PLAN:  ACUTE SYSTOLIC AND DIASTOLIC HF:   Today I am going to keep her on the current dose of diuretic.  I will check a basic metabolic profile.  I am going to start to titrate down her Cardizem and increase her beta-blocker.  She has not tolerated  ACE or ARB in the past and I might switch her to hydralazine nitrates in the future.  I would do this after titrating the beta-blocker up and Cardizem down further.  CHRONIC ATRIAL FIB: She tolerates anticoagulation.  She has had reasonable rate control.  AKI: I will check this as above.  COVID EDUCATION:  She has been vaccinated.  Current medicines are reviewed at length with the patient today.  The patient  does not have concerns regarding medicines.  The following changes have been made:  no change  Labs/ tests ordered today include:   Orders Placed This Encounter  Procedures  . Basic metabolic panel     Disposition:   FU with APP in 3 weeks.     Signed, Minus Breeding, MD  06/13/2020 9:45 AM    [place]

## 2020-06-13 ENCOUNTER — Other Ambulatory Visit: Payer: Self-pay

## 2020-06-13 ENCOUNTER — Encounter: Payer: Self-pay | Admitting: Cardiology

## 2020-06-13 ENCOUNTER — Ambulatory Visit: Payer: 59 | Admitting: Cardiology

## 2020-06-13 VITALS — BP 126/82 | HR 62 | Ht 62.5 in | Wt 254.6 lb

## 2020-06-13 DIAGNOSIS — I482 Chronic atrial fibrillation, unspecified: Secondary | ICD-10-CM | POA: Diagnosis not present

## 2020-06-13 DIAGNOSIS — J9621 Acute and chronic respiratory failure with hypoxia: Secondary | ICD-10-CM

## 2020-06-13 DIAGNOSIS — N179 Acute kidney failure, unspecified: Secondary | ICD-10-CM | POA: Diagnosis not present

## 2020-06-13 DIAGNOSIS — I5041 Acute combined systolic (congestive) and diastolic (congestive) heart failure: Secondary | ICD-10-CM | POA: Diagnosis not present

## 2020-06-13 LAB — BASIC METABOLIC PANEL
BUN/Creatinine Ratio: 17 (ref 12–28)
BUN: 17 mg/dL (ref 8–27)
CO2: 28 mmol/L (ref 20–29)
Calcium: 9.5 mg/dL (ref 8.7–10.3)
Chloride: 99 mmol/L (ref 96–106)
Creatinine, Ser: 1 mg/dL (ref 0.57–1.00)
GFR calc Af Amer: 65 mL/min/{1.73_m2} (ref >59)
GFR calc non Af Amer: 57 mL/min/{1.73_m2} — ABNORMAL LOW (ref >59)
Glucose: 107 mg/dL — ABNORMAL HIGH (ref 65–99)
Potassium: 4.8 mmol/L (ref 3.5–5.2)
Sodium: 143 mmol/L (ref 134–144)

## 2020-06-13 MED ORDER — DILTIAZEM HCL ER COATED BEADS 120 MG PO CP24: 120.0000 mg | ORAL_CAPSULE | Freq: Every day | ORAL | 1 refills | Status: DC

## 2020-06-13 MED ORDER — METOPROLOL TARTRATE 75 MG PO TABS
75.0000 mg | ORAL_TABLET | Freq: Two times a day (BID) | ORAL | 3 refills | Status: DC
Start: 2020-06-13 — End: 2020-07-08

## 2020-06-13 NOTE — Patient Instructions (Addendum)
Medication Instructions:  Decrease Diltiazem 120 mg daily  Increase Metoprolol 75 twice daily   *If you need a refill on your cardiac medications before your next appointment, please call your pharmacy*   Lab Work: BMET today   If you have labs (blood work) drawn today and your tests are completely normal, you will receive your results only by: [name] MyChart Message (if you have MyChart) OR . A paper copy in the mail If you have any lab test that is abnormal or we need to change your treatment, we will call you to review the results.   Follow-Up: At [facility], you and your health needs are our priority.  As part of our continuing mission to provide you with exceptional heart care, we have created designated Provider Care Teams.  These Care Teams include your primary Cardiologist (physician) and Advanced Practice Providers (APPs -  Physician Assistants and Nurse Practitioners) who all work together to provide you with the care you need, when you need it.  We recommend signing up for the patient portal called "MyChart".  Sign up information is provided on this After Visit Summary.  MyChart is used to connect with patients for Virtual Visits (Telemedicine).  Patients are able to view lab/test results, encounter notes, upcoming appointments, etc.  Non-urgent messages can be sent to your provider as well.   To learn more about what you can do with MyChart, go to [email or web address].    Your next appointment:   6 week(s)  The format for your next appointment:   In Person  Provider:    You may see Minus Breeding, MD or one of the following Advanced Practice Providers on your designated Care Team:    Rosaria Ferries, PA-C  Cadence Kathlen Mody, [place]

## 2020-06-15 ENCOUNTER — Encounter: Payer: Self-pay | Admitting: *Deleted

## 2020-07-08 ENCOUNTER — Other Ambulatory Visit: Payer: Self-pay

## 2020-07-08 ENCOUNTER — Ambulatory Visit: Payer: 59 | Admitting: Physician Assistant

## 2020-07-08 ENCOUNTER — Encounter: Payer: Self-pay | Admitting: Physician Assistant

## 2020-07-08 VITALS — BP 128/65 | HR 104 | Ht 62.0 in | Wt 254.8 lb

## 2020-07-08 DIAGNOSIS — I5032 Chronic diastolic (congestive) heart failure: Secondary | ICD-10-CM | POA: Diagnosis not present

## 2020-07-08 DIAGNOSIS — I10 Essential (primary) hypertension: Secondary | ICD-10-CM | POA: Diagnosis not present

## 2020-07-08 DIAGNOSIS — Z7901 Long term (current) use of anticoagulants: Secondary | ICD-10-CM | POA: Diagnosis not present

## 2020-07-08 DIAGNOSIS — I482 Chronic atrial fibrillation, unspecified: Secondary | ICD-10-CM | POA: Diagnosis not present

## 2020-07-08 MED ORDER — METOPROLOL TARTRATE 100 MG PO TABS
100.0000 mg | ORAL_TABLET | Freq: Two times a day (BID) | ORAL | 3 refills | Status: DC
Start: 2020-07-08 — End: 2021-06-14

## 2020-07-08 NOTE — Patient Instructions (Addendum)
Medication Instructions:  Will Start taking Metoprolol Tartrate 100mg  (1 Tablet Twice Daily). Continue All Medications as Directed *If you need a refill on your cardiac medications before your next appointment, please call your pharmacy*   Lab Work: [facility] If you have labs (blood work) drawn today and your tests are completely normal, you will receive your results only by: [name] MyChart Message (if you have MyChart) OR . A paper copy in the mail If you have any lab test that is abnormal or we need to change your treatment, we will call you to review the results.   Testing/Procedures: None   Follow-Up: At [facility], you and your health needs are our priority.  As part of our continuing mission to provide you with exceptional heart care, we have created designated Provider Care Teams.  These Care Teams include your primary Cardiologist (physician) and Advanced Practice Providers (APPs -  Physician Assistants and Nurse Practitioners) who all work together to provide you with the care you need, when you need it.  We recommend signing up for the patient portal called "MyChart".  Sign up information is provided on this After Visit Summary.  MyChart is used to connect with patients for Virtual Visits (Telemedicine).  Patients are able to view lab/test results, encounter notes, upcoming appointments, etc.  Non-urgent messages can be sent to your provider as well.   To learn more about what you can do with MyChart, go to [email or web address].    Your next appointment:   August 12, 2020  The format for your next appointment:   In Person  Provider:   Minus Breeding, MD   Other Instructions  WEIGH DAILY, every am, wearing the same amount of clothing Record weights, contact Minus Breeding, MD for weight gain of 3 lbs in a day or 5 lbs in a week Limit sodium to 500 mg per meal, total 2000 mg per day Limit all liquids to 1.5-2 liters/quarts per day

## 2020-07-08 NOTE — Progress Notes (Signed)
Cardiology Office Note   Date:  07/08/2020   ID:  Riddle, Amy [birth year], MRN [id number]  PCP:  Leonard Downing, MD Cardiologist:  Minus Breeding, MD 06/13/2020 Electrphysiologist: None Rosaria Ferries, PA-C   No chief complaint on file.   History of Present Illness: Amy Riddle is a 74 y.o. female with a history of DM 2 (diet-controlled), HTN, OA, OHS, OSA could not tolerate CPAP, on O2, S-D-CHF w/ EF 45%, persistent atrial fib 09/2019 s/p DCCV.   7/12 office visit, 254 pounds, Cardizem decreased and beta-blocker increased, intolerant of ACE/ARB, consider hydralazine/nitrates in the future but titrate beta-blocker and take the Cardizem down. A. fib rate controlled, F/U 3 weeks  Amy Riddle presents for cardiology follow up.  She has not lost any more weight, home scales are 249-251 for the last 3 weeks. Peak weight was 314 lbs. She has lost 100 lbs 3 different times.   She has cut back on her salt. She is eating no processed foods. She does get rotisserie chicken, but is willing to cook her own chicken. She is trying to be aware of hidden sodium.  She is walking more, is not having to use the oxygen during the day. She uses BiPAP at night w/ O2.  She is not sleeping well on the BiPAP, says she will wear it for about 4 hours, but then go to bed about 130 without it and sleep.  She says she has been sleeping well.  She has not had any chest pain.  She is not aware of the atrial fibrillation.  She does not have presyncope or syncope.   Past Medical History:  Diagnosis Date   Arthritis    Asthma    Diabetes mellitus without complication ([place])    TYPE II no meds   Eczema    Fibroid    Hypertension    Osteoarthritis    Ovarian neoplasm    PRODUCING TESTOSTERONE   Submucous myoma of uterus     Past Surgical History:  Procedure Laterality Date   CARDIOVERSION N/A 10/05/2019   Procedure: CARDIOVERSION;  Surgeon: Fay Records,  MD;  Location: [place];  Service: Cardiovascular;  Laterality: N/A;   CARPAL TUNNEL RELEASE Left    CARPAL TUNNEL RELEASE Left 03/23/2015   Procedure: LEFT CARPAL TUNNEL RELEASE;  Surgeon: Leandrew Koyanagi, MD;  Location: [place];  Service: Orthopedics;  Laterality: Left;   DERMOID CYST  EXCISION     HERNIA REPAIR     X2   HYSTEROSCOPY     D & C   PELVIC LAPAROSCOPY      Current Outpatient Medications  Medication Sig Dispense Refill   acetaminophen (TYLENOL) 500 MG tablet Take 1,000 mg by mouth every 8 (eight) hours as needed for mild pain or moderate pain.     ADVAIR DISKUS 500-50 MCG/DOSE AEPB Inhale 1 puff into the lungs 2 (two) times daily.     albuterol (VENTOLIN HFA) 108 (90 Base) MCG/ACT inhaler Inhale 1-2 puffs into the lungs 2 (two) times daily as needed for shortness of breath.      Clobetasol Prop Emollient Base 0.05 % emollient cream Apply 1 application topically daily as needed (eczema).      diclofenac sodium (VOLTAREN) 1 % GEL Apply 2 g topically 2 (two) times daily.     diltiazem (CARDIZEM CD) 120 MG 24 hr capsule Take 1 capsule (120 mg total) by mouth daily. 90 capsule 1  ELIQUIS 5 MG TABS tablet TAKE 1 TABLET BY MOUTH TWICE A DAY 180 tablet 1   furosemide (LASIX) 40 MG tablet Take 40 mg by mouth 2 (two) times daily.      levalbuterol (XOPENEX) 1.25 MG/0.5ML nebulizer solution Take 0.63 mg by nebulization every 6 (six) hours as needed for wheezing or shortness of breath. 1 each 12   loratadine (CLARITIN) 10 MG tablet Take 10 mg by mouth daily as needed for allergies.     LORazepam (ATIVAN) 0.5 MG tablet Take 0.5 tablets (0.25 mg total) by mouth 2 (two) times daily as needed for anxiety or sleep. 30 tablet 0   metFORMIN (GLUCOPHAGE) 1000 MG tablet Take 500-1,000 mg by mouth See admin instructions. Take 1000 mg in the morning and 500 mg in the evening     metoprolol tartrate 75 MG TABS Take 75 mg by mouth 2 (two) times daily. 180 tablet 3    nystatin cream (MYCOSTATIN) Apply 1 application topically daily as needed (yeast under breast).      No current facility-administered medications for this visit.    Allergies:   Tylenol arthritis ext [acetaminophen]    Social History:  The patient  reports that she quit smoking about 46 years ago. She has never used smokeless tobacco. She reports that she does not drink alcohol and does not use drugs.   Family History:  The patient's family history includes Breast cancer in her maternal aunt; Cancer in her father; Heart disease in her maternal grandfather and mother; Hypertension in her brother and maternal grandmother.  She indicated that the status of her mother is unknown. She indicated that the status of her father is unknown. She indicated that the status of her brother is unknown. She indicated that the status of her maternal grandmother is unknown. She indicated that the status of her maternal grandfather is unknown. She indicated that the status of her maternal aunt is unknown.  ROS:  Please see the history of present illness. All other systems are reviewed and negative.    PHYSICAL EXAM: VS:  BP 128/65    Pulse (!) 104    Ht 5\' 2"  (1.575 m)    Wt 254 lb 12.8 oz (115.6 kg)    SpO2 95%    BMI 46.60 kg/m  , BMI Body mass index is 46.6 kg/m. GEN: Well nourished, well developed, female in no acute distress HEENT: normal for age  Neck: no JVD seen, difficult to assess secondary to body habitus, no carotid bruit, no masses Cardiac: Irregular rate and rhythm; no murmur, no rubs, or gallops Respiratory: Decreased breath sounds bases bilaterally, normal work of breathing GI: soft, nontender, nondistended, + BS MS: no deformity or atrophy; trace lower extremity edema; distal pulses are 2+ in all 4 extremities  Skin: warm and dry, no rash Neuro:  Strength and sensation are intact Psych: euthymic mood, full affect   EKG:  EKG is ordered today. The ekg ordered today demonstrates  atrial fib, RVR, R axis deviation, no sig change from 05/12/2020   ECHO: 05/12/2020 1. Left ventricular ejection fraction, by estimation, is 40 to 45%. The  left ventricle has mildly decreased function. The left ventricle  demonstrates global hypokinesis. There is mild concentric left ventricular  hypertrophy. Left ventricular diastolic  function could not be evaluated. There is the interventricular septum is  flattened in diastole ('D' shaped left ventricle), consistent with right  ventricular volume overload.  2. Right ventricular systolic function is mildly reduced. The right  ventricular size is moderately enlarged. There is moderately elevated  pulmonary artery systolic pressure.  3. Right atrial size was moderately dilated.  4. The aortic valve is tricuspid.  5. The inferior vena cava is dilated in size with <50% respiratory  variability, suggesting right atrial pressure of 15 mmHg.   MONITOR: 09/16/2019 Atrial fib with predominantly controlled ventricular rate Rapid atrial fib with aberrant conduction.   PVCs  Recent Labs: 05/10/2020: ALT 36 05/12/2020: Hemoglobin 15.2; Platelets 249 05/16/2020: Magnesium 2.3 05/18/2020: B Natriuretic Peptide 451.5 06/13/2020: BUN 17; Creatinine, Ser 1.00; Potassium 4.8; Sodium 143  CBC    Component Value Date/Time   WBC 9.9 05/12/2020 0440   RBC 5.65 (H) 05/12/2020 0440   HGB 15.2 (H) 05/12/2020 0440   HGB 15.8 10/01/2019 0954   HCT 51.0 (H) 05/12/2020 0440   HCT 49.3 (H) 10/01/2019 0954   PLT 249 05/12/2020 0440   PLT 235 10/01/2019 0954   MCV 90.3 05/12/2020 0440   MCV 85 10/01/2019 0954   MCH 26.9 05/12/2020 0440   MCHC 29.8 (L) 05/12/2020 0440   RDW 17.5 (H) 05/12/2020 0440   RDW 15.1 10/01/2019 0954   LYMPHSABS 0.9 05/10/2020 2010   MONOABS 1.4 (H) 05/10/2020 2010   EOSABS 0.0 05/10/2020 2010   BASOSABS 0.1 05/10/2020 2010   CMP Latest Ref Rng & Units 06/13/2020 05/18/2020 05/17/2020  Glucose 65 - 99 mg/dL 107(H) 137(H)  148(H)  BUN 8 - 27 mg/dL 17 46(H) 43(H)  Creatinine 0.57 - 1.00 mg/dL 1.00 1.17(H) 1.20(H)  Sodium 134 - 144 mmol/L 143 143 145  Potassium 3.5 - 5.2 mmol/L 4.8 4.2 4.4  Chloride 96 - 106 mmol/L 99 96(L) 96(L)  CO2 20 - 29 mmol/L 28 35(H) 42(H)  Calcium 8.7 - 10.3 mg/dL 9.5 9.0 9.1  Total Protein 6.5 - 8.1 g/dL - - -  Total Bilirubin 0.3 - 1.2 mg/dL - - -  Alkaline Phos 38 - 126 U/L - - -  AST 15 - 41 U/L - - -  ALT 0 - 44 U/L - - -     Lipid Panel No results found for: CHOL, HDL, LDLCALC, LDLDIRECT, TRIG, CHOLHDL    Wt Readings from Last 3 Encounters:  07/08/20 254 lb 12.8 oz (115.6 kg)  06/13/20 254 lb 9.6 oz (115.5 kg)  05/20/20 272 lb 4.3 oz (123.5 kg)     Other studies Reviewed: Additional studies/ records that were reviewed today include: Office notes, hospital records and testing.  ASSESSMENT AND PLAN:  1. Chronic atrial fibrillation: -Her heart rate is not well controlled -Even after she had been sitting a while, her heart rate was over 100, says her HR is never low -She is taking Cardizem CD 120 mg daily and metoprolol 75 mg twice daily -Increase the metoprolol to 100 mg twice daily.  If her blood pressure will not tolerate this, may be able to cut back on the Lasix.  2.  Chronic diastolic CHF: -She has mild lower extremity edema, but says that she gets this during the day. -She is compliant with the Lasix 40 mg twice daily.  She is not on potassium supplementation -Check a BMET today  3.  Chronic anticoagulation -She is compliant with Eliquis, no doses missed  4. HTN - BP at target, but believe she will tolerate a little more BB  - she is to let us know if BP starts dropping too low.    Current medicines are reviewed at length with the patient today.  The patient does not have concerns regarding medicines.  The following changes have been made: increase metoprolol from 75 mg bid >>100 mg bid  Labs/ tests ordered today include:   Orders Placed This  Encounter  Procedures   Basic Metabolic Panel (BMET)   EKG 12-Lead     Disposition:   FU with Minus Breeding, MD  Signed, Rosaria Ferries, PA-C  07/08/2020 5:35 PM    [place] Phone: [phone]; Fax: [phone]

## 2020-07-09 ENCOUNTER — Encounter: Payer: Self-pay | Admitting: Physician Assistant

## 2020-07-09 LAB — BASIC METABOLIC PANEL
BUN/Creatinine Ratio: 27 (ref 12–28)
BUN: 28 mg/dL — ABNORMAL HIGH (ref 8–27)
CO2: 24 mmol/L (ref 20–29)
Calcium: 9.7 mg/dL (ref 8.7–10.3)
Chloride: 101 mmol/L (ref 96–106)
Creatinine, Ser: 1.05 mg/dL — ABNORMAL HIGH (ref 0.57–1.00)
GFR calc Af Amer: 62 mL/min/{1.73_m2} (ref >59)
GFR calc non Af Amer: 54 mL/min/{1.73_m2} — ABNORMAL LOW (ref >59)
Glucose: 92 mg/dL (ref 65–99)
Potassium: 4.6 mmol/L (ref 3.5–5.2)
Sodium: 145 mmol/L — ABNORMAL HIGH (ref 134–144)

## 2020-07-21 ENCOUNTER — Ambulatory Visit: Payer: 59 | Admitting: Cardiology

## 2020-07-25 ENCOUNTER — Ambulatory Visit (INDEPENDENT_AMBULATORY_CARE_PROVIDER_SITE_OTHER): Payer: 59

## 2020-07-25 ENCOUNTER — Ambulatory Visit: Payer: 59 | Admitting: Pulmonary Disease

## 2020-07-25 ENCOUNTER — Encounter: Payer: Self-pay | Admitting: Pulmonary Disease

## 2020-07-25 ENCOUNTER — Other Ambulatory Visit: Payer: Self-pay

## 2020-07-25 VITALS — BP 122/80 | HR 97 | Temp 97.2°F | Ht 62.0 in | Wt 249.6 lb

## 2020-07-25 DIAGNOSIS — J9612 Chronic respiratory failure with hypercapnia: Secondary | ICD-10-CM

## 2020-07-25 DIAGNOSIS — R0602 Shortness of breath: Secondary | ICD-10-CM | POA: Diagnosis not present

## 2020-07-25 DIAGNOSIS — J90 Pleural effusion, not elsewhere classified: Secondary | ICD-10-CM

## 2020-07-25 DIAGNOSIS — G4733 Obstructive sleep apnea (adult) (pediatric): Secondary | ICD-10-CM

## 2020-07-25 DIAGNOSIS — I5041 Acute combined systolic (congestive) and diastolic (congestive) heart failure: Secondary | ICD-10-CM

## 2020-07-25 DIAGNOSIS — J45998 Other asthma: Secondary | ICD-10-CM | POA: Insufficient documentation

## 2020-07-25 DIAGNOSIS — J9611 Chronic respiratory failure with hypoxia: Secondary | ICD-10-CM

## 2020-07-25 NOTE — Assessment & Plan Note (Signed)
Appears to be controlled on Advair. She will need PFTs in the future to clarify. Meantime use Xopenex nebs as needed for bronchospasm

## 2020-07-25 NOTE — Assessment & Plan Note (Signed)
Much improved now, she has seen cardiology. Compliant with Lasix twice daily

## 2020-07-25 NOTE — Assessment & Plan Note (Signed)
Likely that she has baseline hypercarbia in the 50-60 range on the basis of obesity hypoventilation and this was exacerbated by acute systolic heart failure causing hypoxia. Hypoxia seems to have resolved now and she has weaned herself off oxygen. We will need sleep study evaluation for underlying OSA/OHS  Given excessive daytime somnolence, narrow pharyngeal exam, witnessed apneas & loud snoring, obstructive sleep apnea is very likely & an overnight polysomnogram will be scheduled as a split study. The pathophysiology of obstructive sleep apnea , it's cardiovascular consequences & modes of treatment including PAP were discused with the patient in detail & they evidenced understanding.

## 2020-07-25 NOTE — Progress Notes (Signed)
Subjective:    Patient ID: Amy Riddle, female    DOB: 1946/07/14, 74 y.o.   MRN: [id number]  HPI  74 year old admitted after hospital visit for acute respiratory failure, discharged on 3 L oxygen. CODE STATUS DNR  PMH - A-Fib - on eliquis, cardizem.  Has required cardioversion   HTN - on losartan, lasix.  Prior LVEF 40-45% Asthma - on advair  DM II - on metformin  Morbid Obesity  Former Smoker - quit 1975  She was hospitalized 6/8-6/18, presented with shortness of breath, hypoxia and bilateral lower extremity edema, with mild leukocytosis 13.1 and AKI with creatinine elevated to 2.7 Chest x-ray showed right pleural effusion with cardiomegaly and vascular congestion  ABG showed hypercarbia 7.2 5/82/74 on 60% FiO2 Echo showed EF 40 to 45% She was treated with Lasix, bronchodilators, Pulmicort and Brovana and also empiric ceftriaxone and azithromycin.  BiPAP was used and transitioned to nightly she was discharged on 3 L of oxygen and trilogy   Since discharge, her breathing has improved, she has weaned herself off oxygen, oxygen saturations varied from 95 to 97% on her home oximeter. Weight on discharge was 249 pounds, weight on admission was 285 pounds.  She uses Advair twice daily and uses Xopenex nebs 3 times a week. Admits that she is not using trilogy machine as much but tries to use it at least 4 hours every night.  Lies down around 830 with her machine on and takes machine off around 1 AM, stays in bed until 7 AM when she gets out of bed, sleeps with one pillow on the right side, no orthopnea no breathing issues. Compliant with 40 mg twice daily Lasix.   Chest x-ray today shows improved bilateral infiltrates and improved aeration, prominent bilateral hilar regions -personally reviewed    Significant tests/ events reviewed   Limited ECHO 6/9 >> LVEF ~ 40-45%, LV mildly decreased function, LV demonstrates global hypokinesis, interventricular septum flattened in  systole consistent with RV pressure overload, RV systolic function moderately reduced, RV is severely enlarged, LA mildly dilated, RA severely dilated  Renal US 6/9 >> no mass or hydronephrosis, small amount left-sided perinephric fluid  V/Q scan 6/11 >> low probability for PE    Review of Systems Breathing improved Chest is sore, no chest pain, palpitations No nausea, vomiting, diarrhea, abdominal pain Weight has decreased No fever, chills or cough    Objective:   Physical Exam   Gen. Pleasant, obese, in no distress, normal affect ENT - no pallor,icterus, no post nasal drip, class 2-3 airway Neck: No JVD, no thyromegaly, no carotid bruits Lungs: no use of accessory muscles, no dullness to percussion, decreased on RT without rales or rhonchi  Cardiovascular: Rhythm regular, heart sounds  normal, no murmurs or gallops, no peripheral edema Abdomen: soft and non-tender, no hepatosplenomegaly, BS normal. Musculoskeletal: No deformities, no cyanosis or clubbing Neuro:  alert, non focal, no tremors        Assessment & Plan:  Assessment:   . 1 or more chronic illnesses with severe exacerbation, progression, or side effects of treatment;  . 1 acute or chronic illness or injury that poses a threat to life or bodily function  Plan Following Extensive Data Review & Interpretation:  . I reviewed prior external note(s) from discharge summary, cardiology consultation . I reviewed the result(s) of chest x-ray . I have ordered sleep study, chest x-ray  Independent interpretation of tests . Review of patient's chest x-ray images revealed left pleural effusion,  improved on 6/17 compared to 6/10. The patient's images have been independently reviewed by me.    Discussion of management or test interpretation with another colleague PCP.

## 2020-07-25 NOTE — Assessment & Plan Note (Signed)
Follow-up chest x-ray today to assess

## 2020-07-25 NOTE — Patient Instructions (Signed)
Ambulatory saturation - OK to stay off oxygen daytime CXR today to follow up on fluid around lungs Split sleep study

## 2020-07-26 ENCOUNTER — Telehealth: Payer: Self-pay | Admitting: Pulmonary Disease

## 2020-07-26 NOTE — Telephone Encounter (Signed)
atc patient unable to reach left message to call office back  Per RA last OV 07/25/20  Instructions    Return in about 2 months (around 09/24/2020) for TP / me. Ambulatory saturation - OK to stay off oxygen daytime CXR today to follow up on fluid around lungs Split sleep study

## 2020-07-27 NOTE — Telephone Encounter (Signed)
Pt returned called already received info about oxygen and that she can stay off o2 during the day - she will await call from [facility] about sleep study. Patient advised that she contacted [employer] to contact us to pick up tanks - pt needs nothing further -pr

## 2020-08-01 ENCOUNTER — Telehealth: Payer: Self-pay | Admitting: Pulmonary Disease

## 2020-08-01 DIAGNOSIS — J9611 Chronic respiratory failure with hypoxia: Secondary | ICD-10-CM

## 2020-08-01 NOTE — Telephone Encounter (Signed)
Spoke with the pt  She states not using o2 at all any longer and wants [facility] to come and pick up  Please advise if okay to send order, thanks

## 2020-08-01 NOTE — Telephone Encounter (Signed)
Okay to DC oxygen

## 2020-08-01 NOTE — Telephone Encounter (Signed)
Spoke with patient regarding prior message . Per Dr.Alva ok to DC Oxygen will contact [facility]. Patient's voice was understanding. Nothing else further needed.

## 2020-08-02 ENCOUNTER — Other Ambulatory Visit: Payer: Self-pay

## 2020-08-09 NOTE — Telephone Encounter (Signed)
Order has been placed. Nothing further needed. 

## 2020-08-09 NOTE — Telephone Encounter (Signed)
Pt called checking on status of oxygen being discontinued. Checked to see if an order had been placed and one had not.  Pt has been made aware that we will place order to have oxygen be discontinued as RA said it was okay for it to be discontinued.  With RA being on vacation, Beth, please advise if you are okay with [place] placing this order under you so Huey Romans can go get O2 from pt's place.

## 2020-08-09 NOTE — Addendum Note (Signed)
Addended by: Lorretta Harp on: 08/09/2020 10:25 AM   Modules accepted: Orders

## 2020-08-09 NOTE — Addendum Note (Signed)
Addended by: Lorretta Harp on: 08/09/2020 12:17 PM   Modules accepted: Orders

## 2020-08-09 NOTE — Telephone Encounter (Signed)
Yes

## 2020-08-10 ENCOUNTER — Telehealth: Payer: Self-pay | Admitting: Primary Care

## 2020-08-10 DIAGNOSIS — J9612 Chronic respiratory failure with hypercapnia: Secondary | ICD-10-CM

## 2020-08-10 NOTE — Telephone Encounter (Signed)
Response from DME to D/C O2:  [place] sent to [facility]; [place]; [place], [place]; [place], [place] w/ pt she stated we only need to p/u portables not all of the 02. Please clarify if it is all of the 02 or just portables. Will need a different rx if portables are the only thing to be p/u.   Thank you

## 2020-08-11 NOTE — Telephone Encounter (Signed)
New order placed for just portables to be picked up since pt not using daytime o2 any longer.

## 2020-08-11 NOTE — Progress Notes (Signed)
Cardiology Office Note   Date:  08/12/2020   ID:  Amy Riddle 05-11-1946, MRN [id number]  PCP:  Leonard Downing, MD  Cardiologist:   Minus Breeding, MD    Chief Complaint  Patient presents with  . Shortness of Breath      History of Present Illness: Amy Riddle is a 74 y.o. female who presents for follow up of SOB.  She was in the hospital in June with respiratory distress.   She was treated for multifactorial respiratory failure.  She had sleep apnea and hypoventilation obesity syndrome.  She was also thought to have acute systolic and diastolic HF.  She was treated with antibiotics.  At the last visit she had her metoprolol increased.     Since she was last seen she has done very well.  She is lost another 10 pounds.  She following a very strict low-salt diet.  She is feeling better and says she is not having any of the shortness of breath or swelling that she was having.  She denies any chest pressure, neck or arm discomfort.  She has had no palpitations, presyncope or syncope.  She had no PND or orthopnea.   Past Medical History:  Diagnosis Date  . Arthritis   . Asthma   . Diabetes mellitus without complication (HCC)    TYPE II no meds  . Eczema   . Fibroid   . Hypertension   . Osteoarthritis   . Ovarian neoplasm    PRODUCING TESTOSTERONE  . Sleep apnea    BiPAP with O2  . Submucous myoma of uterus     Past Surgical History:  Procedure Laterality Date  . CARDIOVERSION N/A 10/05/2019   Procedure: CARDIOVERSION;  Surgeon: Fay Records, MD;  Location: [facility];  Service: Cardiovascular;  Laterality: N/A;  . CARPAL TUNNEL RELEASE Left   . CARPAL TUNNEL RELEASE Left 03/23/2015   Procedure: LEFT CARPAL TUNNEL RELEASE;  Surgeon: Leandrew Koyanagi, MD;  Location: [facility];  Service: Orthopedics;  Laterality: Left;  . DERMOID CYST  EXCISION    . HERNIA REPAIR     X2  . HYSTEROSCOPY     D & C  . PELVIC LAPAROSCOPY        Current Outpatient Medications  Medication Sig Dispense Refill  . acetaminophen (TYLENOL) 500 MG tablet Take 1,000 mg by mouth every 8 (eight) hours as needed for mild pain or moderate pain.    [name] ADVAIR DISKUS 500-50 MCG/DOSE AEPB Inhale 1 puff into the lungs 2 (two) times daily.    [name] albuterol (VENTOLIN HFA) 108 (90 Base) MCG/ACT inhaler Inhale 1-2 puffs into the lungs 2 (two) times daily as needed for shortness of breath.     . Clobetasol Prop Emollient Base 0.05 % emollient cream Apply 1 application topically daily as needed (eczema).     . diclofenac sodium (VOLTAREN) 1 % GEL Apply 2 g topically 2 (two) times daily.    [name] diltiazem (CARDIZEM CD) 120 MG 24 hr capsule Take 1 capsule (120 mg total) by mouth daily. 90 capsule 1  . ELIQUIS 5 MG TABS tablet TAKE 1 TABLET BY MOUTH TWICE A DAY 180 tablet 1  . furosemide (LASIX) 40 MG tablet Take 40 mg by mouth 2 (two) times daily.     [name] levalbuterol (XOPENEX) 1.25 MG/0.5ML nebulizer solution Take 0.63 mg by nebulization every 6 (six) hours as needed for wheezing or shortness of breath. 1 each 12  .  loratadine (CLARITIN) 10 MG tablet Take 10 mg by mouth daily as needed for allergies.    [name] LORazepam (ATIVAN) 0.5 MG tablet Take 0.5 tablets (0.25 mg total) by mouth 2 (two) times daily as needed for anxiety or sleep. 30 tablet 0  . metFORMIN (GLUCOPHAGE) 1000 MG tablet Take 500-1,000 mg by mouth See admin instructions. Take 1000 mg in the morning and 500 mg in the evening    . metoprolol tartrate (LOPRESSOR) 100 MG tablet Take 1 tablet (100 mg total) by mouth 2 (two) times daily. 180 tablet 3  . nystatin cream (MYCOSTATIN) Apply 1 application topically daily as needed (yeast under breast).      No current facility-administered medications for this visit.    Allergies:   Tylenol arthritis ext [acetaminophen]    ROS:  Please see the history of present illness.   Otherwise, review of systems are positive for none.   All other systems are reviewed  and negative.    PHYSICAL EXAM: VS:  BP 118/70   Pulse 68   Ht 5\' 2"  (1.575 m)   Wt 244 lb (110.7 kg)   BMI 44.63 kg/m  , BMI Body mass index is 44.63 kg/m. GENERAL:  Well appearing NECK:  No jugular venous distention, waveform within normal limits, carotid upstroke brisk and symmetric, no bruits, no thyromegaly LUNGS:  Clear to auscultation bilaterally CHEST:  Unremarkable HEART:  PMI not displaced or sustained,S1 and S2 within normal limits, no S3, no S4, no clicks, no rubs, no murmurs ABD:  Flat, positive bowel sounds normal in frequency in pitch, no bruits, no rebound, no guarding, no midline pulsatile mass, no hepatomegaly, no splenomegaly EXT:  2 plus pulses throughout, no edema, no cyanosis no clubbing   EKG:  EKG is not ordered today. NA   Recent Labs: 05/10/2020: ALT 36 05/12/2020: Hemoglobin 15.2; Platelets 249 05/16/2020: Magnesium 2.3 05/18/2020: B Natriuretic Peptide 451.5 07/08/2020: BUN 28; Creatinine, Ser 1.05; Potassium 4.6; Sodium 145    Lipid Panel No results found for: CHOL, TRIG, HDL, CHOLHDL, VLDL, LDLCALC, LDLDIRECT    Wt Readings from Last 3 Encounters:  08/12/20 244 lb (110.7 kg)  07/25/20 249 lb 9.6 oz (113.2 kg)  07/08/20 254 lb 12.8 oz (115.6 kg)      Other studies Reviewed: Additional studies/ records that were reviewed today include:  None. Review of the above records demonstrates:  Please see elsewhere in the note.     ASSESSMENT AND PLAN:  CHRONIC SYSTOLIC AND DIASTOLIC HF:    She is doing much better.  She is following diet.  She is then start walking.  No change in therapy.  CHRONIC ATRIAL FIB: She tolerates anticoagulation.   Ms. Amy Riddle has a CHA2DS2 - VASc score of 4.    She has had reasonable rate control.  SLEEP APNEA:    She wears BiPAP.  She is going to have a follow-up sleep study  COVID EDUCATION:    She has been vaccinated.  Current medicines are reviewed at length with the patient today.  The patient does not  have concerns regarding medicines.  The following changes have been made:  None  Labs/ tests ordered today include:    None  No orders of the defined types were placed in this encounter.    Disposition:   FU with me in six months.  Ronnell Guadalajara, MD  08/12/2020 11:20 AM    [place]

## 2020-08-12 ENCOUNTER — Other Ambulatory Visit: Payer: Self-pay

## 2020-08-12 ENCOUNTER — Encounter: Payer: Self-pay | Admitting: Cardiology

## 2020-08-12 ENCOUNTER — Ambulatory Visit: Payer: 59 | Admitting: Cardiology

## 2020-08-12 VITALS — BP 118/70 | HR 68 | Ht 62.0 in | Wt 244.0 lb

## 2020-08-12 DIAGNOSIS — I482 Chronic atrial fibrillation, unspecified: Secondary | ICD-10-CM

## 2020-08-12 DIAGNOSIS — I5041 Acute combined systolic (congestive) and diastolic (congestive) heart failure: Secondary | ICD-10-CM | POA: Diagnosis not present

## 2020-08-12 NOTE — Patient Instructions (Signed)
Medication Instructions:  Your physician recommends that you continue on your current medications as directed. Please refer to the Current Medication list given to you today.  *If you need a refill on your cardiac medications before your next appointment, please call your pharmacy*  Lab Work: NONE  Testing/Procedures: NONE  Follow-Up: At [facility], you and your health needs are our priority.  As part of our continuing mission to provide you with exceptional heart care, we have created designated Provider Care Teams.  These Care Teams include your primary Cardiologist (physician) and Advanced Practice Providers (APPs -  Physician Assistants and Nurse Practitioners) who all work together to provide you with the care you need, when you need it.  We recommend signing up for the patient portal called "MyChart".  Sign up information is provided on this After Visit Summary.  MyChart is used to connect with patients for Virtual Visits (Telemedicine).  Patients are able to view lab/test results, encounter notes, upcoming appointments, etc.  Non-urgent messages can be sent to your provider as well.   To learn more about what you can do with MyChart, go to [email or web address].    Your next appointment:   6 month(s)  The format for your next appointment:   In Person  Provider:   You may see Minus Breeding, MD or one of the following Advanced Practice Providers on your designated Care Team:    Rosaria Ferries, PA-C  Jory Sims, DNP, ANP  Cadence Kathlen Mody, [place]

## 2020-08-29 ENCOUNTER — Other Ambulatory Visit: Payer: 59 | Admitting: Hospice

## 2020-08-29 ENCOUNTER — Other Ambulatory Visit: Payer: Self-pay

## 2020-08-29 DIAGNOSIS — Z515 Encounter for palliative care: Secondary | ICD-10-CM

## 2020-08-29 DIAGNOSIS — I5041 Acute combined systolic (congestive) and diastolic (congestive) heart failure: Secondary | ICD-10-CM

## 2020-08-29 NOTE — Progress Notes (Signed)
[facility] Consult Note Telephone: [phone]  Fax: [phone]  PATIENT NAME: Amy Riddle DOB: 1946/06/03 MRN: [id number]  PRIMARY CARE PROVIDER:   Leonard Downing, MD Leonard Downing, MD [address] [place],  [place] [address]  REFERRING PROVIDER: Leonard Downing, MD Leonard Downing, MD [place],  [place] [address]  RESPONSIBLE PARTY:  Shandelle, Borrelli (Spouse)  [phone] [facility] Phone)   Brunetta Genera [phone]  TELEHEALTH VISIT STATEMENT Due to the COVID-19 crisis, this visit was done via telephone from my office. It was initiated and consented to by this patient and/or family.    RECOMMENDATIONS/PLAN:   Advance Care Planning: Visit consisted of building trust and discussions on Palliative Medicine as specialized medical care for people living with serious illness, aimed at facilitating better quality of life through symptoms relief, assisting with advance care plan and establishing goals of care.   Code Status: CODE STATUS reviewed.  Patient is a DO NOT RESUSCITATE.  Goals of Care: Goals of care include to maximize quality of life and symptom management.  Patient also wishes to lose 40 more pounds current weight is 245 pounds down from 315 pounds 1 year ago per patient.  Follow up: Palliative care will continue to follow patient for goals of care clarification and symptom management.  Follow-up in a month for in person visit.  Functional status/Symptom management: Patient in no medical acuity, denied pain/discomfort; no respiratory distress, no longer on oxygen during the day.  She says she only uses oxygen at night with BiPAP.  She is scheduled for sleep study this Thursday, 09/01/2020.  She is compliant with her medications and reports no adverse reactions/swelling from Cardizem 120 mg daily.  This is a reduction from 360 mg daily before that she reported caused her  some swelling.  She is followed by Cardiology.  Type 2 diabetes mellitus currently well managed with Metformin A1c 3 months ago 6.2.  Patient continues on lifestyle modifications.  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  NP provided validation and reiterated the need to continue with ongoing lifestyle modifications.  Patient was happy to report that she has lost weight-  current weight is 245 pounds down from 315 pounds.  She said her target is to lose 40 more pounds.   Patient was hospitalized 05/10/2020 to 05/20/2020 for shortness of breath,,  acute on chronic respiratory failure with hypoxia and hypercapnia related to history of asthma.  She was also treated with diuretic; currently with no shortness of breath/cough.  She continues with Lasix, CPAP at night and compliant with her breathing treatments: Advair daily as ordered and albuterol as needed.  Patient endorsed she is doing well, with no concerns at this time.  Palliative will continue to monitor for symptom management/decline and make recommendations as needed.  Family /Caregiver/Community Supports:  Patient lives at home with spouse who is involved in her care. I spent 46 minutes providing this consultation; time includes time spent with patient/family, chart review,  and documentation. More than 50% of the time in this consultation was spent on coordinating communication  HISTORY OF PRESENT ILLNESS:  Amy Riddle is a 74 y.o.  female with medical history significant for asthma with excerbations, HTN, permanent atrial fibrillation (Eliquis/Cardizem), Type 2 DM (oral meds and SSI), morbid obesity.  CODE STATUS: DNR  PPS: 70%  HOSPICE ELIGIBILITY/DIAGNOSIS: TBD  PAST MEDICAL  HISTORY:  Past Medical History:  Diagnosis Date  . Arthritis   . Asthma   . Diabetes mellitus without complication (HCC)    TYPE II no meds  . Eczema   . Fibroid   . Hypertension   . Osteoarthritis     . Ovarian neoplasm    PRODUCING TESTOSTERONE  . Sleep apnea    BiPAP with O2  . Submucous myoma of uterus     SOCIAL HX:  Social History   Tobacco Use  . Smoking status: Former Smoker    Quit date: 03/26/1974    Years since quitting: 46.4  . Smokeless tobacco: Never Used  Substance Use Topics  . Alcohol use: No    ALLERGIES:  Allergies  Allergen Reactions  . Tylenol Arthritis Ext [Acetaminophen] Other (See Comments)    Does not help     PERTINENT MEDICATIONS:  Outpatient Encounter Medications as of 08/29/2020  Medication Sig  . acetaminophen (TYLENOL) 500 MG tablet Take 1,000 mg by mouth every 8 (eight) hours as needed for mild pain or moderate pain.  [name] ADVAIR DISKUS 500-50 MCG/DOSE AEPB Inhale 1 puff into the lungs 2 (two) times daily.  [name] albuterol (VENTOLIN HFA) 108 (90 Base) MCG/ACT inhaler Inhale 1-2 puffs into the lungs 2 (two) times daily as needed for shortness of breath.   . Clobetasol Prop Emollient Base 0.05 % emollient cream Apply 1 application topically daily as needed (eczema).   . diclofenac sodium (VOLTAREN) 1 % GEL Apply 2 g topically 2 (two) times daily.  [name] diltiazem (CARDIZEM CD) 120 MG 24 hr capsule Take 1 capsule (120 mg total) by mouth daily.  [name] ELIQUIS 5 MG TABS tablet TAKE 1 TABLET BY MOUTH TWICE A DAY  . furosemide (LASIX) 40 MG tablet Take 40 mg by mouth 2 (two) times daily.   [name] levalbuterol (XOPENEX) 1.25 MG/0.5ML nebulizer solution Take 0.63 mg by nebulization every 6 (six) hours as needed for wheezing or shortness of breath.  . loratadine (CLARITIN) 10 MG tablet Take 10 mg by mouth daily as needed for allergies.  [name] LORazepam (ATIVAN) 0.5 MG tablet Take 0.5 tablets (0.25 mg total) by mouth 2 (two) times daily as needed for anxiety or sleep.  . metFORMIN (GLUCOPHAGE) 1000 MG tablet Take 500-1,000 mg by mouth See admin instructions. Take 1000 mg in the morning and 500 mg in the evening  . metoprolol tartrate (LOPRESSOR) 100 MG tablet Take 1 tablet (100  mg total) by mouth 2 (two) times daily.  [name] nystatin cream (MYCOSTATIN) Apply 1 application topically daily as needed (yeast under breast).    No facility-administered encounter medications on file as of 08/29/2020.     Teodoro Spray, NP

## 2020-09-01 ENCOUNTER — Ambulatory Visit (HOSPITAL_BASED_OUTPATIENT_CLINIC_OR_DEPARTMENT_OTHER): Payer: 59 | Attending: Primary Care | Admitting: Pulmonary Disease

## 2020-09-01 ENCOUNTER — Other Ambulatory Visit: Payer: Self-pay

## 2020-09-01 DIAGNOSIS — I493 Ventricular premature depolarization: Secondary | ICD-10-CM | POA: Insufficient documentation

## 2020-09-01 DIAGNOSIS — E669 Obesity, unspecified: Secondary | ICD-10-CM | POA: Diagnosis not present

## 2020-09-01 DIAGNOSIS — G4733 Obstructive sleep apnea (adult) (pediatric): Secondary | ICD-10-CM | POA: Insufficient documentation

## 2020-09-01 DIAGNOSIS — Z6841 Body Mass Index (BMI) 40.0 and over, adult: Secondary | ICD-10-CM | POA: Diagnosis not present

## 2020-09-01 DIAGNOSIS — G4736 Sleep related hypoventilation in conditions classified elsewhere: Secondary | ICD-10-CM | POA: Insufficient documentation

## 2020-09-01 DIAGNOSIS — J9611 Chronic respiratory failure with hypoxia: Secondary | ICD-10-CM

## 2020-09-01 DIAGNOSIS — I4891 Unspecified atrial fibrillation: Secondary | ICD-10-CM | POA: Diagnosis not present

## 2020-09-01 DIAGNOSIS — R0602 Shortness of breath: Secondary | ICD-10-CM

## 2020-09-09 ENCOUNTER — Telehealth: Payer: Self-pay | Admitting: Pulmonary Disease

## 2020-09-09 DIAGNOSIS — G4733 Obstructive sleep apnea (adult) (pediatric): Secondary | ICD-10-CM

## 2020-09-09 DIAGNOSIS — J9612 Chronic respiratory failure with hypercapnia: Secondary | ICD-10-CM | POA: Diagnosis not present

## 2020-09-09 DIAGNOSIS — J9611 Chronic respiratory failure with hypoxia: Secondary | ICD-10-CM | POA: Diagnosis not present

## 2020-09-09 NOTE — Procedures (Signed)
Patient Name: Amy Riddle, Amy Riddle Date: 09/01/2020 Gender: Female D.O.B: 1945/12/10 Age (years): 57 Referring Provider: Kara Mead MD, ABSM Height (inches): 62 Interpreting Physician: Kara Mead MD, ABSM Weight (lbs): 245 RPSGT: Baxter Flattery BMI: 45 MRN: [id number] Neck Size: 16.00 <br> <br> CLINICAL INFORMATION Sleep Study Type: NPSG    Indication for sleep study: Fatigue, Obesity, Snoring, Witnesses Apnea / Gasping During Sleep ,hospitalisation for acute on chronic hypercarbic respiratory failure , discharged on trilogy NIV    Epworth Sleepiness Score: 4    SLEEP STUDY TECHNIQUE As per the AASM Manual for the Scoring of Sleep and Associated Events v2.3 (April 2016) with a hypopnea requiring 4% desaturations.  The channels recorded and monitored were frontal, central and occipital EEG, electrooculogram (EOG), submentalis EMG (chin), nasal and oral airflow, thoracic and abdominal wall motion, anterior tibialis EMG, snore microphone, electrocardiogram, and pulse oximetry.  MEDICATIONS Medications self-administered by patient taken the night of the study : N/A  SLEEP ARCHITECTURE The study was initiated at 10:42:41 PM and ended at 4:53:15 AM.  Sleep onset time was 46.6 minutes and the sleep efficiency was 68.4%%. The total sleep time was 253.5 minutes.  Stage REM latency was 190.0 minutes.  The patient spent 3.9%% of the night in stage N1 sleep, 89.2%% in stage N2 sleep, 0.0%% in stage N3 and 6.9% in REM.  Alpha intrusion was absent.  Supine sleep was 10.26%.  RESPIRATORY PARAMETERS The overall apnea/hypopnea index (AHI) was 18.9 per hour. There were 26 total apneas, including 26 obstructive, 0 central and 0 mixed apneas. There were 54 hypopneas and 0 RERAs.  The AHI during Stage REM sleep was 27.4 per hour.  AHI while supine was 32.3 per hour.  The mean oxygen saturation was 89.9%. The minimum SpO2 during sleep was 66.0%.  loud snoring was noted during  this study.  CARDIAC DATA The 2 lead EKG demonstrated sinus rhythm. The mean heart rate was 33.9 beats per minute. Other EKG findings include: Atrial Fibrillation, PVCs. LEG MOVEMENT DATA The total PLMS were 0 with a resulting PLMS index of 0.0. Associated arousal with leg movement index was 0.7 .  IMPRESSIONS - Moderate obstructive sleep apnea occurred during this study (AHI = 18.9/h). - No significant central sleep apnea occurred during this study (CAI = 0.0/h). - Severe oxygen desaturation was noted during this study (Min O2 = 66.0%) without respiratory events suggesting underlying cardiopulmonary disease. 2L Oxygen was applied around 0130 am - The patient snored with loud snoring volume. - EKG findings include Atrial Fibrillation, PVCs. - Clinically significant periodic limb movements did not occur during sleep. No significant associated arousals.   DIAGNOSIS - Obstructive Sleep Apnea (G47.33) - Nocturnal Hypoxemia (G47.36)   RECOMMENDATIONS - Therapeutic CPAP titration to determine optimal pressure required to alleviate sleep disordered breathing. - Positional therapy avoiding supine position during sleep. - 2L Oxygen during sleep - Avoid alcohol, sedatives and other CNS depressants that may worsen sleep apnea and disrupt normal sleep architecture. - Sleep hygiene should be reviewed to assess factors that may improve sleep quality. - Weight management and regular exercise should be initiated or continued if appropriate.   Kara Mead MD Board Certified in [place]

## 2020-09-09 NOTE — Telephone Encounter (Signed)
SG showed moderate OSA 18/h with 2 L O2 required Proceed with PAP titration study  Please order & place in comment -(OK to use bipap if needed)   Schedule oV with me after study

## 2020-09-12 NOTE — Telephone Encounter (Signed)
Called and spoke with patient about SG result per Dr Elsworth Soho. Patient expressed understanding and ok to place order for PAP titration study. Order placed per Dr Elsworth Soho. Comment added to order per Dr Elsworth Soho. Patient agreeable and expressed full understanding and will call to schedule follow up office visit once she knows when the study will be.

## 2020-09-13 NOTE — Progress Notes (Signed)
Please let patient know split night sleep study showed moderate OSA. Needs to be set up with Auto CPAP  5-15 and needs 2L oxygen blended in. FU in 5-6 weeks

## 2020-09-22 ENCOUNTER — Ambulatory Visit: Payer: 59 | Admitting: Pulmonary Disease

## 2020-09-28 ENCOUNTER — Other Ambulatory Visit: Payer: Self-pay

## 2020-09-28 ENCOUNTER — Other Ambulatory Visit: Payer: 59 | Admitting: Hospice

## 2020-09-28 DIAGNOSIS — Z515 Encounter for palliative care: Secondary | ICD-10-CM

## 2020-09-28 NOTE — Progress Notes (Signed)
[facility] Consult Note Telephone: [phone]  Fax: [phone]  PATIENT NAME: Amy Riddle DOB: August 08, 1946 MRN: [id number]  PRIMARY CARE PROVIDER:   Leonard Downing, MD Leonard Downing, MD [address] [place],  [place] [address]  REFERRING PROVIDER: Leonard Downing, MD Leonard Downing, MD [place],  [place] [address]  RESPONSIBLE PARTY:Bomba,Donald L (Spouse)  [phone] [facility] Phone) Brunetta Genera [phone]    RECOMMENDATIONS/PLAN:   Advance Care Planning: Visit consisted of building trust and discussions on Palliative Medicine as specialized medical care for people living with serious illness, aimed at facilitating better quality of life through symptoms relief, assisting with advance care plan and establishing goals of care.   Code Status: CODE STATUS reviewed.  Patient is a DO NOT RESUSCITATE. No DNR at home. NP signed a DNR form for patient; same document uploaded to Epic.   Goals of Care: Goals of care include to maximize quality of life and symptom management.  Patient wishes to lose 40 more pounds current weight is 240 pounds down from 315 pounds 1 year ago per patient.  Follow up: Palliative care will continue to follow patient for goals of care clarification and symptom management.  Follow-up in a month.  Functional status/Symptom management: Recent sleep study shows moderate sleep apnea; appointment to follow up in October 18 2020. Appointment  10/31/2020 with Pulmonologist. She is compliant with her medications and reports no adverse reactions/swelling from Cardizem 120 mg daily.  This is a reduction from 360 mg daily before that she reported caused her some swelling.  She is followed by Cardiology. She has oxygen concentrator at home.    She says she only uses oxygen at night for 4 hours of BiPAP.   Type 2 diabetes mellitus currently well  managed with Metformin. Current  A1c 3  6.2.  Patient continues on lifestyle modifications- she reads food labels and is very particular about avoiding high sodium foods.  NP provided validation and reiterated the need to continue with ongoing lifestyle modifications.  Patient was happy to report that she has lost weight-  current weight is 240 pounds down from 315 pounds.  She said her target is to lose 40 more pounds.    Previous visit 08/29/2020:Patient was hospitalized 05/10/2020 to 05/20/2020 for shortness of breath,,  acute on chronic respiratory failure with hypoxia and hypercapnia related to history of asthma.  She was also treated with diuretic; currently with no shortness of breath/cough.  She continues with Lasix, CPAP at night and compliant with her breathing treatments for Asthma: Advair daily as ordered and albuterol as needed.    Family /Caregiver/Community Supports: Patient lives at home with spouse who is involved in her care. I spent 50 minutes providing this consultation; time includes time spent with patient/family, chart review,  and documentation. More than 50% of the time in this consultation was spent on coordinating communication  HISTORY OF PRESENT ILLNESS:Amy F Campbellis a 74 y.o. femalewith medical history significant for asthma with excerbations, HTN, permanent atrial fibrillation (Eliquis/Cardizem), Type 2 DM (oral meds and SSI), morbid obesity.  CODE STATUS: DNR  PPS: 70%  HOSPICE ELIGIBILITY/DIAGNOSIS: TBD  PAST MEDICAL HISTORY:  Past Medical History:  Diagnosis Date  . Arthritis   . Asthma   . Diabetes mellitus without complication (HCC)    TYPE II no meds  . Eczema   . Fibroid   . Hypertension   . Osteoarthritis   .  Ovarian neoplasm    PRODUCING TESTOSTERONE  . Sleep apnea    BiPAP with O2  . Submucous myoma of uterus     SOCIAL HX:  Social History   Tobacco Use  . Smoking status: Former Smoker    Quit date: 03/26/1974    Years  since quitting: 46.5  . Smokeless tobacco: Never Used  Substance Use Topics  . Alcohol use: No    ALLERGIES:  Allergies  Allergen Reactions  . Tylenol Arthritis Ext [Acetaminophen] Other (See Comments)    Does not help     PERTINENT MEDICATIONS:  Outpatient Encounter Medications as of 09/28/2020  Medication Sig  . acetaminophen (TYLENOL) 500 MG tablet Take 1,000 mg by mouth every 8 (eight) hours as needed for mild pain or moderate pain.  [name] ADVAIR DISKUS 500-50 MCG/DOSE AEPB Inhale 1 puff into the lungs 2 (two) times daily.  [name] albuterol (VENTOLIN HFA) 108 (90 Base) MCG/ACT inhaler Inhale 1-2 puffs into the lungs 2 (two) times daily as needed for shortness of breath.   . Clobetasol Prop Emollient Base 0.05 % emollient cream Apply 1 application topically daily as needed (eczema).   . diclofenac sodium (VOLTAREN) 1 % GEL Apply 2 g topically 2 (two) times daily.  [name] diltiazem (CARDIZEM CD) 120 MG 24 hr capsule Take 1 capsule (120 mg total) by mouth daily.  [name] ELIQUIS 5 MG TABS tablet TAKE 1 TABLET BY MOUTH TWICE A DAY  . furosemide (LASIX) 40 MG tablet Take 40 mg by mouth 2 (two) times daily.   [name] levalbuterol (XOPENEX) 1.25 MG/0.5ML nebulizer solution Take 0.63 mg by nebulization every 6 (six) hours as needed for wheezing or shortness of breath.  . loratadine (CLARITIN) 10 MG tablet Take 10 mg by mouth daily as needed for allergies.  [name] LORazepam (ATIVAN) 0.5 MG tablet Take 0.5 tablets (0.25 mg total) by mouth 2 (two) times daily as needed for anxiety or sleep.  . metFORMIN (GLUCOPHAGE) 1000 MG tablet Take 500-1,000 mg by mouth See admin instructions. Take 1000 mg in the morning and 500 mg in the evening  . metoprolol tartrate (LOPRESSOR) 100 MG tablet Take 1 tablet (100 mg total) by mouth 2 (two) times daily.  [name] nystatin cream (MYCOSTATIN) Apply 1 application topically daily as needed (yeast under breast).    No facility-administered encounter medications on file as of 09/28/2020.     PHYSICAL EXAM/ROS:  General: NAD, cooperative Cardiovascular: regular rate and rhythm Pulmonary: clear ant/post fields, no shortness of breath Abdomen: soft, nontender, + bowel sounds GU: no suprapubic tenderness Extremities: no edema, no joint deformities Skin: no rashes to visible skin Neurological: Weakness but otherwise nonfocal a&o x 4   Teodoro Spray, NP

## 2020-10-05 ENCOUNTER — Other Ambulatory Visit: Payer: Self-pay | Admitting: Radiology

## 2020-10-18 ENCOUNTER — Ambulatory Visit (HOSPITAL_BASED_OUTPATIENT_CLINIC_OR_DEPARTMENT_OTHER): Payer: 59 | Attending: Pulmonary Disease | Admitting: Internal Medicine

## 2020-10-18 ENCOUNTER — Other Ambulatory Visit: Payer: Self-pay

## 2020-10-18 DIAGNOSIS — G4733 Obstructive sleep apnea (adult) (pediatric): Secondary | ICD-10-CM | POA: Insufficient documentation

## 2020-10-25 ENCOUNTER — Telehealth: Payer: Self-pay | Admitting: Pulmonary Disease

## 2020-10-25 DIAGNOSIS — G4733 Obstructive sleep apnea (adult) (pediatric): Secondary | ICD-10-CM

## 2020-10-25 NOTE — Procedures (Signed)
Patient Name: Amy Riddle, Amy Riddle Date: 10/18/2020 Gender: Female D.O.B: 25-Jan-1946 Age (years): 57 Referring Provider: Kara Mead MD, ABSM Height (inches): 62 Interpreting Physician: Kara Mead MD, ABSM Weight (lbs): 243 RPSGT: Carolin Coy BMI: 44 MRN: [id number] Neck Size: 16.50 <br> <br> CLINICAL INFORMATION The patient is referred for a BiPAP titration to treat sleep apnea.    Date of NPSG: 08/2020, AHI 19/h, 2 L O2 applied  SLEEP STUDY TECHNIQUE As per the AASM Manual for the Scoring of Sleep and Associated Events v2.3 (April 2016) with a hypopnea requiring 4% desaturations.  The channels recorded and monitored were frontal, central and occipital EEG, electrooculogram (EOG), submentalis EMG (chin), nasal and oral airflow, thoracic and abdominal wall motion, anterior tibialis EMG, snore microphone, electrocardiogram, and pulse oximetry. Bilevel positive airway pressure (BPAP) was initiated at the beginning of the study and titrated to treat sleep-disordered breathing.  MEDICATIONS Medications self-administered by patient taken the night of the study : COLCHICINE  RESPIRATORY PARAMETERS Optimal IPAP Pressure (cm): 20 AHI at Optimal Pressure (/hr) 3.7 Optimal EPAP Pressure (cm): 16   Overall Minimal O2 (%): 79.0 Minimal O2 at Optimal Pressure (%): 90.0 SLEEP ARCHITECTURE Start Time: 10:50:58 PM Stop Time: 4:54:23 AM Total Time (min): 363.4 Total Sleep Time (min): 159.5 Sleep Latency (min): 84.2 Sleep Efficiency (%): 43.9% REM Latency (min): 189.0 WASO (min): 119.7 Stage N1 (%): 12.5% Stage N2 (%): 79.9% Stage N3 (%): 0.0% Stage R (%): 7.5 Supine (%): 19.75 Arousal Index (/hr): 47.8     CARDIAC DATA The 2 lead EKG demonstrated atrial fibrillation. The mean heart rate was 70.6 beats per minute. Other EKG findings include: PVCs. LEG MOVEMENT DATA The total Periodic Limb Movements of Sleep (PLMS) were 0. The PLMS index was 0.0. A PLMS index of <15 is considered  normal in adults.  IMPRESSIONS - An optimal biPAP pressure was selected for this patient ( 20 / 16cm of water) - Central sleep apnea was not noted during this titration (CAI = 0.0/h). - Severe oxygen desaturations were observed during this titration (min O2 = 79.0%). - The patient snored with soft snoring volume. - 2-lead EKG demonstrated: PVCs - Clinically significant periodic limb movements were not noted during this study. Arousals associated with PLMs were rare.   DIAGNOSIS - Obstructive Sleep Apnea (G47.33)   RECOMMENDATIONS - Trial of BiPAP therapy on 20/16 cm H2O with a Medium size Resmed Full Face Mask AirFit F20 mask and heated humidification. - Avoid alcohol, sedatives and other CNS depressants that may worsen sleep apnea and disrupt normal sleep architecture. - Sleep hygiene should be reviewed to assess factors that may improve sleep quality. - Weight management and regular exercise should be initiated or continued. - Return to Sleep Center for re-evaluation after 4 weeks of therapy   Kara Mead MD Board Certified in [place]

## 2020-10-25 NOTE — Telephone Encounter (Signed)
Please send order to DME for Auto BiPAP, EPAP minimum 10 cm, pressure support +4 cm, IPAP maximum 20 cm Medium full facemask  Office visit with me in 6-8 weeks

## 2020-10-31 ENCOUNTER — Other Ambulatory Visit: Payer: Self-pay

## 2020-10-31 ENCOUNTER — Telehealth: Payer: Self-pay | Admitting: *Deleted

## 2020-10-31 ENCOUNTER — Ambulatory Visit: Payer: 59 | Admitting: Pulmonary Disease

## 2020-10-31 ENCOUNTER — Encounter: Payer: Self-pay | Admitting: Pulmonary Disease

## 2020-10-31 VITALS — BP 120/90 | HR 97 | Temp 97.2°F | Ht 62.5 in | Wt 244.6 lb

## 2020-10-31 DIAGNOSIS — J9612 Chronic respiratory failure with hypercapnia: Secondary | ICD-10-CM

## 2020-10-31 DIAGNOSIS — J45998 Other asthma: Secondary | ICD-10-CM | POA: Diagnosis not present

## 2020-10-31 DIAGNOSIS — J209 Acute bronchitis, unspecified: Secondary | ICD-10-CM

## 2020-10-31 DIAGNOSIS — J9611 Chronic respiratory failure with hypoxia: Secondary | ICD-10-CM | POA: Diagnosis not present

## 2020-10-31 MED ORDER — AZITHROMYCIN 250 MG PO TABS
ORAL_TABLET | ORAL | 0 refills | Status: DC
Start: 2020-10-31 — End: 2021-02-09

## 2020-10-31 NOTE — Telephone Encounter (Signed)
Patient uses [facility] for BIPAP. Phone number is [phone]. Fax number is [phone]. Patient phone number is [phone]. May leave detailed message on voicemail.

## 2020-10-31 NOTE — Assessment & Plan Note (Signed)
Reviewed sleep study results.  Events are well controlled with BiPAP. We will discontinue trilogy machine and set her up with BiPAP for obesity hypoventilation.  We will have oxygen blended into the BiPAP machine. Auto BiPAP settings have been ordered, compliance encouraged  Weight loss encouraged, compliance with goal of at least 4-6 hrs every night is the expectation. Advised against medications with sedative side effects Cautioned against driving when sleepy - understanding that sleepiness will vary on a day to day basis

## 2020-10-31 NOTE — Telephone Encounter (Signed)
Patient was in office for a face to face visit with Dr. Elsworth Soho, orders placed per Dr. Elsworth Soho.  Willeen Cass RN called and spoke with patient.  Patient called the office and advised [place] that she uses [facility] for her Bipap.  Nothing further needed.

## 2020-10-31 NOTE — Telephone Encounter (Signed)
Pt. Unsure of DME company she uses for her Trelegy vent.  Patient instructed to call us back to let us know so we can document it in her chart and so we can send orders from her visit today.  Will await her return call.

## 2020-10-31 NOTE — Addendum Note (Signed)
Addended by: Vanessa Barbara on: 10/31/2020 10:19 AM   Modules accepted: Orders

## 2020-10-31 NOTE — Patient Instructions (Signed)
Zpak  We will get you a new biPAP machine DC trilogy Continue using oxygen during sleep

## 2020-10-31 NOTE — Assessment & Plan Note (Signed)
Continue Advair for now PFTs in the future

## 2020-10-31 NOTE — Progress Notes (Signed)
   Subjective:    Patient ID: Amy Riddle, female    DOB: 06/30/1946, 74 y.o.   MRN: [id number]  HPI  74 yo  Remote smoker with chronic respiratory failure, discharged on 3 L oxygen. CODE STATUS DNR  PMH - A-Fib - on eliquis, cardizem. Has required cardioversion  HTN - on losartan, lasix. Prior LVEF 40-45% Asthma - on advair  DM II - on metformin  Morbid Obesity  Former Smoker - quit 1975  She was hospitalized 6/8-6/18/21, presented with shortness of breath, hypoxia and bilateral lower extremity edema, with mild leukocytosis 13.1 and AKI with creatinine elevated to 2.7 Chest x-ray showed right pleural effusion with cardiomegaly and vascular congestion  ABG showed hypercarbia 7.2 5/82/74 on 60% FiO2 Echo showed EF 40 to 45% She was treated withLasix, bronchodilators, Pulmicort and Brovana and also empiric ceftriaxone and azithromycin.  BiPAP was used and transitioned to nightly she was discharged on 3 L of oxygen and trilogy  Weight on discharge was 249 pounds, weight on admission was 285 pounds.  She has done well since discharge, uses trilogy machine from 7 PM until bedtime and then sleeps only with oxygen.  Compliant with Lasix, she had an attack of gout and this is now resolved. Compliant with Advair sometimes forgets  She has URI symptoms, no fever, started as a head cold now into her chest, coughing, clear phlegm mixed with yellow occasionally, husband was sick she is vaccinated  We reviewed sleep studies today  She is followed by outpatient palliative care for some reason, reviewed their note  Significant tests/ events reviewed   Limited ECHO 6/9 >> LVEF ~ 40-45%, LV mildly decreased function, LV demonstrates global hypokinesis, interventricular septum flattened in systole consistent with RV pressure overload, RV systolic function moderately reduced, RV is severely enlarged, LA mildly dilated, RA severely dilated  Renal US 6/9 >> no mass or  hydronephrosis, small amount left-sided perinephric fluid V/Q scan 6/11 >> low probability for PE  NPSG 08/2020 mod OSA AHI 19/h with severe desatn , nadir 66%, required 2L O2  Titration 10/2020 >> BiPAP 20/16 , nadir 79%  Review of Systems neg for any significant sore throat, dysphagia, itching, sneezing, nasal congestion or excess/ purulent secretions, fever, chills, sweats, unintended wt loss, pleuritic or exertional cp, hempoptysis, orthopnea pnd or change in chronic leg swelling. Also denies presyncope, palpitations, heartburn, abdominal pain, nausea, vomiting, diarrhea or change in bowel or urinary habits, dysuria,hematuria, rash, arthralgias, visual complaints, headache, numbness weakness or ataxia.     Objective:   Physical Exam  Gen. Pleasant, obese, in no distress ENT - no lesions, no post nasal drip Neck: No JVD, no thyromegaly, no carotid bruits Lungs: no use of accessory muscles, no dullness to percussion, decreased without rales or rhonchi  Cardiovascular: Rhythm regular, heart sounds  normal, no murmurs or gallops, no peripheral edema Musculoskeletal: No deformities, no cyanosis or clubbing , no tremors       Assessment & Plan:    Acute bronchitis -Z-Pak

## 2020-10-31 NOTE — Addendum Note (Signed)
Addended by: Vanessa Barbara on: 10/31/2020 05:10 PM   Modules accepted: Orders

## 2020-11-01 NOTE — Telephone Encounter (Signed)
Orders sent to [facility] on 10/31/2020, patient called and made aware.  Nothing further needed.

## 2020-11-07 ENCOUNTER — Other Ambulatory Visit: Payer: Self-pay

## 2020-11-07 ENCOUNTER — Other Ambulatory Visit: Payer: 59 | Admitting: Hospice

## 2020-11-07 DIAGNOSIS — Z515 Encounter for palliative care: Secondary | ICD-10-CM

## 2020-11-07 NOTE — Progress Notes (Signed)
[facility] Consult Note Telephone: [phone]  Fax: [phone]  PATIENT NAME: Amy Riddle DOB: 12/03/1946 MRN: [id number]  PRIMARY CARE PROVIDER:   Leonard Downing, MD Leonard Downing, MD [address] [place],  [place] [address]  REFERRING PROVIDER: Leonard Downing, MD Leonard Downing, MD [place],  [place] [address]  RESPONSIBLE PARTY:Lybbert,Donald L (Spouse)  [phone] [facility] Phone) Brunetta Genera [phone]    RECOMMENDATIONS/PLAN:   Visit consisted of building trust and follow up on palliative care.  Code Status/Advance Care Planning:CODE STATUS reviewed. Patient is a DO NOT RESUSCITATE.   Goals of Care: Goals of care include to maximize quality of life and symptom management.Patient wishes to lose 40 more pounds current weight is 244 pounds down from 315 pounds 1 year ago per patient.  Visit consisted of counseling and education dealing with the complex and emotionally intense issues of symptom management and palliative care in the setting of serious and potentially life-threatening illness.  Had [place] faith, crocheting and making christmas bags for family members are sources of joy and strength for patient.  Follow JF:[id number] care will continue to follow patient for goals of care clarification and symptom management.Follow-up 2 months/as needed.  Functional status/Symptom management: Sleep Apnea: New Bipap machine for moderate sleep apnea; expecting a technician to demonstrate use. Recent sleep study shows moderate sleep apnea.  Patient reports blood pressure well managed. Continue Cardizem 120 mg daily as recently adjusted by Cardiologist.  Shortness of breath: Recently treated with Z pack for bronchitis. She has oxygen concentrator at home.  She says she only uses oxygen at night for 4 hours of BiPAP. She denies shortness of breath and  says she feels a lot better after her completion of Zpack.  Type 2 diabetes mellitus currently well managed with Metformin, SSI. Current  A1c is 5.9. Continue Metformin as ordered, continue lifestyle modifications- she reads food labels and is very particular about avoiding high sodium foods. NP provided validation and reiterated the need to continue with ongoing lifestyle modifications. Current weight is 244 pounds down from 315 pounds.She said her target is to lose 40 more pounds.   Family /Caregiver/Community Supports:Patient lives at home with spouse who is involved in her care. Strong family support system identified.  I spent70minutes providing this consultation; time includes time spent with patient/family, chart review, and documentation. More than 50% of the time in this consultation was spent on coordinating communication  HISTORY OF PRESENT ILLNESS:Amy F Campbellis a 74 y.o. femalewith medical history significant for asthma with excerbations, HTN, permanent atrial fibrillation (Eliquis/Cardizem), Type 2 DM (oral meds and SSI), morbid obesity.  CODE STATUS:DNR  PPS:70%  HOSPICE ELIGIBILITY/DIAGNOSIS: TBD  PAST MEDICAL HISTORY:  Past Medical History:  Diagnosis Date  . Arthritis   . Asthma   . Diabetes mellitus without complication (HCC)    TYPE II no meds  . Eczema   . Fibroid   . Hypertension   . Osteoarthritis   . Ovarian neoplasm    PRODUCING TESTOSTERONE  . Sleep apnea    BiPAP with O2  . Submucous myoma of uterus     SOCIAL HX:  Social History   Tobacco Use  . Smoking status: Former Smoker    Quit date: 03/26/1974    Years since quitting: 46.6  . Smokeless tobacco: Never Used  Substance Use Topics  . Alcohol use: No    ALLERGIES:  Allergies  Allergen Reactions  .  Tylenol Arthritis Ext [Acetaminophen] Other (See Comments)    Does not help     PERTINENT MEDICATIONS:  Outpatient Encounter Medications as of 11/07/2020    Medication Sig  . acetaminophen (TYLENOL) 500 MG tablet Take 1,000 mg by mouth every 8 (eight) hours as needed for mild pain or moderate pain.  [name] ADVAIR DISKUS 500-50 MCG/DOSE AEPB Inhale 1 puff into the lungs 2 (two) times daily.  [name] albuterol (VENTOLIN HFA) 108 (90 Base) MCG/ACT inhaler Inhale 1-2 puffs into the lungs 2 (two) times daily as needed for shortness of breath.   [name] azithromycin (ZITHROMAX) 250 MG tablet Take 2 tablets today and then 1 tablet daily until gone.  . Clobetasol Prop Emollient Base 0.05 % emollient cream Apply 1 application topically daily as needed (eczema).   . colchicine 0.6 MG tablet Take 0.6 mg by mouth 2 (two) times daily.  . diclofenac sodium (VOLTAREN) 1 % GEL Apply 2 g topically 2 (two) times daily.  [name] diltiazem (CARDIZEM CD) 120 MG 24 hr capsule Take 1 capsule (120 mg total) by mouth daily.  [name] ELIQUIS 5 MG TABS tablet TAKE 1 TABLET BY MOUTH TWICE A DAY  . furosemide (LASIX) 40 MG tablet Take 40 mg by mouth 2 (two) times daily.   [name] levalbuterol (XOPENEX) 1.25 MG/0.5ML nebulizer solution Take 0.63 mg by nebulization every 6 (six) hours as needed for wheezing or shortness of breath.  . loratadine (CLARITIN) 10 MG tablet Take 10 mg by mouth daily as needed for allergies.  [name] LORazepam (ATIVAN) 0.5 MG tablet Take 0.5 tablets (0.25 mg total) by mouth 2 (two) times daily as needed for anxiety or sleep.  . metFORMIN (GLUCOPHAGE) 1000 MG tablet Take 500-1,000 mg by mouth See admin instructions. Take 1000 mg in the morning and 500 mg in the evening  . metoprolol tartrate (LOPRESSOR) 100 MG tablet Take 1 tablet (100 mg total) by mouth 2 (two) times daily.  [name] nystatin cream (MYCOSTATIN) Apply 1 application topically daily as needed (yeast under breast).    No facility-administered encounter medications on file as of 11/07/2020.    PHYSICAL EXAM/ROS:  General: NAD, cooperative Cardiovascular: regular rate and rhythm Pulmonary: clear ant/post fields, no shortness of  breath, normal respiratory effort Abdomen: soft, nontender, + bowel sounds, no constipation GU: no suprapubic tenderness Extremities: no edema, no joint deformities Skin: no rashes to visible skin Neurological: Weakness but otherwise nonfocal a&o x 4   Note:  Portions of this note were generated with [employer]. Dictation errors may occur despite attempts at proofreading.  Teodoro Spray, NP

## 2020-11-15 ENCOUNTER — Other Ambulatory Visit: Payer: Self-pay | Admitting: Cardiology

## 2021-01-25 ENCOUNTER — Other Ambulatory Visit: Payer: Self-pay | Admitting: Adult Health

## 2021-02-05 DIAGNOSIS — I5042 Chronic combined systolic (congestive) and diastolic (congestive) heart failure: Secondary | ICD-10-CM | POA: Insufficient documentation

## 2021-02-05 NOTE — Progress Notes (Unsigned)
Cardiology Office Note   Date:  02/06/2021   ID:  Amy Riddle, Amy Riddle 1946-03-26, MRN [id number]  PCP:  Leonard Downing, MD  Cardiologist:   Minus Breeding, MD    Chief Complaint  Patient presents with  . Atrial Fibrillation      History of Present Illness: Amy Riddle is a 75 y.o. female who presents for follow up of SOB and atrial fib.  She was in the hospital in June 2021 with respiratory distress.   She was treated for multifactorial respiratory failure.  She had sleep apnea and hypoventilation obesity syndrome.  She was also thought to have acute systolic and diastolic HF.    Since I last saw her she has done well.  Her weight is up a little bit.  She has been trying but her weight fluctuates.  She says her breathing is better than it used to be. The patient denies any new symptoms such as chest discomfort, neck or arm discomfort. There has been no new shortness of breath, PND or orthopnea. There have been no reported palpitations, presyncope or syncope.     Past Medical History:  Diagnosis Date  . Arthritis   . Asthma   . Diabetes mellitus without complication (HCC)    TYPE II no meds  . Eczema   . Fibroid   . Hypertension   . Osteoarthritis   . Ovarian neoplasm    PRODUCING TESTOSTERONE  . Sleep apnea    BiPAP with O2  . Submucous myoma of uterus     Past Surgical History:  Procedure Laterality Date  . CARDIOVERSION N/A 10/05/2019   Procedure: CARDIOVERSION;  Surgeon: Fay Records, MD;  Location: [facility];  Service: Cardiovascular;  Laterality: N/A;  . CARPAL TUNNEL RELEASE Left   . CARPAL TUNNEL RELEASE Left 03/23/2015   Procedure: LEFT CARPAL TUNNEL RELEASE;  Surgeon: Leandrew Koyanagi, MD;  Location: [facility];  Service: Orthopedics;  Laterality: Left;  . DERMOID CYST  EXCISION    . HERNIA REPAIR     X2  . HYSTEROSCOPY     D & C  . PELVIC LAPAROSCOPY       Current Outpatient Medications  Medication Sig Dispense  Refill  . acetaminophen (TYLENOL) 500 MG tablet Take 1,000 mg by mouth every 8 (eight) hours as needed for mild pain or moderate pain.    [name] ADVAIR DISKUS 500-50 MCG/DOSE AEPB Inhale 1 puff into the lungs 2 (two) times daily.    [name] albuterol (VENTOLIN HFA) 108 (90 Base) MCG/ACT inhaler Inhale 1-2 puffs into the lungs 2 (two) times daily as needed for shortness of breath.     [name] azithromycin (ZITHROMAX) 250 MG tablet Take 2 tablets today and then 1 tablet daily until gone. 6 tablet 0  . Clobetasol Prop Emollient Base 0.05 % emollient cream Apply 1 application topically daily as needed (eczema).     . colchicine 0.6 MG tablet Take 0.6 mg by mouth 2 (two) times daily.    . diclofenac sodium (VOLTAREN) 1 % GEL Apply 2 g topically 2 (two) times daily.    [name] diltiazem (CARDIZEM CD) 120 MG 24 hr capsule TAKE 1 CAPSULE BY MOUTH EVERY DAY 90 capsule 1  . ELIQUIS 5 MG TABS tablet TAKE 1 TABLET BY MOUTH TWICE A DAY 180 tablet 1  . furosemide (LASIX) 40 MG tablet Take 40 mg by mouth 2 (two) times daily.     [name] levalbuterol (XOPENEX) 1.25 MG/0.5ML  nebulizer solution Take 0.63 mg by nebulization every 6 (six) hours as needed for wheezing or shortness of breath. 1 each 12  . levalbuterol (XOPENEX) 1.25 MG/3ML nebulizer solution SMARTSIG:0.5 Vial(s) Via Nebulizer Every 6 Hours PRN    . loratadine (CLARITIN) 10 MG tablet Take 10 mg by mouth daily as needed for allergies.    [name] LORazepam (ATIVAN) 0.5 MG tablet Take 0.5 tablets (0.25 mg total) by mouth 2 (two) times daily as needed for anxiety or sleep. 30 tablet 0  . metFORMIN (GLUCOPHAGE) 1000 MG tablet Take 500-1,000 mg by mouth See admin instructions. Take 1000 mg in the morning and 500 mg in the evening    . metoprolol tartrate (LOPRESSOR) 100 MG tablet Take 1 tablet (100 mg total) by mouth 2 (two) times daily. 180 tablet 3  . nystatin cream (MYCOSTATIN) Apply 1 application topically daily as needed (yeast under breast).      No current facility-administered  medications for this visit.    Allergies:   Tylenol arthritis ext [acetaminophen]    ROS:  Please see the history of present illness.   Otherwise, review of systems are positive for none.   All other systems are reviewed and negative.    PHYSICAL EXAM: VS:  BP 128/90   Pulse 88   Ht 5\' 2"  (1.575 m)   Wt 252 lb 12.8 oz (114.7 kg)   SpO2 99%   BMI 46.24 kg/m  , BMI Body mass index is 46.24 kg/m. GENERAL:  Well appearing NECK:  No jugular venous distention, waveform within normal limits, carotid upstroke brisk and symmetric, no bruits, no thyromegaly LUNGS:  Clear to auscultation bilaterally CHEST:  Unremarkable HEART:  PMI not displaced or sustained,S1 and S2 within normal limits, no S3, no clicks, no rubs, no murmurs, irregular ABD:  Flat, positive bowel sounds normal in frequency in pitch, no bruits, no rebound, no guarding, no midline pulsatile mass, no hepatomegaly, no splenomegaly EXT:  2 plus pulses throughout, no edema, no cyanosis no clubbing   EKG:  EKG is  ordered today. Atrial fibrillation, rate 88, axis within normal limits, intervals within normal limits, low voltage in the limb in chest leads.   Recent Labs: 05/10/2020: ALT 36 05/12/2020: Hemoglobin 15.2; Platelets 249 05/16/2020: Magnesium 2.3 05/18/2020: B Natriuretic Peptide 451.5 07/08/2020: BUN 28; Creatinine, Ser 1.05; Potassium 4.6; Sodium 145    Lipid Panel No results found for: CHOL, TRIG, HDL, CHOLHDL, VLDL, LDLCALC, LDLDIRECT    Wt Readings from Last 3 Encounters:  02/06/21 252 lb 12.8 oz (114.7 kg)  10/31/20 244 lb 9.6 oz (110.9 kg)  10/18/20 243 lb (110.2 kg)      Other studies Reviewed: Additional studies/ records that were reviewed today include:  None . Review of the above records demonstrates:  NA   ASSESSMENT AND PLAN:  CHRONIC SYSTOLIC AND DIASTOLIC HF:      Her ejection fraction has been slightly reduced.  The plan had been to get her in sinus rhythm and then follow-up.  I do have her  rate controlled but we decided it would be great management and anticoagulation.  I am going to check another echocardiogram in May.  If her ejection fraction is reduced I will likely screen her to make sure that she has not had ischemia by ordering a Lexiscan Myoview.   I would then begin to titrate ARB/ARNI.   CHRONIC ATRIAL FIB: She tolerates anticoagulation.   Amy Riddle has a CHA2DS2 - VASc score of 4.  SLEEP APNEA:    She wears BiPAP.  She has a new mask that works well  MORBID OBESITY: I am going to have her work with our Musician with the specific issue of wanting her to address the details of her diet to begin to lose 3 pounds a month likely through reduced carbohydrates   Current medicines are reviewed at length with the patient today.  The patient does not have concerns regarding medicines.  The following changes have been made: None  Labs/ tests ordered today include:      Orders Placed This Encounter  Procedures  . EKG 12-Lead  . ECHOCARDIOGRAM COMPLETE     Disposition:   FU with me in June.    Signed, Minus Breeding, MD  02/06/2021 9:42 AM     [facility]

## 2021-02-06 ENCOUNTER — Encounter: Payer: Self-pay | Admitting: Cardiology

## 2021-02-06 ENCOUNTER — Ambulatory Visit: Payer: 59 | Admitting: Cardiology

## 2021-02-06 ENCOUNTER — Other Ambulatory Visit: Payer: Self-pay

## 2021-02-06 VITALS — BP 128/90 | HR 88 | Ht 62.0 in | Wt 252.8 lb

## 2021-02-06 DIAGNOSIS — I482 Chronic atrial fibrillation, unspecified: Secondary | ICD-10-CM

## 2021-02-06 DIAGNOSIS — G4730 Sleep apnea, unspecified: Secondary | ICD-10-CM | POA: Diagnosis not present

## 2021-02-06 DIAGNOSIS — I429 Cardiomyopathy, unspecified: Secondary | ICD-10-CM | POA: Diagnosis not present

## 2021-02-06 DIAGNOSIS — I5042 Chronic combined systolic (congestive) and diastolic (congestive) heart failure: Secondary | ICD-10-CM | POA: Diagnosis not present

## 2021-02-06 NOTE — Patient Instructions (Signed)
Medication Instructions:  No changes *If you need a refill on your cardiac medications before your next appointment, please call your pharmacy*  Testing/Procedures: Your physician has requested that you have an echocardiogram. Echocardiography is a painless test that uses sound waves to create images of your heart. It provides your doctor with information about the size and shape of your heart and how well your heart's chambers and valves are working. This procedure takes approximately one hour. There are no restrictions for this procedure. [address]  Follow-Up: At [facility], you and your health needs are our priority.  As part of our continuing mission to provide you with exceptional heart care, we have created designated Provider Care Teams.  These Care Teams include your primary Cardiologist (physician) and Advanced Practice Providers (APPs -  Physician Assistants and Nurse Practitioners) who all work together to provide you with the care you need, when you need it.  Your next appointment:   Thursday May 11, 2021 @ 09:00am  The format for your next appointment:   In Person  Provider:   Minus Breeding, MD  Other Instructions Referred to health coach / care guide

## 2021-02-08 ENCOUNTER — Other Ambulatory Visit: Payer: Self-pay

## 2021-02-08 ENCOUNTER — Other Ambulatory Visit: Payer: 59 | Admitting: Hospice

## 2021-02-08 DIAGNOSIS — M25562 Pain in left knee: Secondary | ICD-10-CM

## 2021-02-08 DIAGNOSIS — Z515 Encounter for palliative care: Secondary | ICD-10-CM

## 2021-02-08 NOTE — Progress Notes (Signed)
[facility] Consult Note Telephone: [phone]  Fax: [phone]  PATIENT NAME: Amy Riddle DOB: 19-Jul-1946 MRN: [id number]  PRIMARY CARE PROVIDER:   Leonard Downing, MD Leonard Downing, MD [address] [place],  [place] [address]  REFERRING PROVIDER: Leonard Downing, MD Leonard Downing, MD [address] [place],  [place] [address]  RESPONSIBLE PARTY:Herman,Donald L (Spouse)  [phone] [facility] Phone) Brunetta Genera [phone]   TELEHEALTH VISIT STATEMENT Due to the COVID-19 crisis, this visit was done via telemedicine and it was initiated and consent by this patient and or family. Video-audio (telehealth) contact was unable to be done due to technical barriers from the patient's side.  Visit is to build trust and highlight Palliative Medicine as specialized medical care for people living with serious illness, aimed at facilitating better quality of life through symptoms relief, assisting with advance care plan and establishing goals of care.   CHIEF COMPLAINT: Palliative focused visit/left knee pain  RECOMMENDATIONS/PLAN:   1. Advance Care Planning/Code Status: Patient is a DO NOT RESUSCITATE  2. Goals of Care: Goals of care include to maximize quality of life and symptom management.  Patient reports her goal is realistic and does not expect miracles given her advancing age.  Has spirituality helps her to cope. Visit consisted of counseling and education dealing with the complex and emotionally intense issues of symptom management and palliative care in the setting of serious and potentially life-threatening illness. Palliative care team will continue to support patient, patient's family, and medical team. I spent 16 minutes providing this consultation.  More than 50% of the time was spent on counseling and facilitating  communicatio ------------------------------------------------------------------------------------------------------------------------------------ . 3. Symptom management/Plan:  Left knee pain: Tylenol 1000 mg twice daily as needed for pain.  Continue Voltaren gel as currently ordered.  Heat/cold compress as needed.  Exercise as tolerated.  Balance of rest and performance activity. Patient reports appointment end of March 2022 with cardiologist for echocardiogram; plan visit with lung specialist tomorrow. Palliative will continue to monitor for symptom management/decline and make recommendations as needed. Return 2 months or prn. Encouraged to call provider sooner with any concerns.   HISTORY OF PRESENT ILLNESS:  Amy Riddle is a 75 y.o. female with multiple medical problems including acute on chronic left knee pain likely related to arthritis.  Aching pain to left knee is intermittent, worse in the morning when she tries to walk, helped with Tylenol.  Pain impairs her independence and activities of daily living.  History of type 2 diabetes, morbid obesity, A. fib, hypertension, asthma. History obtained from review of EMR, discussion with patient/family.   Review and summarization of Epic records shows history from other than patient. Rest of 10 point ROS asked and negative.  Palliative Care was asked to follow this patient by consultation request of Leonard Downing, * to help address complex decision making in the context of advance care planning and goals of care clarification.   CODE STATUS: DNR  PPS: 60%  HOSPICE ELIGIBILITY/DIAGNOSIS: TBD  PAST MEDICAL HISTORY:  Past Medical History:  Diagnosis Date  . Arthritis   . Asthma   . Diabetes mellitus without complication (HCC)    TYPE II no meds  . Eczema   . Fibroid   . Hypertension   . Osteoarthritis   . Ovarian neoplasm    PRODUCING TESTOSTERONE  . Sleep apnea    BiPAP with O2  . Submucous myoma of uterus  SOCIAL HX: @SOCX  Patient at home for ongoing care   FAMILY HX:  Family History  Problem Relation Age of Onset  . Heart disease Mother   . Cancer Father        PANCREATIC  . Hypertension Brother   . Breast cancer Maternal Aunt   . Hypertension Maternal Grandmother   . Heart disease Maternal Grandfather     Review lab tests/diagnostics No results for input(s): WBC, HGB, HCT, PLT, MCV in the last 168 hours. No results for input(s): NA, K, CL, CO2, BUN, CREATININE, GLUCOSE in the last 168 hours. Latest GFR by Cockcroft Gault (not valid in AKI or ESRD) CrCl cannot be calculated (Patient's most recent lab result is older than the maximum 21 days allowed.). No results for input(s): AST, ALT, ALKPHOS, GGT in the last 168 hours.  Invalid input(s): TBILI, CONJBILI, ALB, TOTALPROTEIN No components found for: ALB No results for input(s): APTT, INR in the last 168 hours.  Invalid input(s): PTPATIENT No results for input(s): BNP, PROBNP in the last 168 hours.  ALLERGIES:  Allergies  Allergen Reactions  . Tylenol Arthritis Ext [Acetaminophen] Other (See Comments)    Does not help      PERTINENT MEDICATIONS:  Outpatient Encounter Medications as of 02/08/2021  Medication Sig  . acetaminophen (TYLENOL) 500 MG tablet Take 1,000 mg by mouth every 8 (eight) hours as needed for mild pain or moderate pain.  [name] ADVAIR DISKUS 500-50 MCG/DOSE AEPB Inhale 1 puff into the lungs 2 (two) times daily.  [name] albuterol (VENTOLIN HFA) 108 (90 Base) MCG/ACT inhaler Inhale 1-2 puffs into the lungs 2 (two) times daily as needed for shortness of breath.   [name] azithromycin (ZITHROMAX) 250 MG tablet Take 2 tablets today and then 1 tablet daily until gone.  . Clobetasol Prop Emollient Base 0.05 % emollient cream Apply 1 application topically daily as needed (eczema).   . colchicine 0.6 MG tablet Take 0.6 mg by mouth 2 (two) times daily.  . diclofenac sodium (VOLTAREN) 1 % GEL Apply 2 g topically 2 (two) times daily.   [name] diltiazem (CARDIZEM CD) 120 MG 24 hr capsule TAKE 1 CAPSULE BY MOUTH EVERY DAY  . ELIQUIS 5 MG TABS tablet TAKE 1 TABLET BY MOUTH TWICE A DAY  . furosemide (LASIX) 40 MG tablet Take 40 mg by mouth 2 (two) times daily.   [name] levalbuterol (XOPENEX) 1.25 MG/0.5ML nebulizer solution Take 0.63 mg by nebulization every 6 (six) hours as needed for wheezing or shortness of breath.  . levalbuterol (XOPENEX) 1.25 MG/3ML nebulizer solution SMARTSIG:0.5 Vial(s) Via Nebulizer Every 6 Hours PRN  . loratadine (CLARITIN) 10 MG tablet Take 10 mg by mouth daily as needed for allergies.  [name] LORazepam (ATIVAN) 0.5 MG tablet Take 0.5 tablets (0.25 mg total) by mouth 2 (two) times daily as needed for anxiety or sleep.  . metFORMIN (GLUCOPHAGE) 1000 MG tablet Take 500-1,000 mg by mouth See admin instructions. Take 1000 mg in the morning and 500 mg in the evening  . metoprolol tartrate (LOPRESSOR) 100 MG tablet Take 1 tablet (100 mg total) by mouth 2 (two) times daily.  [name] nystatin cream (MYCOSTATIN) Apply 1 application topically daily as needed (yeast under breast).    No facility-administered encounter medications on file as of 02/08/2021.    ROS  General: NAD EYES: denies vision changes ENMT: denies dysphagia no xerostomia Cardiovascular: denies chest pain Pulmonary: denies  cough, denies SOB  Abdomen: endorses fair appetite, no constipation or diarrhea GU: denies dysuria  or urinary frequency MSK:  endorses ROM limitations in left knee, no falls reported Skin: denies rashes or wounds Neurological: endorses weakness, denies pain, denies insomnia Psych: Endorses positive mood Heme/lymph/immuno: denies bruises, abnormal bleeding  Physical exam deferred due to telehealth medicine.  Thank you for the opportunity to participate in the care of JALEEYAH MUNCE Please call our office at [phone] if we can be of additional assistance.  Note: Portions of this note were generated with Theatre manager. Dictation errors may occur despite best attempts at proofreading.  Teodoro Spray, NP

## 2021-02-09 ENCOUNTER — Encounter: Payer: Self-pay | Admitting: Pulmonary Disease

## 2021-02-09 ENCOUNTER — Other Ambulatory Visit: Payer: Self-pay

## 2021-02-09 ENCOUNTER — Ambulatory Visit (INDEPENDENT_AMBULATORY_CARE_PROVIDER_SITE_OTHER): Payer: No Typology Code available for payment source | Admitting: Pulmonary Disease

## 2021-02-09 VITALS — BP 118/78 | HR 76 | Temp 97.8°F | Ht 62.0 in | Wt 251.8 lb

## 2021-02-09 DIAGNOSIS — J9611 Chronic respiratory failure with hypoxia: Secondary | ICD-10-CM

## 2021-02-09 DIAGNOSIS — G4733 Obstructive sleep apnea (adult) (pediatric): Secondary | ICD-10-CM

## 2021-02-09 DIAGNOSIS — J45998 Other asthma: Secondary | ICD-10-CM | POA: Diagnosis not present

## 2021-02-09 DIAGNOSIS — J9612 Chronic respiratory failure with hypercapnia: Secondary | ICD-10-CM | POA: Diagnosis not present

## 2021-02-09 NOTE — Assessment & Plan Note (Signed)
Continue Advair.  She is a remote smoker doubt COPD. Obtain PFTs in the future

## 2021-02-09 NOTE — Assessment & Plan Note (Signed)
She is very compliant on BiPAP and this is certainly helped improve her daytime somnolence and fatigue.  Pap abdominal was reviewed which shows excellent compliance large leak and average pressure 16/12 on auto BiPAP settings. Have asked her to trial CPAP mask liner to decrease leak and hopefully with using that she will not have to tighten the straps as much.  Weight loss encouraged, compliance with goal of at least 4-6 hrs every night is the expectation. Advised against medications with sedative side effects Cautioned against driving when sleepy - understanding that sleepiness will vary on a day to day basis

## 2021-02-09 NOTE — Assessment & Plan Note (Addendum)
We will repeat nocturnal oximetry on BiPAP/RA If no desaturations, can discontinue oxygen.  Note that no oxygen was required during titration study  Hypercarbia was likely on the basis of obesity hypoventilation and acute decompensated heart failure

## 2021-02-09 NOTE — Progress Notes (Signed)
   Subjective:    Patient ID: Amy Riddle, female    DOB: [birth year], 75 y.o.   MRN: [id number]  HPI  75 yo  Remote smoker with chronic respiratory failure,discharged on 3 L oxygen. CODE STATUS DNR  PMH -A-Fib - on eliquis, cardizem. s/p  cardioversion  HTN - on losartan, lasix. Prior LVEF 40-45% Asthma - on advair  DM II - on metformin  Morbid Obesity  Former Smoker - quit 1975  She was hospitalized 6/8-6/18/21, presented with shortness of breath, hypoxia and bilateral lower extremity edema,with mild leukocytosis 13.1 and AKI with creatinine elevated to2.7  ABG showed hypercarbia 7.2 5/82/74 on 60% FiO2 Echo showed EF 40 to 45% She was treated withLasix, bronchodilators, Pulmicort and Brovana and also empiric ceftriaxone and azithromycin. BiPAP was used and transitioned to nightly she was discharged on 3 L of oxygen and trilogy Switched to BiPAP 11/2020 after sleep studies  Weight on discharge was 249 pounds, weight on admission was 285 pounds. She is now back to 251 pounds, continues to take Lasix. She likes her BiPAP machine she is very compliant, this leaves pressure marks over her face.  She does have a leak on her machine. Overall breathing is doing well and she is more active  She remains on Advair and denies wheezing or coughing  Significant tests/ events reviewed   Limited ECHO 05/2020 >> LVEF ~ 40-45%, LV mildly decreased function, LV demonstrates global hypokinesis, interventricular septum flattened in systole consistent with RV pressure overload, RV systolic function moderately reduced, RV is severely enlarged, LA mildly dilated, RA severely dilated  Renal US 6/9 >> no mass or hydronephrosis, small amount left-sided perinephric fluid V/Q scan 6/11 >> low probability for PE  NPSG 08/2020 mod OSA AHI 19/h with severe desatn , nadir 66%, required 2L O2  Titration 10/2020 >> BiPAP 20/16 , nadir 79%  Review of Systems neg for any significant  sore throat, dysphagia, itching, sneezing, nasal congestion or excess/ purulent secretions, fever, chills, sweats, unintended wt loss, pleuritic or exertional cp, hempoptysis, orthopnea pnd or change in chronic leg swelling. Also denies presyncope, palpitations, heartburn, abdominal pain, nausea, vomiting, diarrhea or change in bowel or urinary habits, dysuria,hematuria, rash, arthralgias, visual complaints, headache, numbness weakness or ataxia.     Objective:   Physical Exam  Gen. Pleasant, obese, in no distress ENT - no lesions, no post nasal drip Neck: No JVD, no thyromegaly, no carotid bruits Lungs: no use of accessory muscles, no dullness to percussion, decreased without rales or rhonchi  Cardiovascular: Rhythm regular, heart sounds  normal, no murmurs or gallops, no peripheral edema Musculoskeletal: No deformities, no cyanosis or clubbing , no tremors       Assessment & Plan:

## 2021-02-09 NOTE — Patient Instructions (Addendum)
Trial of CPAP mask liner (REM zzz ) Check oxygen level during sleep on biPAP / RA

## 2021-02-13 ENCOUNTER — Telehealth (HOSPITAL_COMMUNITY): Payer: Self-pay

## 2021-02-13 DIAGNOSIS — Z0000 Encounter for general adult medical examination without abnormal findings: Secondary | ICD-10-CM

## 2021-02-13 NOTE — Telephone Encounter (Signed)
Was able to connect with patient via telephone about interest in health coaching. Patient agreed to health coaching to focus on weight management. Patient agreed to meet on March 21st at 9:30am.  Lurline Idol, Sharmaine Base., MPH Candidate [employer] Health Coach Intern

## 2021-02-13 NOTE — Telephone Encounter (Signed)
Patient was called by MPH Candidate for Health Coaching on 02/13/21 and has scheduled initial health coaching session for February 20, 2021 at 9:30am.  Avelino [facility], MS, [place], [facility] Care Guide 02/13/2021 3:41 PM

## 2021-02-14 ENCOUNTER — Telehealth: Payer: Self-pay | Admitting: Pulmonary Disease

## 2021-02-14 NOTE — Telephone Encounter (Signed)
Patient is returning phone call. Patient phone number is [phone].

## 2021-02-14 NOTE — Telephone Encounter (Signed)
Called spoke with patient.  Patient was letting Dr. Elsworth Riddle know they picked up the machine that was monitoring her oxygen levels, She slept with it for 3 nights.   Will route to [place] to follow up on and look for the results to this overnight oxygen test.

## 2021-02-14 NOTE — Telephone Encounter (Signed)
LMTCB

## 2021-02-16 ENCOUNTER — Telehealth: Payer: Self-pay | Admitting: Cardiology

## 2021-02-16 NOTE — Telephone Encounter (Signed)
Pt c/o medication issue:  1. Name of Medication: ELIQUIS 5 MG TABS tablet  2. How are you currently taking this medication (dosage and times per day)? As directed  3. Are you having a reaction (difficulty breathing--STAT)? no  4. What is your medication issue? Patient had to switch insurance plans and her new plan is wanting to charge her $350 for a 3 month supply. She wants to know if Dr. Percival Spanish has any recommendations on another medication that could take this medication's place. Please advise.

## 2021-02-16 NOTE — Telephone Encounter (Signed)
Called [facility] to discuss- Covered but is 350 for 9 day supply.   1st brand name rx filled this year.     Spoke to patient, she states insurance was not going to cover this medication but she does not want to take warfarin.    Advised plan may prefer Xarelto over Eliquis?   She has a 2 months supply of Eliquis, she will call her insurance company and see if Xarelto is preferred or more costly.  She will call back if she wished to transition to Xarelto or if patient assistance forms need to be completed.

## 2021-02-17 ENCOUNTER — Telehealth: Payer: Self-pay | Admitting: Pulmonary Disease

## 2021-02-17 DIAGNOSIS — G4733 Obstructive sleep apnea (adult) (pediatric): Secondary | ICD-10-CM

## 2021-02-17 NOTE — Telephone Encounter (Signed)
Will forward message to RA and Anderson Malta to follow up on ONO results.

## 2021-02-20 ENCOUNTER — Encounter (HOSPITAL_COMMUNITY): Payer: Self-pay

## 2021-02-20 NOTE — Progress Notes (Signed)
Appointment Outcome:  Completed initial health coaching session. 50 minutes.  AGREEMENTS SECTION  Overall Goal(s): Weight Management Patient expresses that it was suggested by her doctor to lose about 3 pounds per month.  Agreement/Action Steps:  1) Patient will start back-up journaling food intake.  Food journal templates were set to the patient via e-mail. 2) Patient will begin walking up & down her drive way to increase physical activity. 3) Patient will use her fit bit to tracking beats per minute and step count daily.  Action steps were sent to the patient after initial health coaching session.  Progress Notes:  Patient's main concern is weight loss. She's expressed that she's tried multiple weight loss programs. She expressed that she was about 313 pounds previously before her current weight of 239 pounds. She's very involved in her church and community and feels that getting out more helps with increasing her physical activity. She also stated that she is a type 2 diabetic and doesn't have to take an insulin shot.She also stated that she's an A-Fib patient - She also stated that she was retaining fluid after medication back in June. However, this is no longer an issues for her. Patient wanted to find a way of tracking her food intake, so we were able to brainstorm around food journals, because that's something that has worked before. Patient expressed that she's not able to do much physical activity and understands what is or what isn't healthy for her to eat. She stated that she does not eat processed foods. Patient explained that [place] and wouldn't mind quick walks through the trail, however pollen may prevent that from happening. Patient decided she will just begin with walking up and down her driveway. Patient explained that she also used a CPAP machine to assist with her sleep apnea. Her fitbit is used to track her beats per minute and count her steps daily. She  expressed that her daughter has the fitbit app on her phone to track her progress. The health coach will meet with Lorna Few for 2nd health coaching session on March 31st @ 9:30 am.  Indicators of Success and Accountability:   Patient wants to increase health overall and does not want to take an insulin shot, therefore realizes changes that need to occur to prevent this from happening.  Readiness:  On a scale of 1-5, with 5 being the readiest. Patient rated herself a 5.   Strengths and Supports: Patient will rely on her husband and daughter to support her with this new change.  Challenges and Barriers:  Patient believes that walking will be the toughest action step to complete, however she expressed that she will continue to remind herself about her goals that she would like to meet. She also expressed that her and her husband go out twice a week, so that may also be a challenge for her because it's something that they've always done. I told the patient to give herself grace as she dives into this new change.  Referrals: I was able to send the patient a contact for the healthy weight & wellness program because she expressed interest in developing healthy eating habits and/or a diet plan.  Larose Kells., MPH Candidate H&V Health Coach Intern

## 2021-02-20 NOTE — Progress Notes (Signed)
Patient was seen by MPH Candidate for Health Coaching.   Health Coach Preceptor agrees with assessment and plan.   Avelino Leeds, MS, [place], [facility] 02/20/2021 2:00 PM

## 2021-02-21 NOTE — Telephone Encounter (Signed)
See phone note from 3.18.22. Will close encounter.

## 2021-02-23 NOTE — Telephone Encounter (Signed)
Called and left message on voicemail to please return phone call to go over ONO results. Contact number provided.

## 2021-02-23 NOTE — Telephone Encounter (Addendum)
ONO on BiPAP/room air showed desaturation for 28 minutes less than 88% on night #1 However on nights#2 and 3, minimal desaturation was noted  She can continue on oxygen 2 L blended into BiPAP -we can reassess in 6 months to a year

## 2021-02-23 NOTE — Telephone Encounter (Signed)
Patient returned phone call, writer confirmed name and birth date and went over ONO results per Dr Elsworth Soho. All questions answered and patient expressed full understanding and agreeable to Dr Bari Mantis recommendations. Patient aware to reassess in 6 months to a year. Recall placed as reminder. Nothing further needed at this time.

## 2021-02-23 NOTE — Addendum Note (Signed)
Addended by: Merrilee Seashore on: 02/23/2021 01:51 PM   Modules accepted: Orders

## 2021-03-02 ENCOUNTER — Telehealth: Payer: Self-pay

## 2021-03-02 ENCOUNTER — Other Ambulatory Visit: Payer: Self-pay

## 2021-03-02 ENCOUNTER — Ambulatory Visit (HOSPITAL_COMMUNITY): Payer: No Typology Code available for payment source

## 2021-03-02 ENCOUNTER — Encounter (HOSPITAL_COMMUNITY): Payer: Self-pay

## 2021-03-02 DIAGNOSIS — Z0000 Encounter for general adult medical examination without abnormal findings: Secondary | ICD-10-CM

## 2021-03-02 NOTE — Telephone Encounter (Signed)
Called patient's home phone for 2nd health coaching session. However, another party answered to confirm that patient had a doctor's appointment at 10 am, therefore was not at the house and/or available to talk with health coach. Left name Corey Skains), and call back number [phone] for patient to call to reschedule. Will follow back up with patient on Monday, April 04 to reschedule.  Larose Kells., MPH Candidate H&V Health Coach Intern

## 2021-03-02 NOTE — Telephone Encounter (Signed)
Patient will be contacted by MPH Candidate/Health Coach on March 06, 2021 to reschedule. In the event that patient returns call prior to Monday, patient will be rescheduled accordingly.   MPH Candidate attempted to contact Patient for [place] session.    Health Web designer agrees plan.   Avelino Leeds, MS, [place], [facility] 03/02/2021 12:13 PM

## 2021-03-06 ENCOUNTER — Telehealth: Payer: Self-pay

## 2021-03-06 DIAGNOSIS — Z0000 Encounter for general adult medical examination without abnormal findings: Secondary | ICD-10-CM

## 2021-03-06 NOTE — Telephone Encounter (Signed)
Patient was contacted by MPH Candidate for Health Coaching.   Health Coach Preceptor confirms rescheduled telephonic health coaching appointment for March 10, 2021, at 9:30am with patient.   Avelino Leeds, MS, [place], [facility] 03/06/2021 2:57 PM

## 2021-03-06 NOTE — Telephone Encounter (Signed)
Connected with patient to reschedule 2nd health coaching session. Patient confirmed commitment to continue the coaching progress. 2nd telephonic health coaching session is scheduled for April 08 at 9:30 am.  Larose Kells., MPH Candidate H&V Health Coach Intern

## 2021-03-09 ENCOUNTER — Telehealth: Payer: Self-pay

## 2021-03-09 DIAGNOSIS — Z0000 Encounter for general adult medical examination without abnormal findings: Secondary | ICD-10-CM

## 2021-03-09 NOTE — Telephone Encounter (Signed)
Called the patient's home phone for 2nd health coaching session. However, was not able to connect with anyone home so left a message. Also called patient's mobile phone where we were able to connect. However, patient explained that she was unavailable and wouldn't be available until afternoon. Will follow back up with patient to reschedule.  Larose Kells., MPH Candidate H&V Health Coach Intern

## 2021-03-13 NOTE — Telephone Encounter (Signed)
MPH Candidate for Health Coaching attempted to contact patient.  Health Coach Preceptor agrees with plan to follow up with patient again.  Avelino Leeds, MS, [place], [facility] 03/13/2021 8:08 AM

## 2021-03-25 IMAGING — US US ABDOMEN LIMITED
1 series · 14 of 25 positions shown · non-contrast
Comparison: 07/05/2005 abdominal CT

CLINICAL DATA: Cirrhosis

EXAM:
ULTRASOUND ABDOMEN LIMITED RIGHT UPPER QUADRANT

[Series 1: us abdomen limited · 14 of 45 slices shown]
[im 1/45]
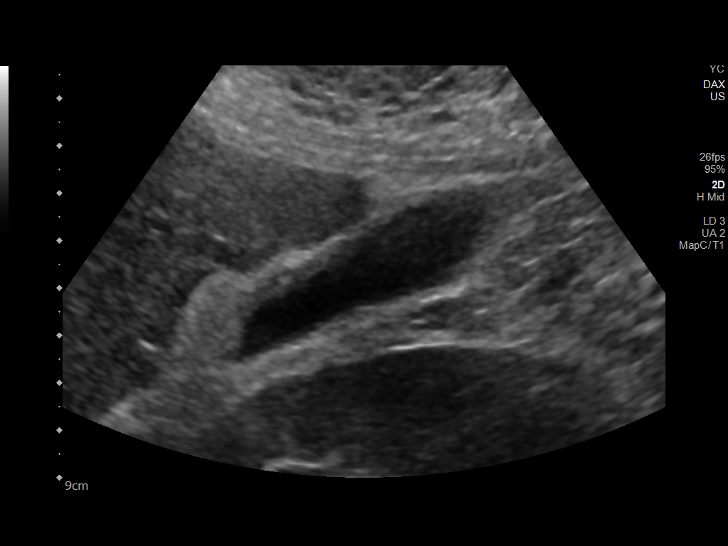
[im 4/45]
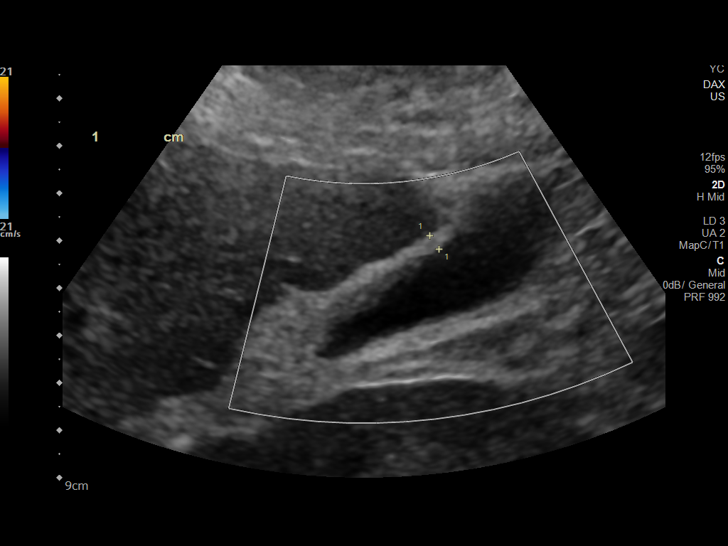
[im 8/45]
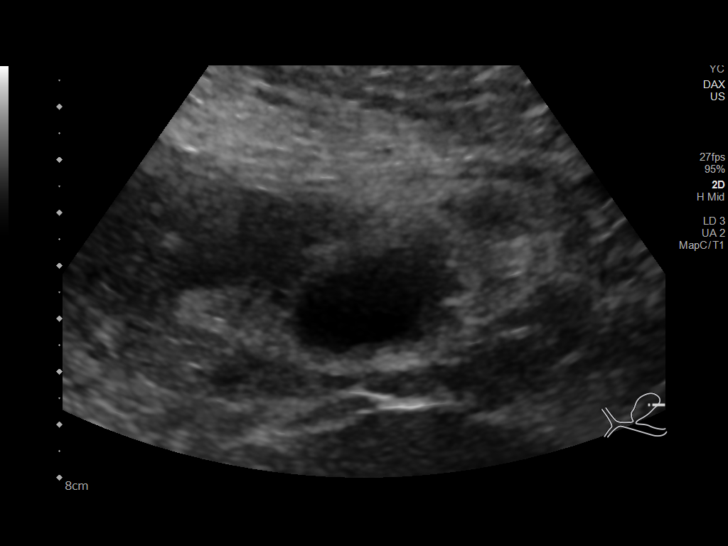
[im 12/45]
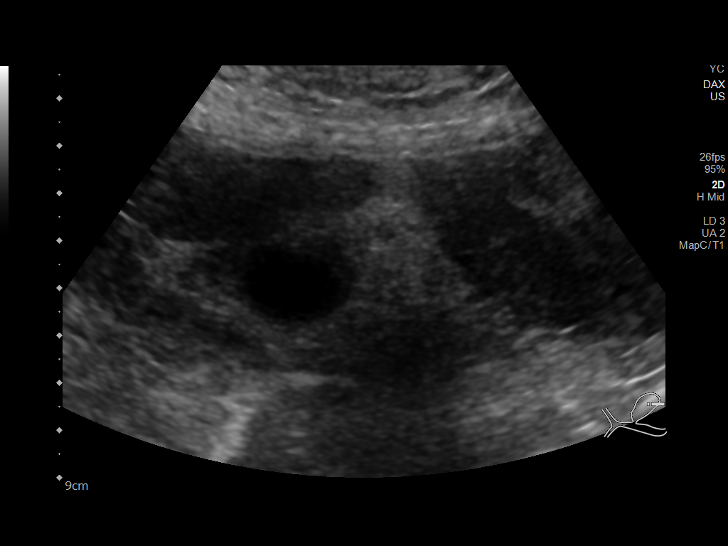
[im 15/45]
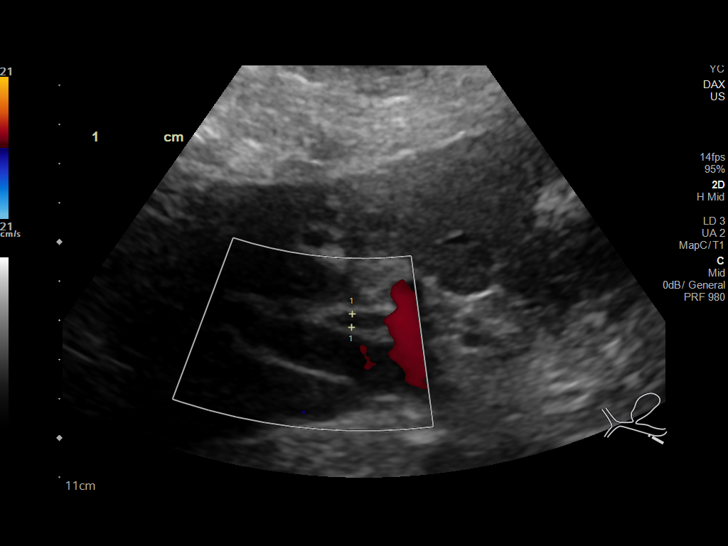
[im 17/45]
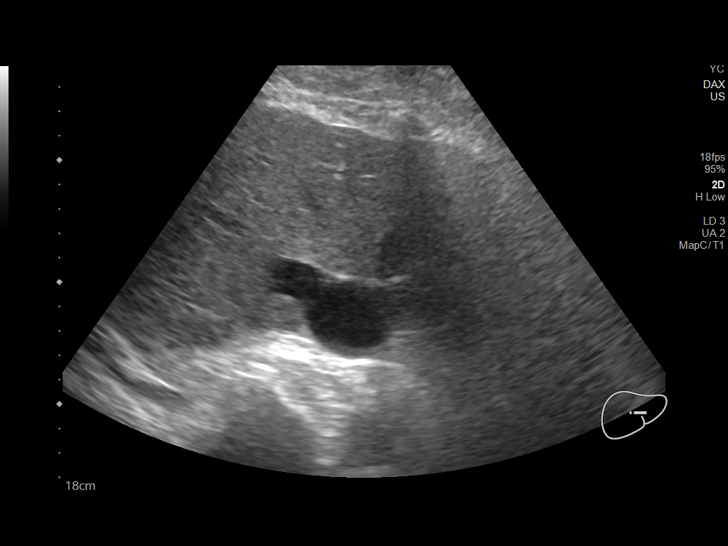
[im 21/45]
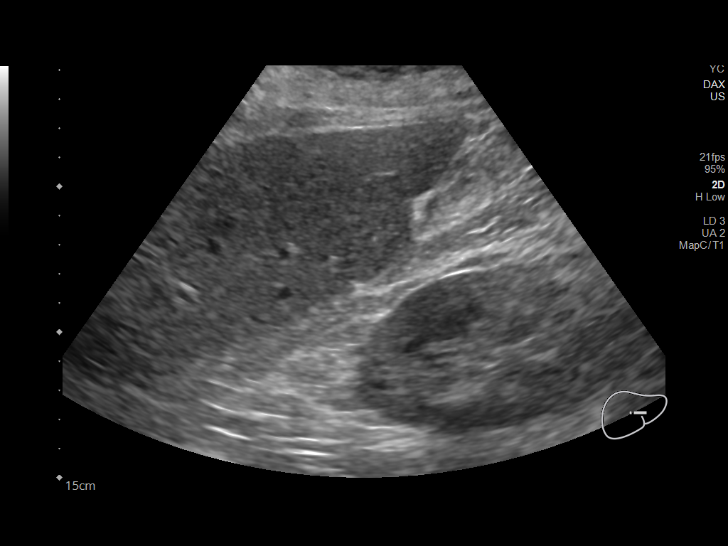
[im 24/45]
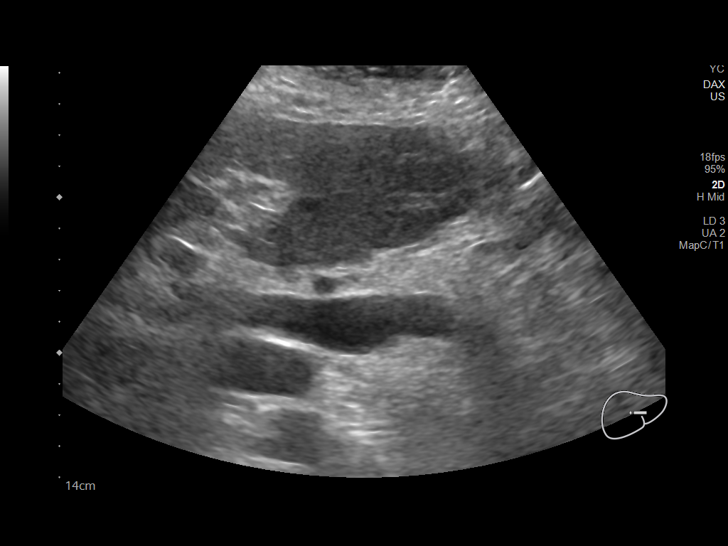
[im 28/45]
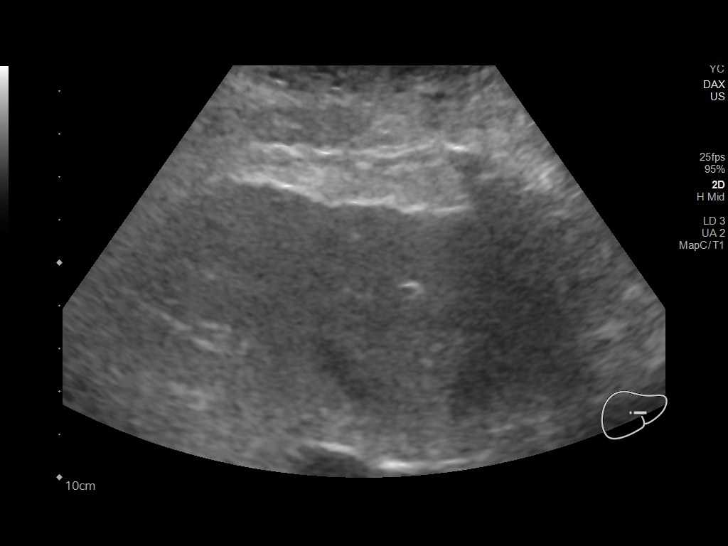
[im 30/45]
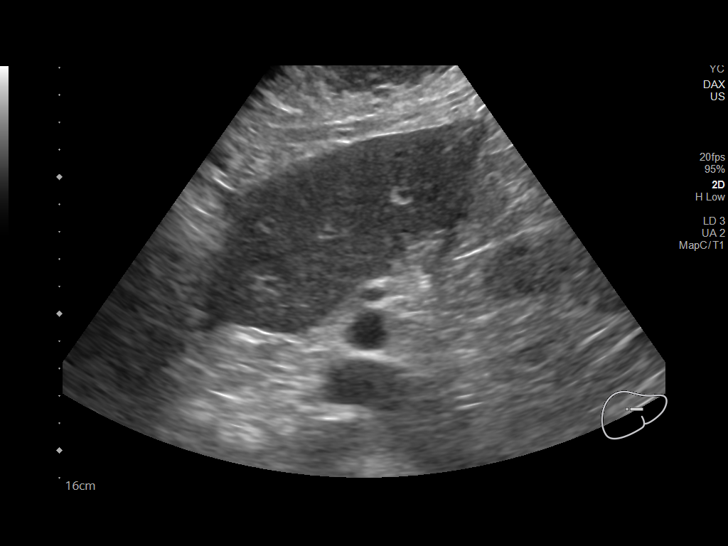
[im 34/45]
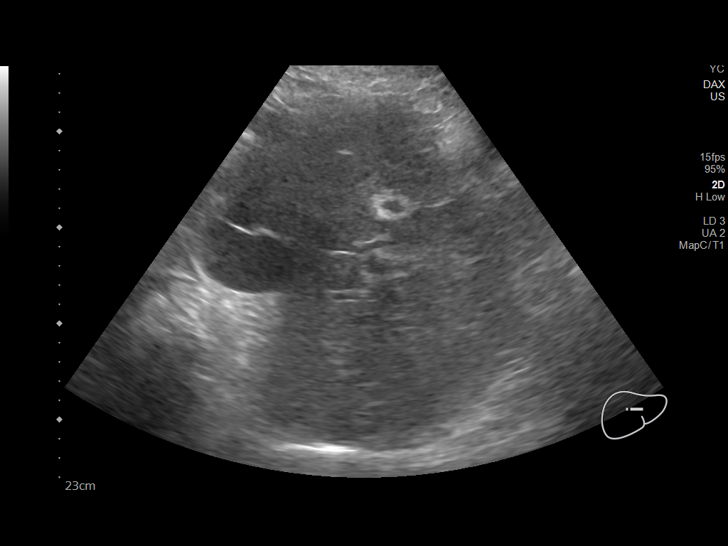
[im 37/45]
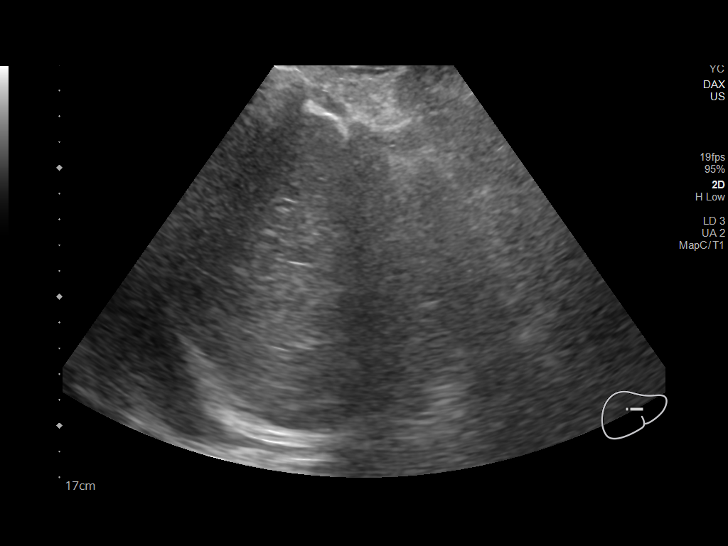
[im 41/45]
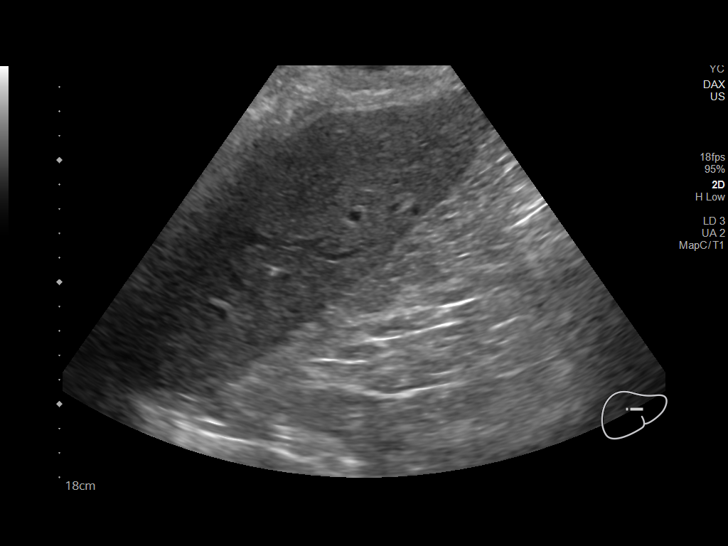
[im 45/45]
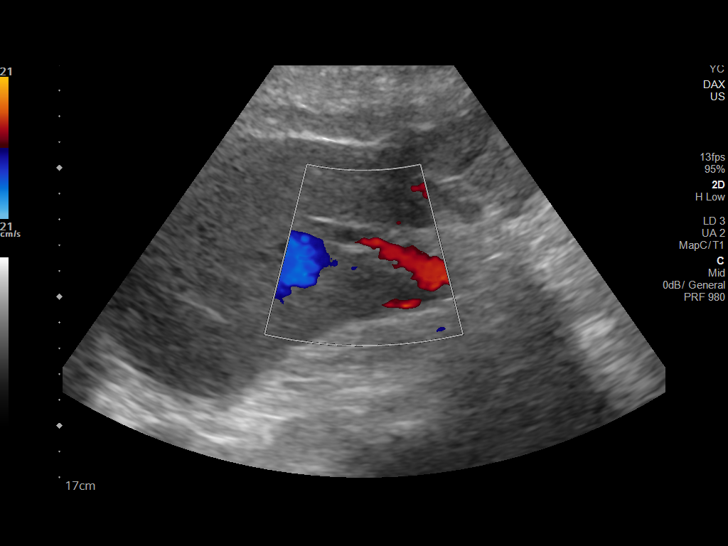

[14 of 25 positions shown; findings below may reference images not displayed]

FINDINGS: Gallbladder:

Partial distension of the gallbladder. No gallstones or wall
thickening visualized. No sonographic Murphy sign noted by
sonographer.

Common bile duct:

Diameter: 4 mm

Liver:

Lobulated liver surface and large central fissures, correlating with
history of cirrhosis. No evidence of mass lesion. Portal vein is
patent on color Doppler imaging with normal direction of blood flow
towards the liver.

Other: Right pleural effusion that is known from chest x-ray
yesterday.
IMPRESSION: 1. Cirrhosis without ascites or visible mass.
2. Right pleural effusion.

## 2021-03-31 IMAGING — DX DG CHEST 1V
1 series · 2 of 2 positions shown · non-contrast
Comparison: 05/14/2020

CLINICAL DATA: Hypoxemia.

EXAM:
CHEST  1 VIEW

[Series 1: chest · 0.14mm/px · 2 of 2 slices shown]
[im 1/2]
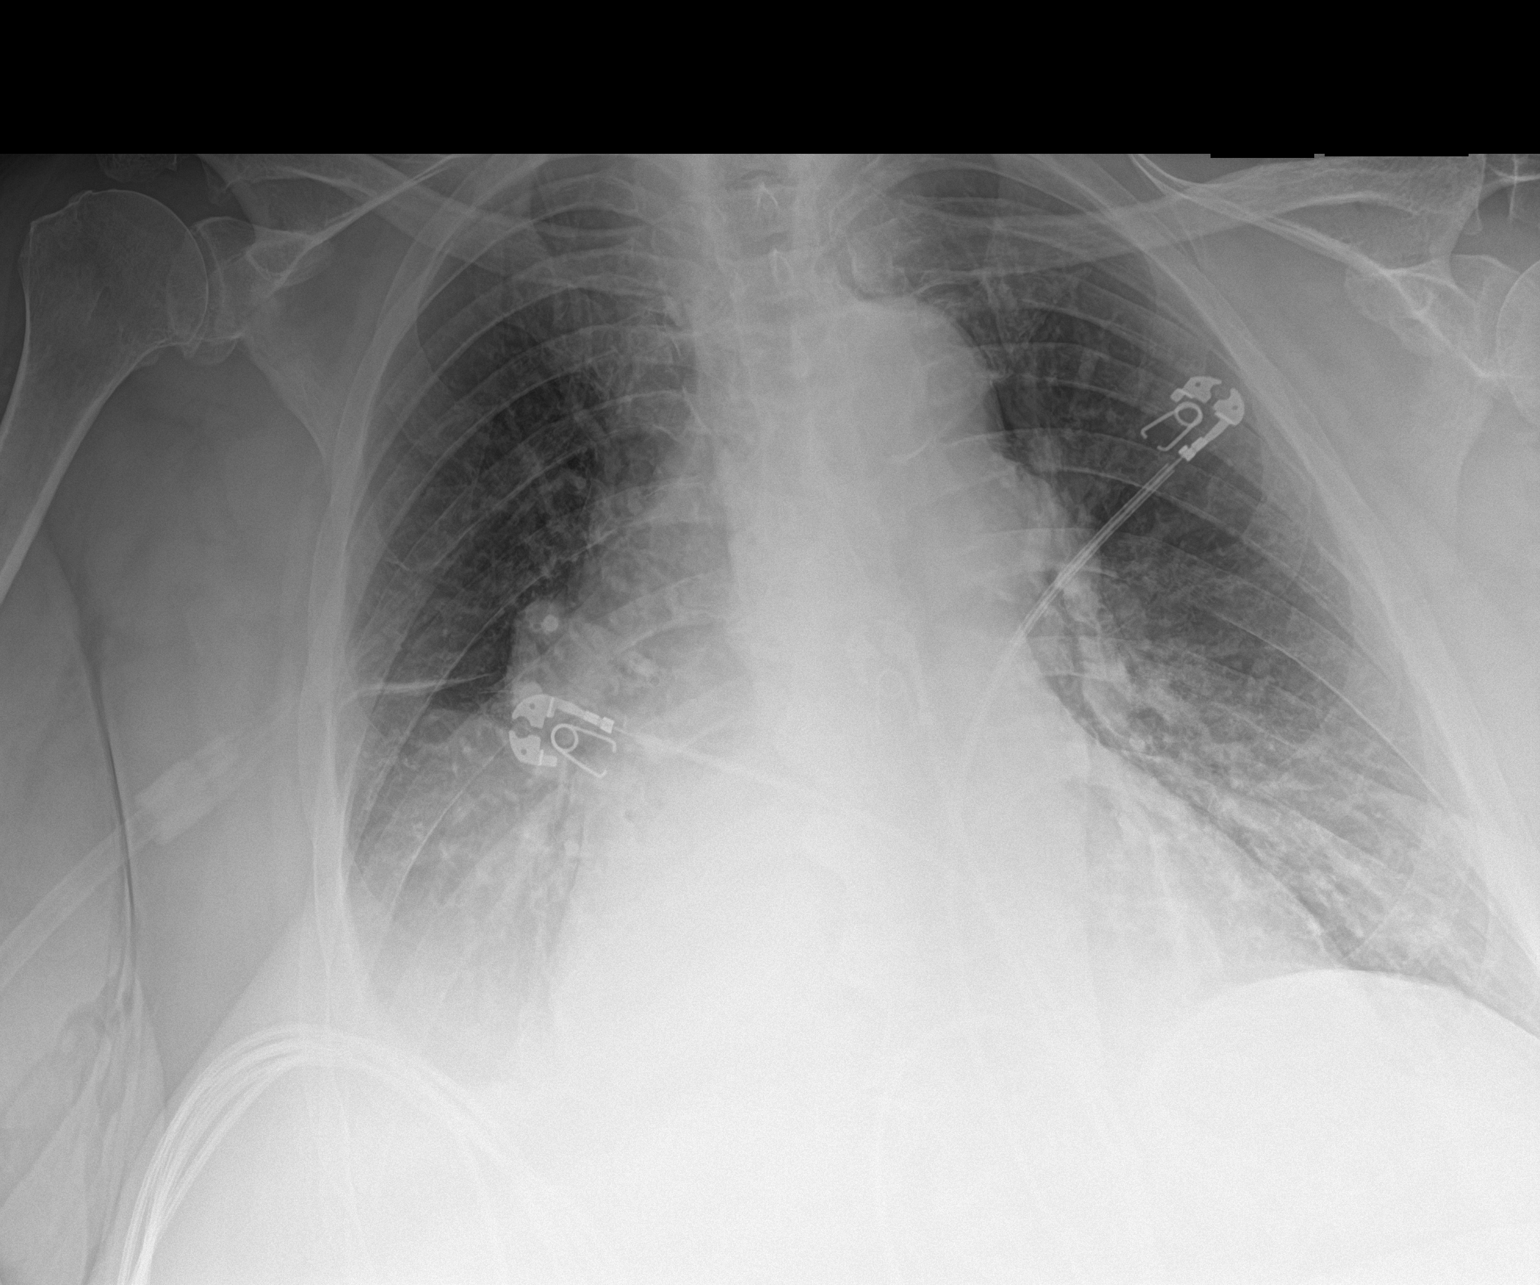
[im 2/2]
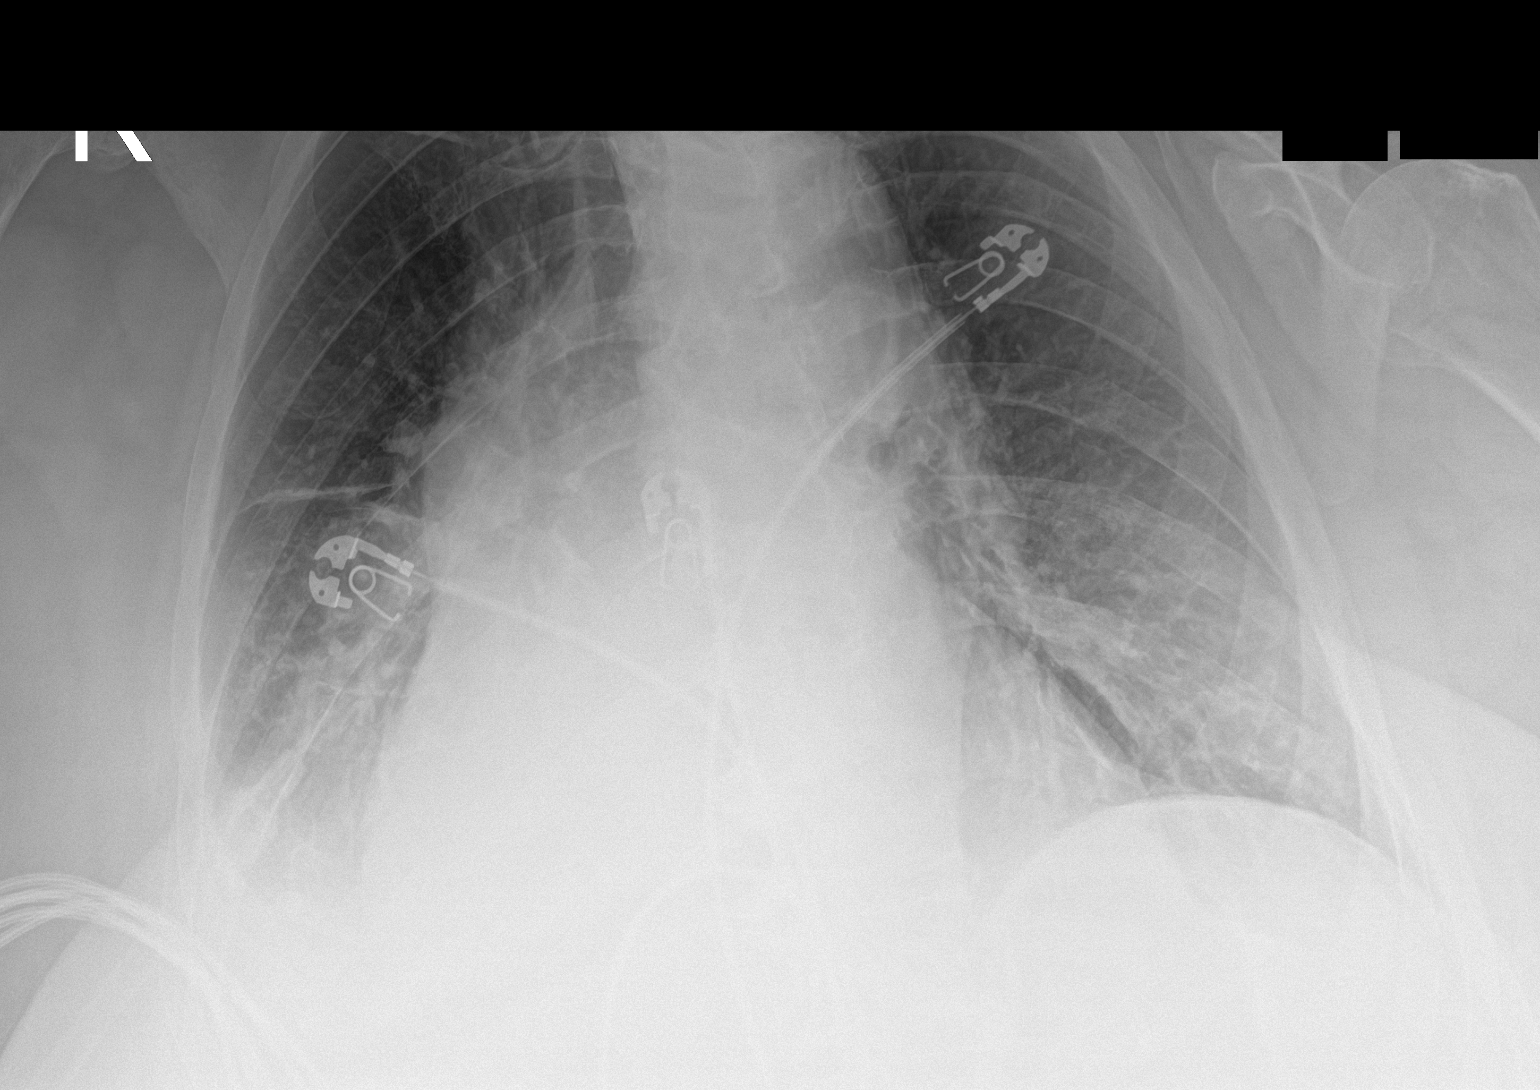

[2 of 2 positions shown; findings below may reference images not displayed]

FINDINGS: Lungs are adequately inflated demonstrate opacification over the
right base with slightly better aeration in the right base. Findings
are likely due to effusion and atelectasis. Stable cardiomegaly.
Remainder the exam is unchanged
IMPRESSION: 1. Mild interval improvement moderate right base opacification
likely effusion with atelectasis.

2. Stable cardiomegaly.

## 2021-04-25 ENCOUNTER — Other Ambulatory Visit: Payer: Self-pay

## 2021-04-25 ENCOUNTER — Ambulatory Visit (HOSPITAL_COMMUNITY): Payer: No Typology Code available for payment source | Attending: Internal Medicine

## 2021-04-25 DIAGNOSIS — I429 Cardiomyopathy, unspecified: Secondary | ICD-10-CM | POA: Insufficient documentation

## 2021-04-25 LAB — ECHOCARDIOGRAM COMPLETE: S' Lateral: 3.4 cm

## 2021-05-10 NOTE — Progress Notes (Signed)
Cardiology Office Note   Date:  05/11/2021   ID:  Krystale, Rinkenberger [birth year], MRN [id number]  PCP:  Leonard Downing, MD  Cardiologist:   Minus Breeding, MD    No chief complaint on file.     History of Present Illness: Amy Riddle is a 75 y.o. female who presents for follow up of SOB and atrial fib.  She was in the hospital in June 2021 with respiratory distress.   She was treated for multifactorial respiratory failure.  She had sleep apnea and hypoventilation obesity syndrome.  She was also thought to have acute systolic and diastolic HF.    Her EF was 45 - 50% in May.  This was not changed from previous.     Since I last saw her she has done well.  The patient denies any new symptoms such as chest discomfort, neck or arm discomfort. There has been no new shortness of breath, PND or orthopnea. There have been no reported palpitations, presyncope or syncope.   Per her report she is watching her diet.  She is trying to go easy on carbs.  She does not really feel her atrial fibrillation.  She tolerates her anticoagulation.   Past Medical History:  Diagnosis Date   Arthritis    Asthma    Diabetes mellitus without complication ([place])    TYPE II no meds   Eczema    Fibroid    Hypertension    Osteoarthritis    Ovarian neoplasm    PRODUCING TESTOSTERONE   Sleep apnea    BiPAP with O2   Submucous myoma of uterus     Past Surgical History:  Procedure Laterality Date   CARDIOVERSION N/A 10/05/2019   Procedure: CARDIOVERSION;  Surgeon: Fay Records, MD;  Location: [place];  Service: Cardiovascular;  Laterality: N/A;   CARPAL TUNNEL RELEASE Left    CARPAL TUNNEL RELEASE Left 03/23/2015   Procedure: LEFT CARPAL TUNNEL RELEASE;  Surgeon: Leandrew Koyanagi, MD;  Location: [place];  Service: Orthopedics;  Laterality: Left;   DERMOID CYST  EXCISION     HERNIA REPAIR     X2   HYSTEROSCOPY     D & C   PELVIC LAPAROSCOPY       Current  Outpatient Medications  Medication Sig Dispense Refill   acetaminophen (TYLENOL) 500 MG tablet Take 1,000 mg by mouth every 8 (eight) hours as needed for mild pain or moderate pain.     ADVAIR DISKUS 500-50 MCG/DOSE AEPB Inhale 1 puff into the lungs 2 (two) times daily.     albuterol (VENTOLIN HFA) 108 (90 Base) MCG/ACT inhaler Inhale 1-2 puffs into the lungs 2 (two) times daily as needed for shortness of breath.      Clobetasol Prop Emollient Base 0.05 % emollient cream Apply 1 application topically daily as needed (eczema).      diclofenac sodium (VOLTAREN) 1 % GEL Apply 2 g topically 2 (two) times daily.     diltiazem (CARDIZEM CD) 120 MG 24 hr capsule TAKE 1 CAPSULE BY MOUTH EVERY DAY 90 capsule 1   ELIQUIS 5 MG TABS tablet TAKE 1 TABLET BY MOUTH TWICE A DAY 180 tablet 1   furosemide (LASIX) 40 MG tablet Take 40 mg by mouth 2 (two) times daily.      levalbuterol (XOPENEX) 1.25 MG/0.5ML nebulizer solution Take 0.63 mg by nebulization every 6 (six) hours as needed for wheezing or shortness of breath. 1 each 12  metFORMIN (GLUCOPHAGE) 1000 MG tablet Take 500-1,000 mg by mouth See admin instructions. Take 1000 mg in the morning and 500 mg in the evening     metoprolol tartrate (LOPRESSOR) 100 MG tablet Take 1 tablet (100 mg total) by mouth 2 (two) times daily. 180 tablet 3   nystatin cream (MYCOSTATIN) Apply 1 application topically daily as needed (yeast under breast).      No current facility-administered medications for this visit.    Allergies:   Tylenol arthritis ext [acetaminophen]    ROS:  Please see the history of present illness.   Otherwise, review of systems are positive for none.   All other systems are reviewed and negative.    PHYSICAL EXAM: VS:  BP 108/80 (BP Location: Left Arm, Patient Position: Sitting, Cuff Size: Large)   Pulse 76   Ht 5' 2.5" (1.588 m)   Wt 255 lb 9.6 oz (115.9 kg)   SpO2 97%   BMI 46.00 kg/m  , BMI Body mass index is 46 kg/m. GENERAL:  Well  appearing NECK:  No jugular venous distention, waveform within normal limits, carotid upstroke brisk and symmetric, no bruits, no thyromegaly LUNGS:  Clear to auscultation bilaterally CHEST:  Unremarkable HEART:  PMI not displaced or sustained,S1 and S2 within normal limits, no O2,DX clicks, no rubs, no murmurs, irregular ABD:  Flat, positive bowel sounds normal in frequency in pitch, no bruits, no rebound, no guarding, no midline pulsatile mass, no hepatomegaly, no splenomegaly EXT:  2 plus pulses throughout, no edema, no cyanosis no clubbing   EKG:  EKG is not ordered today.   Recent Labs: 05/12/2020: Hemoglobin 15.2; Platelets 249 05/16/2020: Magnesium 2.3 05/18/2020: B Natriuretic Peptide 451.5 07/08/2020: BUN 28; Creatinine, Ser 1.05; Potassium 4.6; Sodium 145    Lipid Panel No results found for: CHOL, TRIG, HDL, CHOLHDL, VLDL, LDLCALC, LDLDIRECT    Wt Readings from Last 3 Encounters:  05/11/21 255 lb 9.6 oz (115.9 kg)  02/09/21 251 lb 12.8 oz (114.2 kg)  02/06/21 252 lb 12.8 oz (114.7 kg)      Other studies Reviewed: Additional studies/ records that were reviewed today include:  Labs. Review of the above records demonstrates:    See elsewhere   ASSESSMENT AND PLAN:  CHRONIC SYSTOLIC AND DIASTOLIC HF:     She seems to be euvolemic.  Her blood pressure will not allow titration of meds to include ACE inhibitor or ARN I.  She is needed the Cardizem and beta-blocker for rate control.  She is doing very well so I will continue with the meds as listed.  I do need to evaluate the etiology of her cardiomyopathy since her ejection fraction has not improved.  I will schedule a Lexiscan Myoview..  She would not be able to walk on a treadmill for an ischemia evaluation.  CHRONIC ATRIAL FIB: She tolerates anticoagulation.   Amy Riddle has a CHA2DS2 - VASc score of 4.     She has had excellent rate control and tolerates anticoagulation.  SLEEP APNEA:    She wears BiPAP.    This has been well tolerated.   MORBID OBESITY:   We tried to get her to meet with a Health Coach.  We were not able to get in contact her for this.  We had a long discussion about diet again and we talked particularly carbohydrates.  She seems to be doing well with this.  Current medicines are reviewed at length with the patient today.  The patient does  not have concerns regarding medicines.  The following changes have been made: None  Labs/ tests ordered today include:      Orders Placed This Encounter  Procedures   Cardiac Stress Test: Informed Consent Details: Physician/Practitioner Attestation; Transcribe to consent form and obtain patient signature   Myocardial Perfusion Imaging      Disposition:   FU with me in 6 months   Signed, Minus Breeding, MD  05/11/2021 9:55 AM    [place]

## 2021-05-11 ENCOUNTER — Encounter: Payer: Self-pay | Admitting: Cardiology

## 2021-05-11 ENCOUNTER — Ambulatory Visit (INDEPENDENT_AMBULATORY_CARE_PROVIDER_SITE_OTHER): Payer: No Typology Code available for payment source | Admitting: Cardiology

## 2021-05-11 ENCOUNTER — Other Ambulatory Visit: Payer: Self-pay

## 2021-05-11 VITALS — BP 108/80 | HR 76 | Ht 62.5 in | Wt 255.6 lb

## 2021-05-11 DIAGNOSIS — I429 Cardiomyopathy, unspecified: Secondary | ICD-10-CM | POA: Diagnosis not present

## 2021-05-11 DIAGNOSIS — I4821 Permanent atrial fibrillation: Secondary | ICD-10-CM | POA: Diagnosis not present

## 2021-05-11 DIAGNOSIS — I5042 Chronic combined systolic (congestive) and diastolic (congestive) heart failure: Secondary | ICD-10-CM

## 2021-05-11 NOTE — Patient Instructions (Signed)
Medication Instructions:  Your physician recommends that you continue on your current medications as directed. Please refer to the Current Medication list given to you today.   *If you need a refill on your cardiac medications before your next appointment, please call your pharmacy*  Lab Work: HAVE YOUR PRIMARY SEND LABS FOR REVIEW   If you have labs (blood work) drawn today and your tests are completely normal, you will receive your results only by: MyChart Message (if you have MyChart) OR A paper copy in the mail If you have any lab test that is abnormal or we need to change your treatment, we will call you to review the results.  Testing/Procedures: Your physician has requested that you have a lexiscan myoview. For further information please visit [email or web address]. Please follow instruction sheet, as given. 2 DAY AT [place] [address]   Follow-Up: At [facility], you and your health needs are our priority.  As part of our continuing mission to provide you with exceptional heart care, we have created designated Provider Care Teams.  These Care Teams include your primary Cardiologist (physician) and Advanced Practice Providers (APPs -  Physician Assistants and Nurse Practitioners) who all work together to provide you with the care you need, when you need it.  We recommend signing up for the patient portal called "MyChart".  Sign up information is provided on this After Visit Summary.  MyChart is used to connect with patients for Virtual Visits (Telemedicine).  Patients are able to view lab/test results, encounter notes, upcoming appointments, etc.  Non-urgent messages can be sent to your provider as well.   To learn more about what you can do with MyChart, go to [email or web address].    Your next appointment:   6 month(s)  The format for your next appointment:   In Person  Provider:   You may see Minus Breeding, MD or one of the following Advanced Practice  Providers on your designated Care Team:   Rosaria Ferries, PA-C Jory Sims, DNP, ANP

## 2021-06-07 ENCOUNTER — Ambulatory Visit (HOSPITAL_COMMUNITY): Payer: No Typology Code available for payment source

## 2021-06-08 ENCOUNTER — Ambulatory Visit (HOSPITAL_COMMUNITY): Payer: No Typology Code available for payment source

## 2021-06-09 ENCOUNTER — Other Ambulatory Visit: Payer: Self-pay | Admitting: Cardiology

## 2021-06-13 ENCOUNTER — Other Ambulatory Visit: Payer: Self-pay | Admitting: Physician Assistant

## 2021-06-13 ENCOUNTER — Telehealth (HOSPITAL_COMMUNITY): Payer: Self-pay | Admitting: Cardiology

## 2021-06-13 ENCOUNTER — Other Ambulatory Visit: Payer: Self-pay | Admitting: Cardiology

## 2021-06-13 NOTE — Telephone Encounter (Signed)
Message routed to [facility] & Dr. Percival Spanish as Juluis Rainier

## 2021-06-13 NOTE — Telephone Encounter (Signed)
Patient has declined to schedule Myoview at this time. She states she is hesitant to have the chemicals in her system right now and she is doing fine. She states she doesn't see Dr. Percival Spanish til December so she will have more time to think about it.  She will call me back if she decides to have it! Thank you.

## 2021-06-14 NOTE — Telephone Encounter (Signed)
52f, 115.9kg, scr 0.98 05/18/21, lovw/hochrein 05/11/21

## 2021-08-22 ENCOUNTER — Encounter: Payer: Self-pay | Admitting: Pulmonary Disease

## 2021-08-22 ENCOUNTER — Ambulatory Visit (INDEPENDENT_AMBULATORY_CARE_PROVIDER_SITE_OTHER): Payer: No Typology Code available for payment source | Admitting: Pulmonary Disease

## 2021-08-22 ENCOUNTER — Other Ambulatory Visit: Payer: Self-pay

## 2021-08-22 DIAGNOSIS — G4733 Obstructive sleep apnea (adult) (pediatric): Secondary | ICD-10-CM

## 2021-08-22 DIAGNOSIS — J9611 Chronic respiratory failure with hypoxia: Secondary | ICD-10-CM

## 2021-08-22 DIAGNOSIS — J9612 Chronic respiratory failure with hypercapnia: Secondary | ICD-10-CM

## 2021-08-22 NOTE — Assessment & Plan Note (Signed)
BiPAP download was reviewed which shows good compliance with average pressure 15/11 she has a large leak but events are controlled.  Compliance is more than 5 hours every night. Overall BiPAP is certainly helped improve her daytime somnolence and fatigue  Weight loss encouraged, compliance with goal of at least 4-6 hrs every night is the expectation. Advised against medications with sedative side effects Cautioned against driving when sleepy - understanding that sleepiness will vary on a day to day basis

## 2021-08-22 NOTE — Progress Notes (Signed)
   Subjective:    Patient ID: Amy Riddle, female    DOB: 07-29-1946, 75 y.o.   MRN: [id number]  HPI  75 yo  Remote smoker with OSA/OHS & chronic respiratory failure, on BiPAP +2 L oxygen. CODE STATUS DNR   PMH - A-Fib - on eliquis, cardizem.  s/p  cardioversion   HTN - on losartan, lasix.  Prior LVEF 40-45% Asthma - on advair  DM II - on metformin  Morbid Obesity  Former Smoker - quit 1975   She was hospitalized 05/2020, presented with shortness of breath, hypoxia and bilateral lower extremity edema, with mild leukocytosis 13.1 and AKI with creatinine elevated to 2.7   ABG showed hypercarbia 7.2 5/82/74 on 60% FiO2 Echo showed EF 40 to 45% She was treated withLasix, bronchodilators, Pulmicort and Brovana and also empiric ceftriaxone and azithromycin.  BiPAP was used and transitioned to nightly she was discharged on 3 L of oxygen and trilogy Switched to BiPAP 11/2020 after sleep studies   Weight on discharge was 249 pounds She has now gained up to 261 pounds.  Reports compliance with her BiPAP machine, denies problems with mask or pressure.  On her last visit we asked her to use a cloth mask liner which she has used with some success. Reports mild pedal edema, no orthopnea or PND Compliant with Lasix twice daily She admits to not using her p.m. dose of Advair   Significant tests/ events reviewed   Limited ECHO 05/2020 >> LVEF ~ 40-45%, LV mildly decreased function, LV demonstrates global hypokinesis, interventricular septum flattened in systole consistent with RV pressure overload, RV systolic function moderately reduced, RV is severely enlarged, LA mildly dilated, RA severely dilated Renal US 6/9 >> no mass or hydronephrosis, small amount left-sided perinephric fluid V/Q scan 6/11 >> low probability for PE   NPSG 08/2020 mod OSA AHI 19/h with severe desatn , nadir 66%, required 2L O2   Titration 10/2020 >> BiPAP 20/16 , nadir 79% 01/2021 ONO on BiPAP/room air showed  desaturation for 28 minutes on night #1 However on nights#2 and 3, minimal desaturation was noted  Review of Systems neg for any significant sore throat, dysphagia, itching, sneezing, nasal congestion or excess/ purulent secretions, fever, chills, sweats, unintended wt loss, pleuritic or exertional cp, hempoptysis, orthopnea pnd or change in chronic leg swelling. Also denies presyncope, palpitations, heartburn, abdominal pain, nausea, vomiting, diarrhea or change in bowel or urinary habits, dysuria,hematuria, rash, arthralgias, visual complaints, headache, numbness weakness or ataxia.     Objective:   Physical Exam  Gen. Pleasant, obese, in no distress ENT - no lesions, no post nasal drip Neck: No JVD, no thyromegaly, no carotid bruits Lungs: no use of accessory muscles, no dullness to percussion, decreased without rales or rhonchi  Cardiovascular: Rhythm regular, heart sounds  normal, no murmurs or gallops, no peripheral edema Musculoskeletal: No deformities, no cyanosis or clubbing , no tremors       Assessment & Plan:

## 2021-08-22 NOTE — Assessment & Plan Note (Signed)
She continues to require 2 Loxygen blended into her BiPAP machine We will reassess with nocturnal oximetry next year when her fluid status is better controlled

## 2021-08-22 NOTE — Patient Instructions (Signed)
[facility] to see if they can do mask fitting & check hose for leak Call [phone] for mask fitting session

## 2021-08-30 ENCOUNTER — Other Ambulatory Visit: Payer: Self-pay | Admitting: Cardiology

## 2021-08-31 ENCOUNTER — Telehealth: Payer: Self-pay

## 2021-08-31 MED ORDER — ELIQUIS 5 MG PO TABS: 5.0000 mg | ORAL_TABLET | Freq: Two times a day (BID) | ORAL | 1 refills | Status: DC

## 2021-08-31 NOTE — Telephone Encounter (Signed)
Prescription refill request for Eliquis received. Indication: AFIB  Last office visit: Hochrein, 05/11/2021 Scr: 0.98, 05/18/2021 Age: 75 yo Weight: 118.6 kg   Refill sent.

## 2021-09-22 ENCOUNTER — Telehealth: Payer: Self-pay | Admitting: Pulmonary Disease

## 2021-09-22 MED ORDER — AMOXICILLIN-POT CLAVULANATE 875-125 MG PO TABS
1.0000 | ORAL_TABLET | Freq: Two times a day (BID) | ORAL | 0 refills | Status: DC
Start: 2021-09-22 — End: 2021-11-07

## 2021-09-22 NOTE — Telephone Encounter (Signed)
Called and spoke with patient. She verbalized understanding of RA's recs. She wants to hold off on the prednisone for now because she said she took it a while back and she did not have a good reaction with it. But she does want to proceed with the abx. This will be sent to [facility] on [address]   Nothing further needed at time of call.

## 2021-09-22 NOTE — Telephone Encounter (Signed)
Called and spoke with pt who states she developed a cold about a week ago which has gotten a little worse. Pt is coughing up pale yellow phlegm, has some wheezing, and also has postnasal drainage which is clear but states that she did start having some nose bleeds.  Pt said that she is taking Eliquis which thinks could be part of the reason why her nose started bleeding.  Due to the wheezing, pt has used her nebulizer and stated that she did 3 neb treatments yesterday 10/20 which did help. Pt has not used her rescue inhaler. Pt is using her Advair inhaler every day as prescribed.  Asked pt if she has tried any nasal sprays due to the congestion and she stated that she has not.  Pt is requesting abx to be sent to pharmacy for her and if there is anything else that could be recommended. Dr. Elsworth Soho, please advise.

## 2021-11-05 NOTE — Progress Notes (Signed)
Cardiology Office Note   Date:  11/07/2021   ID:  [name], [name] [birth year], MRN [id number]  PCP:  Leonard Downing, MD  Cardiologist:   Minus Breeding, MD    Chief Complaint  Patient presents with   Atrial Fibrillation       History of Present Illness: Amy Riddle is a 75 y.o. female who presents for follow up of SOB and atrial fib.  She was in the hospital in June 2021 with respiratory distress.   She was treated for multifactorial respiratory failure.  She had sleep apnea and hypoventilation obesity syndrome.  She was also thought to have acute systolic and diastolic HF.    Her EF was 45 - 50% in May.  This was not changed from previous.      Since I last saw her she was to have a follow up [place] to evaluate SOB and her mildly reduced EF.  However, she canceled this.  She said that she felt better and she did not want to have this done.  She says that now she thinks she is feeling well.  Her weight is down from its peak put up a little bit on the last visit.  She is trying to watch all the time what she eats.  She stays active in the house but she is not exercising routinely.  She is not having any new shortness of breath, PND or orthopnea.  She had no new palpitations, presyncope or syncope.  She really does not notice her atrial fibrillation.   Past Medical History:  Diagnosis Date   Arthritis    Asthma    Diabetes mellitus without complication ([place])    TYPE II no meds   Eczema    Fibroid    Hypertension    Osteoarthritis    Ovarian neoplasm    PRODUCING TESTOSTERONE   Sleep apnea    BiPAP with O2   Submucous myoma of uterus     Past Surgical History:  Procedure Laterality Date   CARDIOVERSION N/A 10/05/2019   Procedure: CARDIOVERSION;  Surgeon: Fay Records, MD;  Location: [place];  Service: Cardiovascular;  Laterality: N/A;   CARPAL TUNNEL RELEASE Left    CARPAL TUNNEL RELEASE Left 03/23/2015   Procedure: LEFT CARPAL  TUNNEL RELEASE;  Surgeon: Leandrew Koyanagi, MD;  Location: [place];  Service: Orthopedics;  Laterality: Left;   DERMOID CYST  EXCISION     HERNIA REPAIR     X2   HYSTEROSCOPY     D & C   PELVIC LAPAROSCOPY       Current Outpatient Medications  Medication Sig Dispense Refill   acetaminophen (TYLENOL) 500 MG tablet Take 1,000 mg by mouth every 8 (eight) hours as needed for mild pain or moderate pain.     ADVAIR DISKUS 500-50 MCG/DOSE AEPB Inhale 1 puff into the lungs 2 (two) times daily.     albuterol (VENTOLIN HFA) 108 (90 Base) MCG/ACT inhaler Inhale 1-2 puffs into the lungs 2 (two) times daily as needed for shortness of breath.      Clobetasol Prop Emollient Base 0.05 % emollient cream Apply 1 application topically daily as needed (eczema).      colchicine 0.6 MG tablet Take 0.6 mg by mouth as needed.     diclofenac sodium (VOLTAREN) 1 % GEL Apply 2 g topically 2 (two) times daily.     diltiazem (CARDIZEM CD) 120 MG 24 hr capsule TAKE 1  CAPSULE BY MOUTH EVERY DAY 90 capsule 2   ELIQUIS 5 MG TABS tablet Take 1 tablet (5 mg total) by mouth 2 (two) times daily. 180 tablet 1   furosemide (LASIX) 40 MG tablet Take 40 mg by mouth 2 (two) times daily.      levalbuterol (XOPENEX) 1.25 MG/0.5ML nebulizer solution Take 0.63 mg by nebulization every 6 (six) hours as needed for wheezing or shortness of breath. 1 each 12   metFORMIN (GLUCOPHAGE) 1000 MG tablet Take 500-1,000 mg by mouth See admin instructions. Take 1000 mg in the morning and 500 mg in the evening     metoprolol tartrate (LOPRESSOR) 100 MG tablet TAKE 1 TABLET BY MOUTH TWICE A DAY 180 tablet 3   nystatin cream (MYCOSTATIN) Apply 1 application topically daily as needed (yeast under breast).      No current facility-administered medications for this visit.    Allergies:   Tylenol arthritis ext [acetaminophen]    ROS:  Please see the history of present illness.   Otherwise, review of systems are positive for none.   All  other systems are reviewed and negative.    PHYSICAL EXAM: VS:  BP 120/68   Pulse 71   Ht 5\' 3"  (1.6 m)   Wt 256 lb 12.8 oz (116.5 kg)   SpO2 97%   BMI 45.49 kg/m  , BMI Body mass index is 45.49 kg/m. GENERAL:  Well appearing NECK:  No jugular venous distention, waveform within normal limits, carotid upstroke brisk and symmetric, no bruits, no thyromegaly LUNGS:  Clear to auscultation bilaterally CHEST:  Unremarkable HEART:  PMI not displaced or sustained,S1 and S2 within normal limits, no S3, no clicks, no rubs, no murmurs, irregular  ABD:  Flat, positive bowel sounds normal in frequency in pitch, no bruits, no rebound, no guarding, no midline pulsatile mass, no hepatomegaly, no splenomegaly EXT:  2 plus pulses throughout, no edema, no cyanosis no clubbing  EKG:  EKG is  ordered today. Atrial fibrillation, rate 71, axis within normal limits, intervals within normal limits, poor anterior R wave progression, premature ventricular contraction, no acute ST-T wave changes.  Recent Labs: No results found for requested labs within last 8760 hours.    Lipid Panel No results found for: CHOL, TRIG, HDL, CHOLHDL, VLDL, LDLCALC, LDLDIRECT    Wt Readings from Last 3 Encounters:  11/07/21 256 lb 12.8 oz (116.5 kg)  08/22/21 261 lb 6.4 oz (118.6 kg)  05/11/21 255 lb 9.6 oz (115.9 kg)      Other studies Reviewed: Additional studies/ records that were reviewed today include:  None. Review of the above records demonstrates:    NA   ASSESSMENT AND PLAN:  CHRONIC SYSTOLIC AND DIASTOLIC HF:     She seems to be euvolemic.  No change in therapy.   CHRONIC ATRIAL FIB:  Amy Riddle has a CHA2DS2 - VASc score of 4.    She tolerates anticoagulation and seems to have reasonable rate control.   Today we will be checking a CBC and a basic metabolic profile if she has not had these in a while.  SLEEP APNEA:    She wears BiPAP.     MORBID OBESITY:   We had long discussions about this  and again I discussed increasing her exercise.   Current medicines are reviewed at length with the patient today.  The patient does not have concerns regarding medicines.  The following changes have been made: None  Labs/ tests ordered today include:  None   Orders Placed This Encounter  Procedures   CBC   Basic metabolic panel   EKG [id number]       Disposition:   FU with APP in six months   Signed, Minus Breeding, MD  11/07/2021 1:44 PM    [facility]

## 2021-11-07 ENCOUNTER — Other Ambulatory Visit: Payer: Self-pay

## 2021-11-07 ENCOUNTER — Encounter: Payer: Self-pay | Admitting: Cardiology

## 2021-11-07 ENCOUNTER — Ambulatory Visit (INDEPENDENT_AMBULATORY_CARE_PROVIDER_SITE_OTHER): Payer: No Typology Code available for payment source | Admitting: Cardiology

## 2021-11-07 VITALS — BP 120/68 | HR 71 | Ht 63.0 in | Wt 256.8 lb

## 2021-11-07 DIAGNOSIS — I482 Chronic atrial fibrillation, unspecified: Secondary | ICD-10-CM

## 2021-11-07 DIAGNOSIS — G4730 Sleep apnea, unspecified: Secondary | ICD-10-CM

## 2021-11-07 DIAGNOSIS — I5041 Acute combined systolic (congestive) and diastolic (congestive) heart failure: Secondary | ICD-10-CM

## 2021-11-07 NOTE — Patient Instructions (Signed)
Medication Instructions:  Your Physician recommend you continue on your current medication as directed.    *If you need a refill on your cardiac medications before your next appointment, please call your pharmacy*   Lab Work: Today BMET and CBC  If you have labs (blood work) drawn today and your tests are completely normal, you will receive your results only by: [place] (if you have MyChart) OR A paper copy in the mail If you have any lab test that is abnormal or we need to change your treatment, we will call you to review the results.   Follow-Up: At [facility], you and your health needs are our priority.  As part of our continuing mission to provide you with exceptional heart care, we have created designated Provider Care Teams.  These Care Teams include your primary Cardiologist (physician) and Advanced Practice Providers (APPs -  Physician Assistants and Nurse Practitioners) who all work together to provide you with the care you need, when you need it.  We recommend signing up for the patient portal called "MyChart".  Sign up information is provided on this After Visit Summary.  MyChart is used to connect with patients for Virtual Visits (Telemedicine).  Patients are able to view lab/test results, encounter notes, upcoming appointments, etc.  Non-urgent messages can be sent to your provider as well.   To learn more about what you can do with MyChart, go to [email or web address].    Your next appointment:   6 month(s)  The format for your next appointment:   In Person  Provider:   Coletta Memos, FNP, RN

## 2021-11-08 LAB — CBC
Hematocrit: 42.4 % (ref 34.0–46.6)
Hemoglobin: 14 g/dL (ref 11.1–15.9)
MCH: 28.9 pg (ref 26.6–33.0)
MCHC: 33 g/dL (ref 31.5–35.7)
MCV: 87 fL (ref 79–97)
Platelets: 223 10*3/uL (ref 150–450)
RBC: 4.85 x10E6/uL (ref 3.77–5.28)
RDW: 14.2 % (ref 11.7–15.4)
WBC: 8.3 10*3/uL (ref 3.4–10.8)

## 2021-11-08 LAB — BASIC METABOLIC PANEL
BUN/Creatinine Ratio: 21 (ref 12–28)
BUN: 23 mg/dL (ref 8–27)
CO2: 27 mmol/L (ref 20–29)
Calcium: 9.4 mg/dL (ref 8.7–10.3)
Chloride: 100 mmol/L (ref 96–106)
Creatinine, Ser: 1.12 mg/dL — ABNORMAL HIGH (ref 0.57–1.00)
Glucose: 94 mg/dL (ref 70–99)
Potassium: 4.5 mmol/L (ref 3.5–5.2)
Sodium: 143 mmol/L (ref 134–144)
eGFR: 52 mL/min/{1.73_m2} — ABNORMAL LOW (ref >59)

## 2021-11-10 ENCOUNTER — Encounter: Payer: Self-pay | Admitting: *Deleted

## 2022-02-14 ENCOUNTER — Ambulatory Visit (INDEPENDENT_AMBULATORY_CARE_PROVIDER_SITE_OTHER): Payer: No Typology Code available for payment source | Admitting: Pulmonary Disease

## 2022-02-14 ENCOUNTER — Encounter: Payer: Self-pay | Admitting: Pulmonary Disease

## 2022-02-14 ENCOUNTER — Other Ambulatory Visit: Payer: Self-pay

## 2022-02-14 VITALS — BP 120/78 | HR 76 | Temp 97.4°F | Ht 62.5 in | Wt 264.0 lb

## 2022-02-14 DIAGNOSIS — G4733 Obstructive sleep apnea (adult) (pediatric): Secondary | ICD-10-CM

## 2022-02-14 DIAGNOSIS — J9611 Chronic respiratory failure with hypoxia: Secondary | ICD-10-CM | POA: Diagnosis not present

## 2022-02-14 DIAGNOSIS — J9612 Chronic respiratory failure with hypercapnia: Secondary | ICD-10-CM | POA: Diagnosis not present

## 2022-02-14 MED ORDER — LEVALBUTEROL HCL 1.25 MG/0.5ML IN NEBU: 0.6300 mg | INHALATION_SOLUTION | Freq: Four times a day (QID) | RESPIRATORY_TRACT | 12 refills | Status: DC | PRN

## 2022-02-14 NOTE — Progress Notes (Signed)
? ?  Subjective:  ? ? Patient ID: Amy Riddle, female    DOB: 1946/11/09, 76 y.o.   MRN: [id number] ? ?HPI ? ?76 yo  Remote smoker with OSA/OHS & chronic respiratory failure, on BiPAP +2 L oxygen. ?CODE STATUS DNR ?  ?PMH - A-Fib - on eliquis, cardizem.  s/p  cardioversion   ?HTN - on losartan, lasix.  Prior LVEF 40-45% ?Asthma - on advair  ?DM II - on metformin  ?Morbid Obesity  ?Former Smoker - quit 1975 ?  ?She was hospitalized 05/2020, acute hypercarbic respiratory failure, AKI and cor pulmonale ?  ?ABG  7.2 5/82/74 on 60% FiO2 ?she was discharged on 3 L of oxygen and trilogy ?Switched to BiPAP 11/2020 after sleep studies ?She has done well since then. ?Has gained some weight from her discharge weight of 249 pounds to her current weight of 264. ?She was cut down Lasix to 20 mg twice daily ? ?Reports compliance with BiPAP, denies any problems with mask or pressure.  The leak does bother her on occasion but does not consistently wake her up from sleep ? ?Reviewed cardiology consultation ? ?Significant tests/ events reviewed ? ?Limited ECHO 05/2020 >> LVEF ~ 40-45%, LV mildly decreased function, LV demonstrates global hypokinesis, interventricular septum flattened in systole consistent with RV pressure overload, RV systolic function moderately reduced, RV is severely enlarged, LA mildly dilated, RA severely dilated ?Renal US 6/9 >> no mass or hydronephrosis, small amount left-sided perinephric fluid ?V/Q scan 6/11 >> low probability for PE ?  ?NPSG 08/2020 mod OSA AHI 19/h with severe desatn , nadir 66%, required 2L O2 ?  ?Titration 10/2020 >> BiPAP 20/16 , nadir 79% ?01/2021 ONO on BiPAP/room air showed desaturation for 28 minutes on night #1 ?However on nights#2 and 3, minimal desaturation was noted ? ? ?Review of Systems ?neg for any significant sore throat, dysphagia, itching, sneezing, nasal congestion or excess/ purulent secretions, fever, chills, sweats, unintended wt loss, pleuritic or exertional cp,  hempoptysis, orthopnea pnd or change in chronic leg swelling. Also denies presyncope, palpitations, heartburn, abdominal pain, nausea, vomiting, diarrhea or change in bowel or urinary habits, dysuria,hematuria, rash, arthralgias, visual complaints, headache, numbness weakness or ataxia. ? ?   ?Objective:  ? Physical Exam ? ?Gen. Pleasant, obese, in no distress ?ENT - no lesions, no post nasal drip, class II airway ?Neck: No JVD, no thyromegaly, no carotid bruits ?Lungs: no use of accessory muscles, no dullness to percussion, decreased without rales or rhonchi  ?Cardiovascular: Rhythm regular, heart sounds  normal, no murmurs or gallops, 1+ peripheral edema ?Musculoskeletal: No deformities, no cyanosis or clubbing , no tremors ? ? ? ?   ?Assessment & Plan:  ? ? ?

## 2022-02-14 NOTE — Patient Instructions (Signed)
? ?   Call [phone] for mask fitting session appt ? ?Bloating may be related to high pressure on machine ? ? ?

## 2022-02-14 NOTE — Assessment & Plan Note (Signed)
Continue 3 L oxygen blended into BiPAP during sleep. ?In the future we will consider repeating nocturnal oximetry on BiPAP and room air ?

## 2022-02-14 NOTE — Assessment & Plan Note (Signed)
BiPAP download was reviewed which shows good control of events on auto BiPAP with average pressures 18/14 cm.  She has moderate leak about 50% of the time.  She has excellent compliance more than 5 hours every night on average ?BiPAP is certainly helped improve her daytime somnolence and fatigue, improve ventilation and prevent hospital admissions ? ?Weight loss encouraged, compliance with goal of at least 4-6 hrs every night is the expectation. ?Advised against medications with sedative side effects ?Cautioned against driving when sleepy - understanding that sleepiness will vary on a day to day basis ? ?

## 2022-03-28 ENCOUNTER — Ambulatory Visit (HOSPITAL_BASED_OUTPATIENT_CLINIC_OR_DEPARTMENT_OTHER): Payer: No Typology Code available for payment source | Attending: Pulmonary Disease | Admitting: Pulmonary Disease

## 2022-03-28 DIAGNOSIS — G4733 Obstructive sleep apnea (adult) (pediatric): Secondary | ICD-10-CM

## 2022-04-09 ENCOUNTER — Other Ambulatory Visit: Payer: Self-pay

## 2022-04-09 MED ORDER — ELIQUIS 5 MG PO TABS: 5.0000 mg | ORAL_TABLET | Freq: Two times a day (BID) | ORAL | 1 refills | Status: DC

## 2022-04-09 NOTE — Telephone Encounter (Signed)
Prescription refill request for Eliquis received. ?Indication:Afib ?Last office visit:12/22 ?Scr:1.1 ?Age: 76 ?Weight:119.7 kg ? ?Prescription refilled ? ?

## 2022-05-07 NOTE — Progress Notes (Unsigned)
Cardiology Office Note   Date:  05/08/2022   ID:  Amy, Riddle 1946-02-25, MRN [id number]  PCP:  Leonard Downing, MD  Cardiologist:   Minus Breeding, MD   Chief Complaint  Patient presents with   Atrial Fibrillation     History of Present Illness: Amy Riddle is a 76 y.o. female who presents for follow up of SOB and atrial fib.  She was in the hospital in June 2021 with respiratory distress.   She was treated for multifactorial respiratory failure.  She had sleep apnea and hypoventilation obesity syndrome.  She was also thought to have acute systolic and diastolic HF.    Her EF was 45 - 50% in May.  This was not changed from previous.      Since I last saw her she has done very well.  She rarely notices her fibrillation. The patient denies any new symptoms such as chest discomfort, neck or arm discomfort. There has been no new shortness of breath, PND or orthopnea. There have been no reported palpitations, presyncope or syncope.   Past Medical History:  Diagnosis Date   Arthritis    Asthma    Diabetes mellitus without complication ([place])    TYPE II no meds   Eczema    Fibroid    Hypertension    Osteoarthritis    Ovarian neoplasm    PRODUCING TESTOSTERONE   Sleep apnea    BiPAP with O2   Submucous myoma of uterus     Past Surgical History:  Procedure Laterality Date   CARDIOVERSION N/A 10/05/2019   Procedure: CARDIOVERSION;  Surgeon: Fay Records, MD;  Location: [place];  Service: Cardiovascular;  Laterality: N/A;   CARPAL TUNNEL RELEASE Left    CARPAL TUNNEL RELEASE Left 03/23/2015   Procedure: LEFT CARPAL TUNNEL RELEASE;  Surgeon: Leandrew Koyanagi, MD;  Location: [place];  Service: Orthopedics;  Laterality: Left;   DERMOID CYST  EXCISION     HERNIA REPAIR     X2   HYSTEROSCOPY     D & C   PELVIC LAPAROSCOPY       Current Outpatient Medications  Medication Sig Dispense Refill   acetaminophen (TYLENOL) 500 MG tablet  Take 1,000 mg by mouth every 8 (eight) hours as needed for mild pain or moderate pain.     ADVAIR DISKUS 500-50 MCG/DOSE AEPB Inhale 1 puff into the lungs 2 (two) times daily.     albuterol (VENTOLIN HFA) 108 (90 Base) MCG/ACT inhaler Inhale 1-2 puffs into the lungs 2 (two) times daily as needed for shortness of breath.      Clobetasol Prop Emollient Base 0.05 % emollient cream Apply 1 application topically daily as needed (eczema).      colchicine 0.6 MG tablet Take 0.6 mg by mouth as needed.     diclofenac sodium (VOLTAREN) 1 % GEL Apply 2 g topically 2 (two) times daily.     diltiazem (CARDIZEM CD) 120 MG 24 hr capsule TAKE 1 CAPSULE BY MOUTH EVERY DAY 90 capsule 2   ELIQUIS 5 MG TABS tablet Take 1 tablet (5 mg total) by mouth 2 (two) times daily. 180 tablet 1   furosemide (LASIX) 80 MG tablet Take 80 mg by mouth.     levalbuterol (XOPENEX) 1.25 MG/0.5ML nebulizer solution Take 0.63 mg by nebulization every 6 (six) hours as needed for wheezing or shortness of breath. 1 each 12   metFORMIN (GLUCOPHAGE) 1000 MG tablet Take  500-1,000 mg by mouth See admin instructions. Take 1000 mg in the morning and 500 mg in the evening     metoprolol tartrate (LOPRESSOR) 100 MG tablet TAKE 1 TABLET BY MOUTH TWICE A DAY 180 tablet 3   nystatin cream (MYCOSTATIN) Apply 1 application topically daily as needed (yeast under breast).      No current facility-administered medications for this visit.    Allergies:   Tylenol arthritis ext [acetaminophen]    ROS:  Please see the history of present illness.   Otherwise, review of systems are positive for none.   All other systems are reviewed and negative.    PHYSICAL EXAM: VS:  BP 120/79   Pulse 75   Ht 5' (1.524 m)   Wt 260 lb 12.8 oz (118.3 kg)   SpO2 95%   BMI 50.93 kg/m  , BMI Body mass index is 50.93 kg/m. GENERAL:  Well appearing NECK:  No jugular venous distention, waveform within normal limits, carotid upstroke brisk and symmetric, no bruits, no  thyromegaly LUNGS:  Clear to auscultation bilaterally CHEST:  Unremarkable HEART:  PMI not displaced or sustained,S1 and S2 within normal limits, no S3, no clicks, no rubs, no murmurs, irregular  ABD:  Flat, positive bowel sounds normal in frequency in pitch, no bruits, no rebound, no guarding, no midline pulsatile mass, no hepatomegaly, no splenomegaly EXT:  2 plus pulses throughout, no edema, no cyanosis no clubbing   EKG:  EKG is  ordered today. Atrial fibrillation, rate 75, axis within normal limits, intervals within normal limits, poor anterior R wave progression, premature ventricular contraction, no acute ST-T wave changes.  Low voltage in the limb and chest leads  Recent Labs: 11/07/2021: BUN 23; Creatinine, Ser 1.12; Hemoglobin 14.0; Platelets 223; Potassium 4.5; Sodium 143    Lipid Panel No results found for: CHOL, TRIG, HDL, CHOLHDL, VLDL, LDLCALC, LDLDIRECT    Wt Readings from Last 3 Encounters:  05/08/22 260 lb 12.8 oz (118.3 kg)  03/28/22 264 lb (119.7 kg)  02/14/22 264 lb (119.7 kg)      Other studies Reviewed: Additional studies/ records that were reviewed today include:   None Review of the above records demonstrates:    NA   ASSESSMENT AND PLAN:  CHRONIC SYSTOLIC AND DIASTOLIC HF:    Patient seems to be euvolemic.  She has low normal ejection fraction.  No change in therapy.  CHRONIC ATRIAL FIB:  Amy Riddle has a CHA2DS2 - VASc score of 4.    She tolerates anticoagulation and has good rate control.  No change in therapy.  She had blood work to include a CBC and a basic metabolic profile by her primary provider and she says these were fine.  I like to get these results and she will try to send them over.   SLEEP APNEA:    She has a new BiPAP that works very well.  No change in therapy.   MORBID OBESITY: We have had previous long conversations about this.  No change in therapy.  Current medicines are reviewed at length with the patient today.  The  patient does not have concerns regarding medicines.  The following changes have been made: None  Labs/ tests ordered today include:   None  Orders Placed This Encounter  Procedures   EKG 12-Lead    Disposition:   FU with me in 12 months   Signed, Minus Breeding, MD  05/08/2022 1:39 PM    [facility]

## 2022-05-08 ENCOUNTER — Ambulatory Visit (INDEPENDENT_AMBULATORY_CARE_PROVIDER_SITE_OTHER): Payer: No Typology Code available for payment source | Admitting: Cardiology

## 2022-05-08 ENCOUNTER — Encounter: Payer: Self-pay | Admitting: Cardiology

## 2022-05-08 VITALS — BP 120/79 | HR 75 | Ht 60.0 in | Wt 260.8 lb

## 2022-05-08 DIAGNOSIS — I4821 Permanent atrial fibrillation: Secondary | ICD-10-CM | POA: Diagnosis not present

## 2022-05-08 DIAGNOSIS — I5042 Chronic combined systolic (congestive) and diastolic (congestive) heart failure: Secondary | ICD-10-CM

## 2022-05-08 NOTE — Patient Instructions (Signed)
Medication Instructions:  Your physician recommends that you continue on your current medications as directed. Please refer to the Current Medication list given to you today.  *If you need a refill on your cardiac medications before your next appointment, please call your pharmacy*  Follow-Up: At [facility], you and your health needs are our priority.  As part of our continuing mission to provide you with exceptional heart care, we have created designated Provider Care Teams.  These Care Teams include your primary Cardiologist (physician) and Advanced Practice Providers (APPs -  Physician Assistants and Nurse Practitioners) who all work together to provide you with the care you need, when you need it.  We recommend signing up for the patient portal called "MyChart".  Sign up information is provided on this After Visit Summary.  MyChart is used to connect with patients for Virtual Visits (Telemedicine).  Patients are able to view lab/test results, encounter notes, upcoming appointments, etc.  Non-urgent messages can be sent to your provider as well.   To learn more about what you can do with MyChart, go to [email or web address].    Your next appointment:   12 month(s)  The format for your next appointment:   In Person  Provider:   Minus Breeding, MD {   Important Information About Sugar

## 2022-06-04 ENCOUNTER — Other Ambulatory Visit: Payer: Self-pay | Admitting: Cardiology

## 2022-08-14 ENCOUNTER — Ambulatory Visit (INDEPENDENT_AMBULATORY_CARE_PROVIDER_SITE_OTHER): Payer: No Typology Code available for payment source | Admitting: Pulmonary Disease

## 2022-08-14 ENCOUNTER — Encounter: Payer: Self-pay | Admitting: Pulmonary Disease

## 2022-08-14 ENCOUNTER — Ambulatory Visit (INDEPENDENT_AMBULATORY_CARE_PROVIDER_SITE_OTHER): Payer: No Typology Code available for payment source

## 2022-08-14 VITALS — BP 136/92 | HR 95 | Ht 62.0 in | Wt 259.2 lb

## 2022-08-14 DIAGNOSIS — J45998 Other asthma: Secondary | ICD-10-CM

## 2022-08-14 DIAGNOSIS — J189 Pneumonia, unspecified organism: Secondary | ICD-10-CM | POA: Diagnosis not present

## 2022-08-14 DIAGNOSIS — J9612 Chronic respiratory failure with hypercapnia: Secondary | ICD-10-CM | POA: Diagnosis not present

## 2022-08-14 DIAGNOSIS — J9611 Chronic respiratory failure with hypoxia: Secondary | ICD-10-CM | POA: Diagnosis not present

## 2022-08-14 MED ORDER — AZITHROMYCIN 250 MG PO TABS: 250.0000 mg | ORAL_TABLET | Freq: Every day | ORAL | 0 refills | Status: DC

## 2022-08-14 NOTE — Progress Notes (Signed)
   Subjective:    Patient ID: REDONNA WILBERT, female    DOB: Dec 07, 1945, 76 y.o.   MRN: [id number]  HPI  76 yo  Remote smoker with OSA/OHS & chronic respiratory failure, on BiPAP +2 L oxygen. CODE STATUS DNR   PMH - A-Fib - on eliquis, cardizem.  s/p  cardioversion   HTN - on losartan, lasix.  Prior LVEF 40-45% Asthma - on advair  DM II - on metformin  Morbid Obesity  Former Smoker - quit 1975   She was hospitalized 05/2020, acute hypercarbic respiratory failure, AKI and cor pulmonale   ABG  7.2 5/82/74 on 60% FiO2 she was discharged on 3 L of oxygen and trilogy Switched to BiPAP 11/2020 after sleep studies  Last office visit 01/2022 she complained of bloating.  She feels this has improved. However for the past 8 days she has developed a cough with clear sputum production.  She has to sleep in a recliner due to excessive cough, no fevers or sick contacts.  She did go to a baby shower prior to onset of symptoms. She needs refills on Advair and albuterol.    Significant tests/ events reviewed  Limited ECHO 05/2020 >> LVEF ~ 40-45%, LV mildly decreased function, LV demonstrates global hypokinesis, interventricular septum flattened in systole consistent with RV pressure overload, RV systolic function moderately reduced, RV is severely enlarged, LA mildly dilated, RA severely dilated    NPSG 08/2020 mod OSA AHI 19/h with severe desatn , nadir 66%, required 2L O2   Titration 10/2020 >> BiPAP 20/16 , nadir 79% 01/2021 ONO on BiPAP/room air showed desaturation for 28 minutes on night #1 However on nights#2 and 3, minimal desaturation was noted  Review of Systems neg for any significant sore throat, dysphagia, itching, sneezing, nasal congestion or excess/ purulent secretions, fever, chills, sweats, unintended wt loss, pleuritic or exertional cp, hempoptysis, orthopnea pnd or change in chronic leg swelling. Also denies presyncope, palpitations, heartburn, abdominal pain, nausea,  vomiting, diarrhea or change in bowel or urinary habits, dysuria,hematuria, rash, arthralgias, visual complaints, headache, numbness weakness or ataxia.     Objective:   Physical Exam  Gen. Pleasant, obese, in no distress ENT - no lesions, no post nasal drip Neck: No JVD, no thyromegaly, no carotid bruits Lungs: no use of accessory muscles, no dullness to percussion, rt basal rales no rhonchi , wet sounding cough Cardiovascular: Rhythm regular, heart sounds  normal, no murmurs or gallops, no peripheral edema Musculoskeletal: No deformities, no cyanosis or clubbing , no tremors       Assessment & Plan:   Acute bronchitis -given her prior history we obtained chest x-ray which does not show any infiltrates. We will treat her with Z-Pak Mucinex can be used as needed

## 2022-08-14 NOTE — Assessment & Plan Note (Signed)
Continue Advair with albuterol for rescue

## 2022-08-14 NOTE — Assessment & Plan Note (Signed)
BiPAP download was reviewed which shows excellent compliance and good control of events with average pressure of 16/12 cm.  BiPAP is certainly helped decrease her hospitalizations and improve her breathing.  Apnea seem to be well controlled  Weight loss encouraged, compliance with goal of at least 4-6 hrs every night is the expectation. Advised against medications with sedative side effects Cautioned against driving when sleepy - understanding that sleepiness will vary on a day to day basis

## 2022-08-14 NOTE — Addendum Note (Signed)
Addended by: Konrad Felix L on: 08/14/2022 02:07 PM   Modules accepted: Orders

## 2022-08-14 NOTE — Patient Instructions (Signed)
X CXR to r/o pneumonia  X antibiotic based on above  OK to take mucinex 600 mg twice daily x 5 days  X refills on advair & albuterol

## 2022-08-25 ENCOUNTER — Other Ambulatory Visit: Payer: Self-pay | Admitting: Cardiology

## 2022-08-27 NOTE — Telephone Encounter (Signed)
Prescription refill request for Eliquis received. Indication: afib  Last office visit: [place], 05/08/2022 Scr: 1.12, 11/07/2021 Age: 76 yo  Weight: 117.6 kg   Refill sent.

## 2022-08-28 ENCOUNTER — Other Ambulatory Visit: Payer: Self-pay

## 2022-08-28 MED ORDER — DILTIAZEM HCL ER COATED BEADS 120 MG PO CP24: ORAL_CAPSULE | ORAL | 3 refills | Status: DC

## 2023-01-02 ENCOUNTER — Telehealth: Payer: Self-pay | Admitting: Cardiology

## 2023-01-02 NOTE — Telephone Encounter (Signed)
Patient called and would like for nurse to call back about questions she has regarding medication

## 2023-01-02 NOTE — Telephone Encounter (Signed)
Spoke with pt, she wanted to make sure it was okay for her to take elderberry. Okay given once checking with the pharmacist.

## 2023-02-13 ENCOUNTER — Encounter (HOSPITAL_BASED_OUTPATIENT_CLINIC_OR_DEPARTMENT_OTHER): Payer: Self-pay | Admitting: Pulmonary Disease

## 2023-02-13 ENCOUNTER — Ambulatory Visit (HOSPITAL_BASED_OUTPATIENT_CLINIC_OR_DEPARTMENT_OTHER): Payer: No Typology Code available for payment source | Admitting: Pulmonary Disease

## 2023-02-13 VITALS — BP 128/76 | HR 86 | Temp 98.2°F | Ht 62.0 in | Wt 247.9 lb

## 2023-02-13 DIAGNOSIS — J9612 Chronic respiratory failure with hypercapnia: Secondary | ICD-10-CM

## 2023-02-13 DIAGNOSIS — G4733 Obstructive sleep apnea (adult) (pediatric): Secondary | ICD-10-CM

## 2023-02-13 DIAGNOSIS — J9611 Chronic respiratory failure with hypoxia: Secondary | ICD-10-CM | POA: Diagnosis not present

## 2023-02-13 DIAGNOSIS — J45998 Other asthma: Secondary | ICD-10-CM

## 2023-02-13 NOTE — Progress Notes (Signed)
   Subjective:    Patient ID: BELL CARBO, female    DOB: May 15, 1946, 77 y.o.   MRN: [id number]  HPI  77  yo  Remote smoker with OSA/OHS & chronic respiratory failure, on BiPAP +2 L oxygen. CODE STATUS DNR   PMH - A-Fib - on eliquis, cardizem.  s/p  cardioversion   HTN - on losartan, lasix.  Prior LVEF 40-45% Asthma - on advair  DM II - on metformin  Morbid Obesity  Former Smoker - quit 1975   She was hospitalized 05/2020, acute hypercarbic respiratory failure, AKI and cor pulmonale   ABG  7.2 5/82/74 on 60% FiO2 she was discharged on 3 L of oxygen and trilogy Switched to BiPAP 11/2020 after sleep studies  Chief Complaint  Patient presents with   Follow-up    Pt states she has been doing okay since last visit and denies any complaints.   71-month follow-up visit. She reports had cold like symptoms with nasal congestion not sure if it is allergies Previously when she has received Z-Pak, and it knocks out a chest cold but causes pedal edema. Due to insurance reasons, Advair was changed to Symbicort, she has not started this yet. Denies wheezing or pedal edema or orthopnea  Compliant with BiPAP and oxygen, denies any problems with mask or pressure  Significant tests/ events reviewed   Limited ECHO 05/2020 >> LVEF ~ 40-45%, LV mildly decreased function, LV demonstrates global hypokinesis, interventricular septum flattened in systole consistent with RV pressure overload, RV systolic function moderately reduced, RV is severely enlarged, LA mildly dilated, RA severely dilated     NPSG 08/2020 mod OSA AHI 19/h with severe desatn , nadir 66%, required 2L O2   Titration 10/2020 >> BiPAP 20/16 , nadir 79% 01/2021 ONO on BiPAP/room air showed desaturation for 28 minutes on night #1 However on nights#2 and 3, minimal desaturation was noted  Review of Systems neg for any significant sore throat, dysphagia, itching, sneezing, nasal congestion or excess/ purulent secretions, fever,  chills, sweats, unintended wt loss, pleuritic or exertional cp, hempoptysis, orthopnea pnd or change in chronic leg swelling. Also denies presyncope, palpitations, heartburn, abdominal pain, nausea, vomiting, diarrhea or change in bowel or urinary habits, dysuria,hematuria, rash, arthralgias, visual complaints, headache, numbness weakness or ataxia.     Objective:   Physical Exam  Gen. Pleasant, obese, in no distress ENT - no lesions, no post nasal drip Neck: No JVD, no thyromegaly, no carotid bruits Lungs: no use of accessory muscles, no dullness to percussion, decreased without rales or rhonchi  Cardiovascular: Rhythm regular, heart sounds  normal, no murmurs or gallops, no peripheral edema Musculoskeletal: No deformities, no cyanosis or clubbing , no tremors       Assessment & Plan:

## 2023-02-13 NOTE — Assessment & Plan Note (Signed)
BiPAP download was reviewed which shows excellent control of events on auto BiPAP settings with average pressure 17/13 cm PAPS only helped improve her daytime somnolence and fatigue.  She is very compliant. Moderate leak is noted in spite of small to medium F&P ewora mask

## 2023-02-13 NOTE — Patient Instructions (Signed)
X call if you develop yellow green phlegm OK to take mucinex for head cold

## 2023-02-13 NOTE — Assessment & Plan Note (Signed)
Compliant with BiPAP and this is certainly helped. Recheck nocturnal oximetry on BiPAP/room air

## 2023-02-13 NOTE — Assessment & Plan Note (Signed)
Okay to switch from Advair to Symbicort since this is better covered by insurance. She will call us if she feels a difference in efficacy. She will use albuterol on an as-needed basis

## 2023-03-08 ENCOUNTER — Other Ambulatory Visit: Payer: Self-pay | Admitting: Cardiology

## 2023-03-08 DIAGNOSIS — I4821 Permanent atrial fibrillation: Secondary | ICD-10-CM

## 2023-03-08 NOTE — Telephone Encounter (Signed)
Prescription refill request for Eliquis received. Indication: a fib Last office visit: 05/08/22  Scr: 0.95 09/11/22 Age: 77 Weight: 112kg

## 2023-03-20 ENCOUNTER — Encounter: Payer: Self-pay | Admitting: Pulmonary Disease

## 2023-04-05 LAB — LAB REPORT - SCANNED
A1c: 6
Albumin, Urine POC: 53.9
Albumin/Creatinine Ratio, Urine, POC: 78
Creatinine, POC: 68.9 mg/dL
EGFR: 56

## 2023-04-08 ENCOUNTER — Telehealth: Payer: Self-pay | Admitting: Pulmonary Disease

## 2023-04-08 NOTE — Telephone Encounter (Signed)
ONO on BiPAP/room air showed minimal desaturation at 5 minutes Okay to stay off oxygen and we can discontinue this if she feels okay without it for a few weeks

## 2023-04-11 NOTE — Telephone Encounter (Signed)
Called and spoke with patient. Patient verbalized understanding. Nothing further needed.  

## 2023-04-25 ENCOUNTER — Telehealth (HOSPITAL_BASED_OUTPATIENT_CLINIC_OR_DEPARTMENT_OTHER): Payer: Self-pay | Admitting: Pulmonary Disease

## 2023-04-25 DIAGNOSIS — J45998 Other asthma: Secondary | ICD-10-CM

## 2023-04-25 NOTE — Telephone Encounter (Signed)
Patient was told to call back to tell us how she is doing without O2 for a few weeks. Patient states she is doing fine without O2 supplement. Please advise.

## 2023-04-26 NOTE — Telephone Encounter (Signed)
Routing to Dr. Alva as an FYI. 

## 2023-05-03 NOTE — Telephone Encounter (Signed)
Called and spoke with patient. Patient verbalized understanding. Order to DC oxygen has been placed.   Nothing further needed.

## 2023-05-05 NOTE — Progress Notes (Unsigned)
Cardiology Office Note:   Date:  05/07/2023  ID:  Amy Riddle, DOB [birth year], MRN [id number] PCP: Kaleen Mask, MD  [facility] Providers Cardiologist:  Rollene Rotunda, MD  }   History of Present Illness:   Amy Riddle is a 77 y.o. female who presents for follow up of SOB and atrial fib.  She was in the hospital in June 2021 with respiratory distress.   She was treated for multifactorial respiratory failure.  She had sleep apnea and hypoventilation obesity syndrome.  She was also thought to have acute systolic and diastolic HF.    Her EF was 45 - 50% in May.  This was not changed from previous.      Since I last saw her she said she has done well.  She rarely notices her fibrillation.  She does her household chores including vacuuming.  She states she is lost about 9 pounds.  She is trying to watch what she eats. The patient denies any new symptoms such as chest discomfort, neck or arm discomfort. There has been no new shortness of breath, PND or orthopnea. There have been no reported palpitations, presyncope or syncope.    ROS: As stated in the HPI and negative for all other systems.  Studies Reviewed:    EKG: Atrial fibrillation, rate 72, axis right axis deviation, poor anterior R wave progression, no acute ST-T wave changes.   Risk Assessment/Calculations:    CHA2DS2-VASc Score = 3   This indicates a 3.2% annual risk of stroke. The patient's score is based upon: CHF History: 1 HTN History: 0 Diabetes History: 0 Stroke History: 0 Vascular Disease History: 0 Age Score: 1 Gender Score: 1       Physical Exam:   VS:  BP 128/62 (BP Location: Left Arm, Patient Position: Sitting, Cuff Size: Large)   Pulse 83   Wt 216 lb 9.6 oz (98.2 kg)   SpO2 95%   BMI 39.62 kg/m    Wt Readings from Last 3 Encounters:  05/07/23 216 lb 9.6 oz (98.2 kg)  02/13/23 247 lb 14.5 oz (112.5 kg)  08/14/22 259 lb 3.2 oz (117.6 kg)     GEN: Well nourished, well  developed in no acute distress NECK: No JVD; No carotid bruits CARDIAC: Irregular RR, no murmurs, rubs, gallops RESPIRATORY:  Clear to auscultation without rales, wheezing or rhonchi  ABDOMEN: Soft, non-tender, non-distended EXTREMITIES:  No edema; No deformity   ASSESSMENT AND PLAN:   CHRONIC SYSTOLIC AND DIASTOLIC HF:    The patient does have mildly reduced ejection fraction and I will repeat an echocardiogram.  She was given a prescription to start Cozaar but she had not yet started this and I do encourage that she starts 50 mg daily.  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  I need to exclude other etiologies.   CHRONIC ATRIAL FIB:  Amy Riddle has a CHA2DS2 - VASc score of 4.    She tolerates anticoagulation.  No change in therapy.  SLEEP APNEA:   She uses BiPAP.  Oxygen actually was just stopped.  No change in therapy.  MORBID OBESITY:   She is lost 9 pounds and I encouraged more the same.    DM: I do see that her A1c was  6.0.  This is managed per Kaleen Mask, MD         Signed, Rollene Rotunda, MD

## 2023-05-07 ENCOUNTER — Ambulatory Visit: Payer: No Typology Code available for payment source | Attending: Cardiology | Admitting: Cardiology

## 2023-05-07 ENCOUNTER — Encounter: Payer: Self-pay | Admitting: Cardiology

## 2023-05-07 VITALS — BP 128/62 | HR 83 | Wt 216.6 lb

## 2023-05-07 DIAGNOSIS — I5042 Chronic combined systolic (congestive) and diastolic (congestive) heart failure: Secondary | ICD-10-CM | POA: Diagnosis not present

## 2023-05-07 DIAGNOSIS — I4821 Permanent atrial fibrillation: Secondary | ICD-10-CM | POA: Diagnosis not present

## 2023-05-07 NOTE — Patient Instructions (Signed)
  Testing/Procedures: Your physician has requested that you have an echocardiogram. Echocardiography is a painless test that uses sound waves to create images of your heart. It provides your doctor with information about the size and shape of your heart and how well your heart's chambers and valves are working. This procedure takes approximately one hour. There are no restrictions for this procedure. Please do NOT wear cologne, perfume, aftershave, or lotions (deodorant is allowed). Please arrive 15 minutes prior to your appointment time. [address]   Follow-Up: At [facility], you and your health needs are our priority.  As part of our continuing mission to provide you with exceptional heart care, we have created designated Provider Care Teams.  These Care Teams include your primary Cardiologist (physician) and Advanced Practice Providers (APPs -  Physician Assistants and Nurse Practitioners) who all work together to provide you with the care you need, when you need it.  We recommend signing up for the patient portal called "MyChart".  Sign up information is provided on this After Visit Summary.  MyChart is used to connect with patients for Virtual Visits (Telemedicine).  Patients are able to view lab/test results, encounter notes, upcoming appointments, etc.  Non-urgent messages can be sent to your provider as well.   To learn more about what you can do with MyChart, go to [email or web address].    Your next appointment:   6 month(s)  Provider:   James Hochrein, MD      

## 2023-06-11 ENCOUNTER — Ambulatory Visit (HOSPITAL_COMMUNITY): Payer: No Typology Code available for payment source | Attending: Cardiology

## 2023-06-11 DIAGNOSIS — I5042 Chronic combined systolic (congestive) and diastolic (congestive) heart failure: Secondary | ICD-10-CM | POA: Insufficient documentation

## 2023-06-11 LAB — ECHOCARDIOGRAM COMPLETE: S' Lateral: 3.4 cm

## 2023-06-11 MED ORDER — PERFLUTREN LIPID MICROSPHERE
1.00 mL | INTRAVENOUS | Status: AC | PRN
Start: 2023-06-11 — End: 2023-06-11
  Administered 2023-06-11: 2 mL via INTRAVENOUS

## 2023-06-21 ENCOUNTER — Other Ambulatory Visit: Payer: Self-pay | Admitting: Cardiology

## 2023-08-28 ENCOUNTER — Other Ambulatory Visit: Payer: Self-pay | Admitting: Cardiology

## 2023-09-30 ENCOUNTER — Other Ambulatory Visit: Payer: Self-pay | Admitting: Cardiology

## 2023-09-30 DIAGNOSIS — I4821 Permanent atrial fibrillation: Secondary | ICD-10-CM

## 2023-09-30 NOTE — Telephone Encounter (Signed)
Prescription refill request for Eliquis received. Indication:afib Last office visit:6/24 Scr:1.04  6/24 Age: 77 Weight:98.2  kg  Prescription refilled

## 2023-10-25 ENCOUNTER — Ambulatory Visit (HOSPITAL_BASED_OUTPATIENT_CLINIC_OR_DEPARTMENT_OTHER): Payer: No Typology Code available for payment source | Admitting: Pulmonary Disease

## 2023-10-25 ENCOUNTER — Encounter (HOSPITAL_BASED_OUTPATIENT_CLINIC_OR_DEPARTMENT_OTHER): Payer: Self-pay | Admitting: Pulmonary Disease

## 2023-10-25 VITALS — BP 144/82 | HR 74 | Ht 62.0 in | Wt 257.0 lb

## 2023-10-25 DIAGNOSIS — J9612 Chronic respiratory failure with hypercapnia: Secondary | ICD-10-CM

## 2023-10-25 DIAGNOSIS — G4733 Obstructive sleep apnea (adult) (pediatric): Secondary | ICD-10-CM

## 2023-10-25 DIAGNOSIS — J9611 Chronic respiratory failure with hypoxia: Secondary | ICD-10-CM

## 2023-10-25 DIAGNOSIS — I5042 Chronic combined systolic (congestive) and diastolic (congestive) heart failure: Secondary | ICD-10-CM | POA: Diagnosis not present

## 2023-10-25 MED ORDER — LEVALBUTEROL HCL 1.25 MG/0.5ML IN NEBU
0.6300 mg | INHALATION_SOLUTION | Freq: Four times a day (QID) | RESPIRATORY_TRACT | 12 refills | Status: DC | PRN
Start: 2023-10-25 — End: 2023-11-18

## 2023-10-25 NOTE — Assessment & Plan Note (Signed)
Continue Lasix half tablet/40 mg in the morning. I asked her to take evening dose at 4 PM

## 2023-10-25 NOTE — Assessment & Plan Note (Signed)
Continue oxygen blended into BiPAP during sleep

## 2023-10-25 NOTE — Assessment & Plan Note (Signed)
BiPAP download was reviewed which shows excellent control of events on auto settings with average pressure 16/12 cm.  She is very compliant without a single missed night, average close to 6 hours per night.  She does have a large leak. She complains about pressure being high and I asked her to increase her ramp time to up to 30 minutes as needed.  If this is still an issue, we can lower IPAP maximum to 16 cm Weight loss encouraged, compliance with goal of at least 4-6 hrs every night is the expectation. Advised against medications with sedative side effects Cautioned against driving when sleepy - understanding that sleepiness will vary on a day to day basis

## 2023-10-25 NOTE — Patient Instructions (Signed)
x refill on Xopenex nebs # 30  BiPAP is working well. Increase ramp time to 15 to 30 minutes as needed If pressure still a problem, call us back and we can lower

## 2023-10-25 NOTE — Progress Notes (Signed)
   Subjective:    Patient ID: Amy Riddle, female    DOB: 1946/08/04, 77 y.o.   MRN: [id number]  HPI  77 yo  Remote smoker with OSA/OHS & chronic respiratory failure, on BiPAP +2 L oxygen. CODE STATUS DNR   PMH - A-Fib - on eliquis, cardizem.  s/p  cardioversion   HTN - on losartan, lasix.  Prior LVEF 40-45% Asthma - on advair  DM II - on metformin  Morbid Obesity  Former Smoker - quit 1975   She was hospitalized 05/2020, acute hypercarbic respiratory failure, AKI and cor pulmonale   ABG  7.2 5/82/74 on 60% FiO2 she was discharged on 3 L of oxygen and trilogy Switched to BiPAP 11/2020 after sleep studies  8-month follow-up visit. She is compliant with Symbicort, dyspnea is at baseline.  She needs refill on Xopenex nebs. Reviewed follow-up with cardiology and repeat echo showing EF 40 to 45%, dilated RV and RA but normal RV SF which is improved from prior echo.  She is compliant with BiPAP machine and denies any problems with mask.  She does report pressure being high.  The mask leaves pressure marks on her face and the air turns on too high  Significant tests/ events reviewed   Limited ECHO 05/2020 >> LVEF ~ 40-45%, LV mildly decreased function, LV demonstrates global hypokinesis, interventricular septum flattened in systole consistent with RV pressure overload, RV systolic function moderately reduced, RV is severely enlarged, LA mildly dilated, RA severely dilated     NPSG 08/2020 mod OSA AHI 19/h with severe desatn , nadir 66%, required 2L O2   Titration 10/2020 >> BiPAP 20/16 , nadir 79% 01/2021 ONO on BiPAP/room air showed desaturation for 28 minutes on night #1 However on nights#2 and 3, minimal desaturation was noted  Review of Systems neg for any significant sore throat, dysphagia, itching, sneezing, nasal congestion or excess/ purulent secretions, fever, chills, sweats, unintended wt loss, pleuritic or exertional cp, hempoptysis, orthopnea pnd or change in chronic  leg swelling. Also denies presyncope, palpitations, heartburn, abdominal pain, nausea, vomiting, diarrhea or change in bowel or urinary habits, dysuria,hematuria, rash, arthralgias, visual complaints, headache, numbness weakness or ataxia.     Objective:   Physical Exam  Gen. Pleasant, obese, in no distress ENT - no lesions, no post nasal drip Neck: No JVD, no thyromegaly, no carotid bruits Lungs: no use of accessory muscles, no dullness to percussion, decreased without rales or rhonchi  Cardiovascular: Rhythm regular, heart sounds  normal, no murmurs or gallops, 1+ peripheral edema Musculoskeletal: No deformities, no cyanosis or clubbing , no tremors       Assessment & Plan:

## 2023-10-25 NOTE — Assessment & Plan Note (Signed)
She would be a good candidate for GLP-1 agonist.  HbA1c is controlled with metformin

## 2023-11-04 NOTE — Progress Notes (Unsigned)
  Cardiology Office Note:   Date:  11/07/2023  ID:  Amy Riddle, DOB 05/06/1946, MRN [id number] PCP: Kaleen Mask, MD  [facility] Providers Cardiologist:  Rollene Rotunda, MD {  History of Present Illness:   Amy Riddle is a 77 y.o. female who presents for follow up of SOB and atrial fib.  She was in the hospital in June 2021 with respiratory distress.   She was treated for multifactorial respiratory failure.  She had sleep apnea and hypoventilation obesity syndrome.  She was also thought to have acute systolic and diastolic HF.    Her EF was 45 - 50% most recently in Sept.  This was not changed from previous.       Since I last saw her done okay.  She does her household.  She vacuums.  She does not have any new cardiovascular symptoms of shortness of breath, PND or orthopnea.  She does not have any new palpitations, presyncope or syncope.  She denies any chest pressure, neck or arm discomfort.  She has had no weight gain or edema.  Her weights have been relatively steady.  She does not really notice her permanent atrial fibrillation.   ROS: As stated in the HPI and negative for all other systems.  Studies Reviewed:    EKG:   EKG Interpretation Date/Time:  Thursday November 07 2023 10:27:27 EST Ventricular Rate:  74 PR Interval:    QRS Duration:  96 QT Interval:  394 QTC Calculation: 437 R Axis:   125  Text Interpretation: Atrial fibrillation Right axis deviation Nonspecific ST abnormality When compared with ECG of 12-May-2020 10:19, Vent. rate has decreased BY  39 BPM Nonspecific T wave abnormality no longer evident in Inferior leads Confirmed by Rollene Rotunda ([id number]) on 11/07/2023 10:35:00 AM     Risk Assessment/Calculations:    CHA2DS2-VASc Score = 3   This indicates a 3.2% annual risk of stroke. The patient's score is based upon:   Physical Exam:   VS:  BP 132/84   Pulse 74   Ht 5' 2.5" (1.588 m)   Wt 246 lb (111.6 kg)   SpO2 96%   BMI  44.28 kg/m    Wt Readings from Last 3 Encounters:  11/07/23 246 lb (111.6 kg)  10/25/23 257 lb (116.6 kg)  05/07/23 216 lb 9.6 oz (98.2 kg)     GEN: Well nourished, well developed in no acute distress NECK: No JVD; No carotid bruits CARDIAC: Irregular RR, no murmurs, rubs, gallops RESPIRATORY:  Clear to auscultation without rales, wheezing or rhonchi  ABDOMEN: Soft, non-tender, non-distended EXTREMITIES:  No edema; No deformity   ASSESSMENT AND PLAN:   CHRONIC SYSTOLIC AND DIASTOLIC HF:     She seems to be euvolemic.  No change in therapy.  CHRONIC ATRIAL FIB:  Amy Riddle has a CHA2DS2 - VASc score of 4.   She tolerates anticoagulation.  She has had good rate control.  No change in therapy.  SLEEP APNEA:   She uses BiPAP.     MORBID OBESITY:   We have continued to encourage weight loss with diet and exercise.  She is at least holding steady.  DM: I do see that her A1c was 5.9.  No change in therapy.  This is managed by Kaleen Mask, MD       Follow up with me in one year.   Signed, Rollene Rotunda, MD

## 2023-11-07 ENCOUNTER — Encounter: Payer: Self-pay | Admitting: Cardiology

## 2023-11-07 ENCOUNTER — Ambulatory Visit: Payer: No Typology Code available for payment source | Attending: Cardiology | Admitting: Cardiology

## 2023-11-07 VITALS — BP 132/84 | HR 74 | Ht 62.5 in | Wt 246.0 lb

## 2023-11-07 DIAGNOSIS — G4730 Sleep apnea, unspecified: Secondary | ICD-10-CM | POA: Diagnosis not present

## 2023-11-07 DIAGNOSIS — E118 Type 2 diabetes mellitus with unspecified complications: Secondary | ICD-10-CM | POA: Diagnosis not present

## 2023-11-07 DIAGNOSIS — I5042 Chronic combined systolic (congestive) and diastolic (congestive) heart failure: Secondary | ICD-10-CM | POA: Diagnosis not present

## 2023-11-07 DIAGNOSIS — I482 Chronic atrial fibrillation, unspecified: Secondary | ICD-10-CM

## 2023-11-07 NOTE — Patient Instructions (Signed)
 Medication Instructions:  No changes.  *If you need a refill on your cardiac medications before your next appointment, please call your pharmacy*   Follow-Up: At [facility], you and your health needs are our priority.  As part of our continuing mission to provide you with exceptional heart care, we have created designated Provider Care Teams.  These Care Teams include your primary Cardiologist (physician) and Advanced Practice Providers (APPs -  Physician Assistants and Nurse Practitioners) who all work together to provide you with the care you need, when you need it.  We recommend signing up for the patient portal called "MyChart".  Sign up information is provided on this After Visit Summary.  MyChart is used to connect with patients for Virtual Visits (Telemedicine).  Patients are able to view lab/test results, encounter notes, upcoming appointments, etc.  Non-urgent messages can be sent to your provider as well.   To learn more about what you can do with MyChart, go to [email or web address].    Your next appointment:   12 month(s)  Provider:   Rollene Rotunda, MD

## 2023-11-08 ENCOUNTER — Telehealth: Payer: Self-pay | Admitting: Pulmonary Disease

## 2023-11-08 DIAGNOSIS — J9611 Chronic respiratory failure with hypoxia: Secondary | ICD-10-CM

## 2023-11-08 NOTE — Telephone Encounter (Signed)
Patient states does not want the concentrated Levalbuterol solution. Needs the liquid Levalbuterol. Pharmacy is [facility] [address] Patient phone number is [phone].

## 2023-11-08 NOTE — Telephone Encounter (Signed)
Unsure what medication she is needing. LMOM for pt to return call to clinic.

## 2023-11-14 NOTE — Telephone Encounter (Signed)
Left message on VM for patient to call clinic

## 2023-11-18 ENCOUNTER — Other Ambulatory Visit: Payer: Self-pay | Admitting: Pulmonary Disease

## 2023-11-18 DIAGNOSIS — J9611 Chronic respiratory failure with hypoxia: Secondary | ICD-10-CM

## 2023-11-18 MED ORDER — LEVALBUTEROL HCL 1.25 MG/0.5ML IN NEBU
0.6300 mg | INHALATION_SOLUTION | Freq: Four times a day (QID) | RESPIRATORY_TRACT | 12 refills | Status: DC | PRN
Start: 2023-11-18 — End: 2023-11-19

## 2023-11-18 NOTE — Telephone Encounter (Signed)
Per pharmacy they did not receive Xopenex rx from 10/25/23. This has been reordered. They will contact patient for pick up. Nothing further needed at this time.

## 2023-11-21 ENCOUNTER — Telehealth: Payer: Self-pay | Admitting: Pulmonary Disease

## 2023-11-21 DIAGNOSIS — J9611 Chronic respiratory failure with hypoxia: Secondary | ICD-10-CM

## 2023-11-21 DIAGNOSIS — J45998 Other asthma: Secondary | ICD-10-CM

## 2023-11-21 NOTE — Telephone Encounter (Signed)
Please see last encounter.   Patient still states that she received the concentrated albuterol instead of the pre-mixed solution(levobuteol solution). You may have to call the pharmacy in order to straighten thing out with the prescription.   [facility] [address]

## 2023-11-23 ENCOUNTER — Other Ambulatory Visit: Payer: Self-pay | Admitting: Pulmonary Disease

## 2023-11-23 DIAGNOSIS — J9611 Chronic respiratory failure with hypoxia: Secondary | ICD-10-CM

## 2023-12-05 NOTE — Telephone Encounter (Signed)
 Dr Vassie Loll is there a reason Levalbutero is being order as 1.25 mg instead of 0.63. Direction is stating to use 0.63 but it's ordered differently? I have pended order for Levalbuterol 0.63.

## 2023-12-05 NOTE — Telephone Encounter (Signed)
 Patient states she finally got this situation handled and now has a three month supply of her pre-mixed solution. Nothing further needed

## 2023-12-10 ENCOUNTER — Telehealth: Payer: Self-pay | Admitting: Pulmonary Disease

## 2023-12-10 MED ORDER — AZITHROMYCIN 250 MG PO TABS: ORAL_TABLET | ORAL | 0 refills | Status: DC

## 2023-12-10 NOTE — Telephone Encounter (Signed)
 Patient states having symptoms of cough and mucus. No available appointments at this time. Pharmacy is [facility] [address] Needs generic antibx. Patient phone number is [phone].

## 2023-12-10 NOTE — Telephone Encounter (Signed)
 Zpak sent in. Patient advised.

## 2023-12-10 NOTE — Telephone Encounter (Signed)
 Patient has deep productive cough.  Cough is severe.  No fever.  Started out as sinusitis at Christmas and cough started yesterday.  Congestion more now in chest x 2 days.  Patient wants to know what OTC meds Dr. Alva recommends for the cough for patients that have hypertension and also would like a generic antibiotic called into pharmacy, [facility] [address]  Allergies  Allergen Reactions   Tylenol  Arthritis Ext [Acetaminophen ] Other (See Comments)    Does not help

## 2024-02-19 ENCOUNTER — Other Ambulatory Visit: Payer: Self-pay | Admitting: Cardiology

## 2024-02-19 DIAGNOSIS — I4821 Permanent atrial fibrillation: Secondary | ICD-10-CM

## 2024-02-20 NOTE — Telephone Encounter (Signed)
 Prescription refill request for Eliquis received. Indication: Afib  Last office visit: 11/07/23 (Hochrein)  Scr: 0.95 (09/18/23 via [facility])  Age: 78 Weight: 111.6kg  Appropriate dose. Refill sent.

## 2024-04-09 ENCOUNTER — Ambulatory Visit: Payer: Self-pay

## 2024-04-09 DIAGNOSIS — J209 Acute bronchitis, unspecified: Secondary | ICD-10-CM

## 2024-04-09 MED ORDER — AZITHROMYCIN 250 MG PO TABS: ORAL_TABLET | ORAL | 0 refills | Status: DC

## 2024-04-09 NOTE — Telephone Encounter (Signed)
**Note De-identified  [place] Obfuscation** Please advise 

## 2024-04-09 NOTE — Telephone Encounter (Signed)
 Sent azithromycin  in (z pack). Advise her to use guaifenesin 600 mg Twice daily for congestion/cough. Needs f/u scheduled with Dr. Villa Greaser. Thanks.

## 2024-04-09 NOTE — Telephone Encounter (Signed)
 Copied from CRM [id number]. Topic: Clinical - Red Word Triage >> Apr 09, 2024 10:21 AM Juliana Ocean wrote: Red Word that prompted transfer to Nurse Triage: pt has Afib and got a cold that has gotten into her chest.  She says she gets every 6 mos.  She has gold/yellow mucus, eyes are running. Bad deep cough Usually zpack is prescribed by Dr Villa Greaser.   Chief Complaint: Cough, Cold Symptoms Symptoms: Cough, Nasal Congestion  Frequency: Since Tuesday  Pertinent Negatives: Patient denies  chest pain, dyspnea  Disposition: [] ED /[] Urgent Care (no appt availability in office) / [] Appointment(In office/virtual)/ []  [facility]/ [] Home Care/ [x] Refused Recommended Disposition /[]  Mobile Bus/ []  Follow-up with PCP Additional Notes: EC is being triaged for a cough that is acute in nature.   Been taking Coricidin for symptoms  Has not taken a test for COVID  Does not want to come in, only wants medication called in, [phone]   Reason for Disposition  [1] Continuous (nonstop) coughing interferes with work or school AND [2] no improvement using cough treatment per Care Advice  Answer Assessment - Initial Assessment Questions 1. ONSET: "When did the cough begin?"      Tuesday  2. SEVERITY: "How bad is the cough today?"      Intermittent  3. SPUTUM: "Describe the color of your sputum" (none, dry cough; clear, white, yellow, green)    Dark Yellow  4. HEMOPTYSIS: "Are you coughing up any blood?" If so ask: "How much?" (flecks, streaks, tablespoons, etc.)     No  5. DIFFICULTY BREATHING: "Are you having difficulty breathing?" If Yes, ask: "How bad is it?" (e.g., mild, moderate, severe)    - MILD: No SOB at rest, mild SOB with walking, speaks normally in sentences, can lie down, no retractions, pulse < 100.    - MODERATE: SOB at rest, SOB with minimal exertion and prefers to sit, cannot lie down flat, speaks in phrases, mild retractions, audible wheezing, pulse 100-120.    -  SEVERE: Very SOB at rest, speaks in single words, struggling to breathe, sitting hunched forward, retractions, pulse > 120      Mild  6. FEVER: "Do you have a fever?" If Yes, ask: "What is your temperature, how was it measured, and when did it start?"     1-2 Days, not currently  7. CARDIAC HISTORY: "Do you have any history of heart disease?" (e.g., heart attack, congestive heart failure)      Afib, CHF  8. LUNG HISTORY: "Do you have any history of lung disease?"  (e.g., pulmonary embolus, asthma, emphysema)     Asthma  9. PE RISK FACTORS: "Do you have a history of blood clots?" (or: recent major surgery, recent prolonged travel, bedridden)     No  10. OTHER SYMPTOMS: "Do you have any other symptoms?" (e.g., runny nose, wheezing, chest pain)       Nasal Congestion  12. TRAVEL: "Have you traveled out of the country in the last month?" (e.g., travel history, exposures)       No  Protocols used: Cough - Acute Productive-A-AH

## 2024-04-10 ENCOUNTER — Other Ambulatory Visit: Payer: Self-pay | Admitting: Pulmonary Disease

## 2024-04-10 DIAGNOSIS — J209 Acute bronchitis, unspecified: Secondary | ICD-10-CM

## 2024-04-10 NOTE — Telephone Encounter (Signed)
 Pt.notified

## 2024-04-13 ENCOUNTER — Telehealth: Payer: Self-pay

## 2024-04-13 ENCOUNTER — Other Ambulatory Visit: Payer: Self-pay

## 2024-04-13 DIAGNOSIS — J209 Acute bronchitis, unspecified: Secondary | ICD-10-CM

## 2024-04-13 MED ORDER — AZITHROMYCIN 250 MG PO TABS
ORAL_TABLET | ORAL | 0 refills | Status: AC
Start: 2024-04-13 — End: ?

## 2024-04-13 NOTE — Telephone Encounter (Signed)
 Copied from CRM [id number]. Topic: Clinical - Prescription Issue >> Apr 13, 2024  9:18 AM Juliana Ocean wrote: Reason for CRM: pt states she was to have Z pak called in to her pharmacy, but the Rx states "printed".  azithromycin  (ZITHROMAX ) 250 MG tablet  Sig: Take 2 tablets on day one then take 1 tablet daily for four additional days  Class: Prin  Sent 04/09/2024  Pt states she is not going to get better w/out this medication, and requesting it be sent asap.  [id number] [facility], [place] - [address] >> Apr 13, 2024 11:48 AM Chantha C wrote: Patient [phone] is upset that her  Zpak medication [facility] not [facility]. [facility] minutes [facility], and they do not have medication. Patient states this was suppose to be done three days ago and she called this morning. Patient wants med called into the pharmacy, since there's been so many issues of sending it via faxed. Patient wants a call back today. Please advise.   [id number] [facility], [place] - [address] [place] [id number]: [phone]  Fax: [phone]   Resending rx to pharm. Pt is aware. -*

## 2024-04-13 NOTE — Telephone Encounter (Signed)
 Copied from CRM [id number]. Topic: Clinical - Prescription Issue >> Apr 13, 2024  9:18 AM Juliana Ocean wrote: Reason for CRM: pt states she was to have Z pak called in to her pharmacy, but the Rx states "printed".  azithromycin  (ZITHROMAX ) 250 MG tablet  Sig: Take 2 tablets on day one then take 1 tablet daily for four additional days  Class: Print  Sent 04/09/2024  Pt states she is not going to get better w/out this medication, and requesting it be sent asap.  [id number] - [place], [place] - [address]

## 2024-06-12 ENCOUNTER — Other Ambulatory Visit: Payer: Self-pay | Admitting: Cardiology

## 2024-06-22 ENCOUNTER — Other Ambulatory Visit: Payer: Self-pay | Admitting: Cardiology

## 2024-06-25 NOTE — Procedures (Signed)
 The patient arrived to the [facility] with c/o mask leaks. The patient is currently using a Resmed F20 FFM. The patient was refitted with the [employer] Evora FFM (Small/Medium) at a BILEVEL pressure of 18/14cm H20. The patient tolerated the trial well, with no noted complications. The trial mask was given to the patient for home use.  -VB

## 2024-08-20 ENCOUNTER — Encounter (HOSPITAL_BASED_OUTPATIENT_CLINIC_OR_DEPARTMENT_OTHER): Payer: Self-pay | Admitting: Pulmonary Disease

## 2024-08-20 ENCOUNTER — Ambulatory Visit (INDEPENDENT_AMBULATORY_CARE_PROVIDER_SITE_OTHER): Payer: Self-pay | Admitting: Pulmonary Disease

## 2024-08-20 VITALS — BP 117/76 | HR 77 | Ht 62.5 in | Wt 257.5 lb

## 2024-08-20 DIAGNOSIS — E669 Obesity, unspecified: Secondary | ICD-10-CM

## 2024-08-20 DIAGNOSIS — R600 Localized edema: Secondary | ICD-10-CM

## 2024-08-20 DIAGNOSIS — Z23 Encounter for immunization: Secondary | ICD-10-CM | POA: Diagnosis not present

## 2024-08-20 DIAGNOSIS — I509 Heart failure, unspecified: Secondary | ICD-10-CM | POA: Diagnosis not present

## 2024-08-20 DIAGNOSIS — I4891 Unspecified atrial fibrillation: Secondary | ICD-10-CM

## 2024-08-20 DIAGNOSIS — E662 Morbid (severe) obesity with alveolar hypoventilation: Secondary | ICD-10-CM

## 2024-08-20 DIAGNOSIS — G4733 Obstructive sleep apnea (adult) (pediatric): Secondary | ICD-10-CM | POA: Diagnosis not present

## 2024-08-20 DIAGNOSIS — J9611 Chronic respiratory failure with hypoxia: Secondary | ICD-10-CM

## 2024-08-20 NOTE — Patient Instructions (Addendum)
  HAPPY BIRTHDAY !!  X flu shot  X Lower IPAP max to 18 cm  X check ONO on bipap/RA    VISIT SUMMARY: Today, you had a follow-up appointment to review your obstructive sleep apnea (OSA), obesity hypoventilation syndrome, chronic respiratory failure, congestive heart failure (CHF), and other health concerns. We discussed your recent respiratory issues, swelling in your feet and ankles, and your use of the BiPAP machine. Your weight has remained stable, and you continue to manage your daily activities well.  YOUR PLAN: -OBSTRUCTIVE SLEEP APNEA AND OBESITY HYPOVENTILATION SYNDROME WITH CHRONIC RESPIRATORY FAILURE WITH HYPOXIA: Obstructive sleep apnea (OSA) is a condition where your breathing stops and starts during sleep, and obesity hypoventilation syndrome is when poor breathing leads to low oxygen levels. Your chronic respiratory failure with hypoxia means your body isn't getting enough oxygen. We will reduce the maximum pressure on your BiPAP machine to 18 cm H2O and check your oxygen levels with pulse oximetry while you use the BiPAP.  -CONGESTIVE HEART FAILURE: Congestive heart failure (CHF) is when your heart doesn't pump blood as well as it should, leading to fluid buildup. You have been experiencing swelling in your feet and ankles, which may be related to low oxygen levels during sleep. We recommend contacting your cardiologist for an earlier appointment and checking your oxygen levels with pulse oximetry on the BiPAP.  -BILATERAL LOWER EXTREMITY EDEMA: Bilateral lower extremity edema is swelling in both legs. This has been worsening despite taking Lasix , and it may be linked to low oxygen levels during sleep. We suggest contacting your cardiologist for an earlier appointment and checking your oxygen levels with pulse oximetry on the BiPAP.  -ATRIAL FIBRILLATION: Atrial fibrillation (AFib) is an irregular and often rapid heart rate that can lead to poor blood flow. Your AFib is  intermittent and seems to be triggered by deep coughing episodes. No continuous AFib has been reported.  -OBESITY: Obesity is a condition where you have an excessive amount of body fat. Your weight has remained stable at 257 pounds, which is a positive sign as there has been no significant weight gain.  -GENERAL HEALTH MAINTENANCE: You requested a senior flu shot, which we will administer today to help protect you from the flu.  INSTRUCTIONS: Please contact your cardiologist to schedule an earlier appointment. We will also check your oxygen levels with pulse oximetry on the BiPAP machine. Continue using your BiPAP as directed and monitor any changes in your symptoms.                      Contains text generated by Abridge.                                 Contains text generated by Abridge.                                 Contains text generated by Abridge.

## 2024-08-20 NOTE — Progress Notes (Signed)
 Subjective:    Patient ID: Amy Riddle, female    DOB: June 09, 1946, 78 y.o.   MRN: [id number]   78 yo  Remote smoker with OSA/OHS & chronic respiratory failure, on BiPAP +2 L oxygen. CODE STATUS DNR   PMH - A-Fib - on eliquis , cardizem .  s/p  cardioversion   HTN - on losartan , lasix .  Prior LVEF 40-45% Asthma - on advair  DM II - on metformin  Morbid Obesity  Former Smoker - quit 1975   She was hospitalized 05/2020, acute hypercarbic respiratory failure, AKI and cor pulmonale   ABG  7.2 5/82/74 on 60% FiO2 she was discharged on 3 L of oxygen and trilogy Switched to BiPAP 11/2020 after sleep studies   Discussed the use of AI scribe software for clinical note transcription with the patient, who gave verbal consent to proceed.  History of Present Illness  Discussed the use of AI scribe software for clinical note transcription with the patient, who gave verbal consent to proceed.  History of Present Illness   Amy Riddle is a 78 year old female with OSA, obesity, and chronic respiratory failure with hypoxia who presents for a ten-month follow-up.  She experiences significant respiratory issues during colds, with a recent episode in late April persisting through May. Despite initial antibiotic treatment, symptoms improved only after a different regimen. Atrial fibrillation is occasionally triggered by deep coughing. Swelling in her feet and ankles has been present for three to four weeks, worsening with prolonged walking, despite taking 80 mg of Lasix . She continues to use her BiPAP machine since hospital discharge, with occasional unexpected pressure increases during sleep. Her weight remains stable at 257 pounds, down from 312 pounds, and she maintains a routine of daily cooking and adjusted household chores.         Significant tests/ events reviewed   Limited ECHO 05/2020 >> LVEF ~ 40-45%, LV mildly decreased function, LV demonstrates global hypokinesis,  interventricular septum flattened in systole consistent with RV pressure overload, RV systolic function moderately reduced, RV is severely enlarged, LA mildly dilated, RA severely dilated     NPSG 08/2020 mod OSA AHI 19/h with severe desatn , nadir 66%, required 2L O2   Titration 10/2020 >> BiPAP 20/16 , nadir 79% 01/2021 ONO on BiPAP/room air showed desaturation for 28 minutes on night #1 However on nights#2 and 3, minimal desaturation was noted  Review of Systems  neg for any significant sore throat, dysphagia, itching, sneezing, nasal congestion or excess/ purulent secretions, fever, chills, sweats, unintended wt loss, pleuritic or exertional cp, hempoptysis, orthopnea pnd or change in chronic leg swelling. Also denies presyncope, palpitations, heartburn, abdominal pain, nausea, vomiting, diarrhea or change in bowel or urinary habits, dysuria,hematuria, rash, arthralgias, visual complaints, headache, numbness weakness or ataxia.      Objective:   Physical Exam  Gen. Pleasant, obese, in no distress ENT - no lesions, no post nasal drip Neck: No JVD, no thyromegaly, no carotid bruits Lungs: no use of accessory muscles, no dullness to percussion, decreased without rales or rhonchi  Cardiovascular: Rhythm regular, heart sounds  normal, no murmurs or gallops, 1+ peripheral edema Musculoskeletal: No deformities, no cyanosis or clubbing , no tremors       Assessment & Plan:   Assessment and Plan Assessment & Plan   Assessment and Plan    Obstructive sleep apnea and obesity hypoventilation syndrome with chronic respiratory failure with hypoxia OSA and obesity hypoventilation syndrome managed with BiPAP. Chronic respiratory failure  with hypoxia previously treated with BiPAP and supplemental oxygen. Oxygen was removed from BiPAP setup. BiPAP download shows excellent control of events on auto settings with average usage of 5.5 hours per night and average BiPAP pressure of 15/11 cm H2O. This  has certainly helped her somnolence & fatigue - Reduce IPAP maximum to 18 cm H2O - Check oxygen level with pulse oximetry on BiPAP  Congestive heart failure CHF with recent bilateral lower extremity edema. Lasix  80 mg is being taken but edema persists.  Bilateral lower extremity edema Recent onset of bilateral lower extremity edema over the past month. Edema worsens throughout the day despite Lasix  use. Possible link to low oxygen levels during sleep. - Consider contacting cardiologist for earlier appointment - Check oxygen level with pulse oximetry on BiPAP  Atrial fibrillation Intermittent atrial fibrillation exacerbated by deep coughing episodes. No continuous AFib reported.  Obesity Weight is stable compared to last year at 257 pounds. No significant weight gain noted.  General Health Maintenance Requested senior flu shot. - Administer senior flu shot

## 2024-08-20 NOTE — Addendum Note (Signed)
 Addended by: TRUDY WARREN CROME on: 08/20/2024 11:38 AM   Modules accepted: Orders

## 2024-08-20 NOTE — Addendum Note (Signed)
 Addended by: TRUDY WARREN CROME on: 08/20/2024 11:54 AM   Modules accepted: Orders

## 2024-08-28 ENCOUNTER — Other Ambulatory Visit: Payer: Self-pay | Admitting: Cardiology

## 2024-08-28 DIAGNOSIS — I4821 Permanent atrial fibrillation: Secondary | ICD-10-CM

## 2024-08-28 NOTE — Telephone Encounter (Signed)
 Prescription refill request for Eliquis  received. Indication:afib Last office visit:11/07/23 [place] Scr: No recent labs Last done 2022 Age: 78 Weight: 257 lbs  Due to not having recent labs, will refer to pharmacy for advisement

## 2024-08-31 NOTE — Telephone Encounter (Signed)
 Labs overdue,. Will update at appt with Dr Lavona

## 2024-09-17 ENCOUNTER — Telehealth: Payer: Self-pay | Admitting: Pulmonary Disease

## 2024-09-17 NOTE — Telephone Encounter (Signed)
 Only 30 mins desat <88% OK to trial off O2 & see how she does & report back in  1 month

## 2024-09-18 ENCOUNTER — Telehealth (HOSPITAL_BASED_OUTPATIENT_CLINIC_OR_DEPARTMENT_OTHER): Payer: Self-pay

## 2024-09-18 NOTE — Telephone Encounter (Unsigned)
 Copied from CRM #[id number]. Topic: General - Other >> Sep 18, 2024  2:22 PM Celestine FALCON wrote: Reason for CRM: Pt missed a call from CMA Janesa Dockery at 215pm on 09/18/24 regarding a note from Dr. Jude on 09/17/2024 at 815am.  The note stated Only 30 mins desat <88% OK to trial off O2 & see how she does & report back in  1 month  Pt is confused because she states she hasn't been on any oxygen for at least a year. Pt stated she recently had some changes re calibrated on her BIPAP, but nothing with oxygen.  I tried to call CAL a few times with no success. Please call to clarify at [phone] ok to leave a vm.

## 2024-09-18 NOTE — Telephone Encounter (Signed)
Left pt message to return call 

## 2024-09-21 NOTE — Telephone Encounter (Signed)
Pt notified on VM. 

## 2024-10-14 ENCOUNTER — Other Ambulatory Visit: Payer: Self-pay | Admitting: Nurse Practitioner

## 2024-10-14 DIAGNOSIS — J209 Acute bronchitis, unspecified: Secondary | ICD-10-CM

## 2024-10-23 LAB — LAB REPORT - SCANNED
A1c: 6
Albumin, Urine POC: 127.8
Creatinine, POC: 201.3 mg/dL
EGFR: 56
Microalb Creat Ratio: 63

## 2024-11-11 ENCOUNTER — Other Ambulatory Visit (HOSPITAL_COMMUNITY): Payer: Self-pay

## 2024-11-11 ENCOUNTER — Encounter: Payer: Self-pay | Admitting: Cardiology

## 2024-11-11 ENCOUNTER — Ambulatory Visit: Payer: Self-pay | Attending: Cardiology | Admitting: Cardiology

## 2024-11-11 VITALS — BP 134/90 | HR 86 | Ht 62.0 in | Wt 260.0 lb

## 2024-11-11 DIAGNOSIS — I5042 Chronic combined systolic (congestive) and diastolic (congestive) heart failure: Secondary | ICD-10-CM | POA: Diagnosis not present

## 2024-11-11 DIAGNOSIS — I4821 Permanent atrial fibrillation: Secondary | ICD-10-CM | POA: Diagnosis not present

## 2024-11-11 MED ORDER — TORSEMIDE 20 MG PO TABS
40.0000 mg | ORAL_TABLET | Freq: Every day | ORAL | 3 refills | Status: DC
  Filled 2024-11-11: qty 180, 90d supply, fill #0

## 2024-11-11 MED ORDER — LOSARTAN POTASSIUM 50 MG PO TABS
50.0000 mg | ORAL_TABLET | Freq: Every day | ORAL | 3 refills | Status: DC
  Filled 2024-11-11: qty 90, 90d supply, fill #0

## 2024-11-11 MED ORDER — TORSEMIDE 20 MG PO TABS: 40.0000 mg | ORAL_TABLET | Freq: Every day | ORAL | 3 refills | Status: AC

## 2024-11-11 MED ORDER — LOSARTAN POTASSIUM 50 MG PO TABS: 50.0000 mg | ORAL_TABLET | Freq: Every day | ORAL | 3 refills | Status: AC

## 2024-11-11 NOTE — Progress Notes (Signed)
 Cardiology Office Note:   Date:  11/11/2024  ID:  Brynnan, Rodenbaugh Nov 17, 1946, MRN [id number] PCP: Loring Tanda Mae, MD  [facility] Providers Cardiologist:  Lynwood Schilling, MD {  History of Present Illness:   Amy Riddle is a 78 y.o. female who presents for follow up of SOB and atrial fib.  She was in the hospital in June 2021 with respiratory distress.   She was treated for multifactorial respiratory failure.  She had sleep apnea and hypoventilation obesity syndrome.  She was also thought to have acute systolic and diastolic HF.    Her EF was 45 - 50% most recently in Sept 2024.  This was not changed from previous.      Since I last saw her she has had some increased lower extremity swelling.  This has been for about 3 to 4 months.  She does get a little short of breath occasionally with activities but this is not always reproducible.  She has 7 stairs in her house and sometimes has to rest at the top.  She is watching her salt.  She is watching her fluid.  She is not having any new palpitations and does not really notice her fibrillation.  She is not having any presyncope or syncope.  Her weights have gone up a little bit.  Her primary provider did stop her Cardizem  recently to see if that would help with the swelling.  For some reason also she has not been taking her losartan .  She does take her beta-blocker.    ROS: As stated in the HPI and negative for all other systems.  Studies Reviewed:    EKG:   EKG Interpretation Date/Time:  Wednesday November 11 2024 10:19:04 EST Ventricular Rate:  86 PR Interval:    QRS Duration:  98 QT Interval:  374 QTC Calculation: 447 R Axis:   95  Text Interpretation: Atrial fibrillation with premature ventricular or aberrantly conducted complexes Rightward axis When compared with ECG of 07-Nov-2023 10:27, ectopy is new Confirmed by Schilling Lynwood ([id number]) on 11/11/2024 10:27:48 AM     Risk Assessment/Calculations:     CHA2DS2-VASc Score = 6   This indicates a 9.7% annual risk of stroke. The patient's score is based upon: CHF History: 1 HTN History: 1 Diabetes History: 1 Stroke History: 0 Vascular Disease History: 0 Age Score: 2 Gender Score: 1   Physical Exam:   VS:  BP (!) 134/90   Pulse 86   Ht 5' 2 (1.575 m)   Wt 260 lb (117.9 kg)   SpO2 93%   BMI 47.55 kg/m    Wt Readings from Last 3 Encounters:  11/11/24 260 lb (117.9 kg)  08/20/24 257 lb 8 oz (116.8 kg)  11/07/23 246 lb (111.6 kg)     GEN: Well nourished, well developed in no acute distress NECK: No JVD; No carotid bruits CARDIAC: Irregular RR, no murmurs, rubs, gallops RESPIRATORY:  Clear to auscultation without rales, wheezing or rhonchi  ABDOMEN: Soft, non-tender, non-distended EXTREMITIES:  No edema; No deformity   ASSESSMENT AND PLAN:   CHRONIC SYSTOLIC AND DIASTOLIC HF:     She does have some increased lower extremity swelling and I need to switch her from furosemide  to torsemide .  I will check a basic metabolic profile in a couple of weeks at her primary care office.  I will check a follow-up echocardiogram.  She is gena continue the beta-blocker.  I encouraged her to go back on the Cozaar  as  I do not know what happened to this.  I will be following up with the basic metabolic profile as mentioned.   CHRONIC ATRIAL FIB:  Ms. ARLETH MCCULLAR has a CHA2DS2 - VASc score of 4.   She tolerates anticoagulation.  She will keep an eye on her rate control now that she is off the Cardizem .   SLEEP APNEA:   She uses BiPAP.     DM: I do see that her A1c was 6.0 and followed by her primary provider.      Follow up with me in about 3 to 4 months.  Signed, Lynwood Schilling, MD

## 2024-11-11 NOTE — Patient Instructions (Signed)
 Medication Instructions:  Stop Lasix  Start Torsemide  40 mg once daily *If you need a refill on your cardiac medications before your next appointment, please call your pharmacy*  Lab Work: BMET at Primary care provider's office in 2 weeks If you have labs (blood work) drawn today and your tests are completely normal, you will receive your results only by: MyChart Message (if you have MyChart) OR A paper copy in the mail If you have any lab test that is abnormal or we need to change your treatment, we will call you to review the results.  Testing/Procedures: Echocardiogram Your physician has requested that you have an echocardiogram. Echocardiography is a painless test that uses sound waves to create images of your heart. It provides your doctor with information about the size and shape of your heart and how well your hearts chambers and valves are working. This procedure takes approximately one hour. There are no restrictions for this procedure. Please do NOT wear cologne, perfume, aftershave, or lotions (deodorant is allowed). Please arrive 15 minutes prior to your appointment time.  Please note: We ask at that you not bring children with you during ultrasound (echo/ vascular) testing. Due to room size and safety concerns, children are not allowed in the ultrasound rooms during exams. Our front office staff cannot provide observation of children in our lobby area while testing is being conducted. An adult accompanying a patient to their appointment will only be allowed in the ultrasound room at the discretion of the ultrasound technician under special circumstances. We apologize for any inconvenience.   Follow-Up: At [facility], you and your health needs are our priority.  As part of our continuing mission to provide you with exceptional heart care, our providers are all part of one team.  This team includes your primary Cardiologist (physician) and Advanced Practice Providers or  APPs (Physician Assistants and Nurse Practitioners) who all work together to provide you with the care you need, when you need it.  Your next appointment:   3 month(s)  Provider:   Lynwood Schilling, MD    We recommend signing up for the patient portal called MyChart.  Sign up information is provided on this After Visit Summary.  MyChart is used to connect with patients for Virtual Visits (Telemedicine).  Patients are able to view lab/test results, encounter notes, upcoming appointments, etc.  Non-urgent messages can be sent to your provider as well.   To learn more about what you can do with MyChart, go to [email or web address].

## 2024-11-12 ENCOUNTER — Telehealth: Payer: Self-pay | Admitting: Cardiology

## 2024-11-12 DIAGNOSIS — I4891 Unspecified atrial fibrillation: Secondary | ICD-10-CM

## 2024-11-12 NOTE — Telephone Encounter (Signed)
 Spoke with [facility] inquiring if this is still patient's PCP. Fax number given and sending lab order to PCP.

## 2024-11-12 NOTE — Telephone Encounter (Signed)
 Pt called in stating she is supposed to have BMET done in 2 weeks with PCP. She states they need an order faxed over to their office. Please advise.

## 2024-12-07 ENCOUNTER — Other Ambulatory Visit: Payer: Self-pay | Admitting: Cardiology

## 2024-12-20 ENCOUNTER — Other Ambulatory Visit: Payer: Self-pay | Admitting: Cardiology

## 2024-12-20 DIAGNOSIS — I4821 Permanent atrial fibrillation: Secondary | ICD-10-CM

## 2024-12-23 ENCOUNTER — Ambulatory Visit (HOSPITAL_COMMUNITY): Payer: Self-pay

## 2025-01-20 ENCOUNTER — Ambulatory Visit (HOSPITAL_COMMUNITY): Payer: Self-pay

## 2025-02-18 ENCOUNTER — Ambulatory Visit: Payer: Self-pay | Admitting: Cardiology
